# Patient Record
Sex: Female | Born: 1975 | Race: Black or African American | Hispanic: No | Marital: Single | Smoking: Never smoker
Health system: Southern US, Community
[De-identification: ages and names within clinical notes are randomized; demographics above are authoritative.]

## PROBLEM LIST (undated history)

## (undated) ENCOUNTER — Inpatient Hospital Stay (HOSPITAL_COMMUNITY): Payer: Self-pay

## (undated) DIAGNOSIS — E119 Type 2 diabetes mellitus without complications: Secondary | ICD-10-CM

## (undated) DIAGNOSIS — L732 Hidradenitis suppurativa: Secondary | ICD-10-CM

## (undated) DIAGNOSIS — R21 Rash and other nonspecific skin eruption: Secondary | ICD-10-CM

## (undated) DIAGNOSIS — E041 Nontoxic single thyroid nodule: Secondary | ICD-10-CM

## (undated) DIAGNOSIS — M1990 Unspecified osteoarthritis, unspecified site: Secondary | ICD-10-CM

## (undated) DIAGNOSIS — O139 Gestational [pregnancy-induced] hypertension without significant proteinuria, unspecified trimester: Secondary | ICD-10-CM

## (undated) DIAGNOSIS — O24419 Gestational diabetes mellitus in pregnancy, unspecified control: Secondary | ICD-10-CM

## (undated) DIAGNOSIS — K219 Gastro-esophageal reflux disease without esophagitis: Secondary | ICD-10-CM

## (undated) HISTORY — DX: Type 2 diabetes mellitus without complications: E11.9

## (undated) HISTORY — DX: Gestational diabetes mellitus in pregnancy, unspecified control: O24.419

## (undated) HISTORY — DX: Hidradenitis suppurativa: L73.2

---

## 1995-04-29 HISTORY — PX: ELBOW ARTHROSCOPY: SHX614

## 1997-12-08 ENCOUNTER — Ambulatory Visit (HOSPITAL_COMMUNITY): Admission: RE | Admit: 1997-12-08 | Discharge: 1997-12-08 | Payer: Self-pay | Admitting: Family Medicine

## 1998-08-15 ENCOUNTER — Inpatient Hospital Stay (HOSPITAL_COMMUNITY): Admission: AD | Admit: 1998-08-15 | Discharge: 1998-08-15 | Payer: Self-pay | Admitting: Obstetrics & Gynecology

## 1998-11-25 ENCOUNTER — Emergency Department (HOSPITAL_COMMUNITY): Admission: EM | Admit: 1998-11-25 | Discharge: 1998-11-25 | Payer: Self-pay | Admitting: Emergency Medicine

## 2000-04-02 ENCOUNTER — Emergency Department (HOSPITAL_COMMUNITY): Admission: EM | Admit: 2000-04-02 | Discharge: 2000-04-03 | Payer: Self-pay | Admitting: Emergency Medicine

## 2002-04-05 ENCOUNTER — Encounter: Payer: Self-pay | Admitting: Emergency Medicine

## 2002-04-05 ENCOUNTER — Emergency Department (HOSPITAL_COMMUNITY): Admission: EM | Admit: 2002-04-05 | Discharge: 2002-04-05 | Payer: Self-pay | Admitting: *Deleted

## 2004-08-05 ENCOUNTER — Emergency Department (HOSPITAL_COMMUNITY): Admission: EM | Admit: 2004-08-05 | Discharge: 2004-08-05 | Payer: Self-pay | Admitting: Emergency Medicine

## 2005-02-07 ENCOUNTER — Inpatient Hospital Stay (HOSPITAL_COMMUNITY): Admission: AD | Admit: 2005-02-07 | Discharge: 2005-02-07 | Payer: Self-pay | Admitting: Obstetrics and Gynecology

## 2005-02-17 ENCOUNTER — Other Ambulatory Visit: Admission: RE | Admit: 2005-02-17 | Discharge: 2005-02-17 | Payer: Self-pay | Admitting: Obstetrics and Gynecology

## 2005-03-13 ENCOUNTER — Ambulatory Visit (HOSPITAL_COMMUNITY): Admission: RE | Admit: 2005-03-13 | Discharge: 2005-03-13 | Payer: Self-pay | Admitting: Obstetrics and Gynecology

## 2005-08-22 ENCOUNTER — Ambulatory Visit (HOSPITAL_COMMUNITY): Admission: RE | Admit: 2005-08-22 | Discharge: 2005-08-22 | Payer: Self-pay | Admitting: Obstetrics and Gynecology

## 2005-09-03 ENCOUNTER — Inpatient Hospital Stay (HOSPITAL_COMMUNITY): Admission: AD | Admit: 2005-09-03 | Discharge: 2005-09-06 | Payer: Self-pay | Admitting: Obstetrics and Gynecology

## 2009-05-19 ENCOUNTER — Emergency Department (HOSPITAL_BASED_OUTPATIENT_CLINIC_OR_DEPARTMENT_OTHER): Admission: EM | Admit: 2009-05-19 | Discharge: 2009-05-19 | Payer: Self-pay | Admitting: Emergency Medicine

## 2009-05-19 ENCOUNTER — Ambulatory Visit: Payer: Self-pay | Admitting: Diagnostic Radiology

## 2009-11-05 ENCOUNTER — Emergency Department (HOSPITAL_BASED_OUTPATIENT_CLINIC_OR_DEPARTMENT_OTHER)
Admission: EM | Admit: 2009-11-05 | Discharge: 2009-11-05 | Payer: Self-pay | Source: Home / Self Care | Admitting: Emergency Medicine

## 2010-07-15 LAB — CBC
HCT: 41.4 % (ref 36.0–46.0)
Hemoglobin: 13.7 g/dL (ref 12.0–15.0)
MCHC: 33.1 g/dL (ref 30.0–36.0)
Platelets: 277 10*3/uL (ref 150–400)
WBC: 6 10*3/uL (ref 4.0–10.5)

## 2010-07-15 LAB — BASIC METABOLIC PANEL
Chloride: 106 mEq/L (ref 96–112)
GFR calc Af Amer: >60 mL/min (ref >60)
GFR calc non Af Amer: >60 mL/min (ref >60)
Potassium: 3.6 mEq/L (ref 3.5–5.1)

## 2010-07-15 LAB — POCT CARDIAC MARKERS: CKMB, poc: <1 ng/mL — ABNORMAL LOW (ref 1.0–8.0)

## 2010-09-13 NOTE — H&P (Signed)
[name], LINES               ACCOUNT NO.:  [id number]   MEDICAL RECORD NO.:  [id number]          PATIENT TYPE:  MAT   LOCATION:  [facility]                          FACILITY:  [facility]   PHYSICIAN:  Charles A. Delcambre, MDDATE OF BIRTH:  1975-12-01   DATE OF ADMISSION:  DATE OF DISCHARGE:                                HISTORY & PHYSICAL   REASON FOR ADMISSION:  The patient will be admitted on Sep 17, 2005 to  undergo induction of labor at 40 weeks and 2 days estimated gestational age  secondary to gestational diabetes, diet-controlled, with good control, with  favorable cervix.   HISTORY OF PRESENT ILLNESS:  She is a 35 year old para 0-0-1-0, [facility] Sep 15, 2005, noted to be 3 cm dilated in the office today, Sep 02, 2005.  She notes  active fetal movement.  Sugars have been well-controlled, with two hours  postprandials in the 80 to 90 to 100 range and occasional 100-teens and an  occasional 130-140, but these are rare and usually with a dietary  indiscretion.  She has had normal antenatal testing with biophysical once a  week alternating with an NST once a week.  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   PRENATAL LABS:  A positive.  Antibody screen negative.  Sickle trait  negative.  VDRL negative, nonreactive.  Rubella immune.  Hepatitis B surface  antigen negative.  HIV negative.  TSH normal.  CC and chlamydia cultures  negative.  Pap ASCUS.  Quad screen negative.  One-hour Glucola 162.  Abnormal three-hour glucose tolerance test at 102/195/171/125.  Hemoglobin  at 26 weeks was 12.2.  Group B strep positive at 36 weeks.   PAST MEDICAL HISTORY:  None.   PAST SURGICAL HISTORY:  None.   MEDICATIONS:  1.  Valtrex 500 mg per day.  2.  Prenatal vitamins once a day.   ALLERGIES:  PREVACID, REACTION NOT SPECIFIED.   SOCIAL HISTORY:  No tobacco or recreational drug abuse.  The patient is  married.   FAMILY HISTORY:  Heart disease and diabetes in her father.   REVIEW OF SYSTEMS:  She notes active fetal movement today, occasional  contractions.  No rupture of membranes or bleeding.  She had occasional  spotting, none currently.   PHYSICAL EXAMINATION:  GENERAL:  Alert and oriented x3.  No distress.  VITAL SIGNS:  Blood pressure 120/60, weight 249 pounds, respirations 18,  pulse 90.  HEENT:  Grossly within normal limits.  NECK:  Supple, without thyromegaly or adenopathy.  LUNGS:  Clear bilaterally.  BREASTS:  No masses, tenderness, discharge, or nipple changes bilaterally.  ABDOMEN:  Soft, gravid with a 38-cm fundal height.  Estimated fetal weight  3500 g to 3600 g.  PELVIC:  Cervix 3 cm dilated, 75% effaced, -1 station.  Posterior, soft.  EXTREMITIES:  Minimal edema bilaterally.  Nontender.   ASSESSMENT:  Intrauterine pregnancy at 40 weeks and 2 days estimated  gestational age at the time of induction with  gestational diabetes which is  well-controlled with diet, for induction.   PLAN:  Pitocin induction.  Positive for group B strep.  She will be treated  with penicillin 5 mL initially and then 2.5 mL every four hours.  Anticipate  vaginal birth.  Continue Valtrex up through time of delivery.  If any  prodromal symptoms of HSV or outbreak, will proceed with cesarean section  primarily.  All questions were answered, and we will proceed as outlined.      Charles A. Sydnee Cabal, MD  Electronically Signed     CAD/MEDQ  D:  09/02/2005  T:  09/02/2005  Job:  [id number]

## 2011-03-13 ENCOUNTER — Encounter: Payer: Self-pay | Admitting: *Deleted

## 2011-03-13 ENCOUNTER — Emergency Department (HOSPITAL_BASED_OUTPATIENT_CLINIC_OR_DEPARTMENT_OTHER)
Admission: EM | Admit: 2011-03-13 | Discharge: 2011-03-13 | Disposition: A | Discharge status: Home / Self Care | Payer: 59 | Attending: Emergency Medicine | Admitting: Emergency Medicine

## 2011-03-13 ENCOUNTER — Emergency Department (INDEPENDENT_AMBULATORY_CARE_PROVIDER_SITE_OTHER): Payer: 59

## 2011-03-13 DIAGNOSIS — R109 Unspecified abdominal pain: Secondary | ICD-10-CM

## 2011-03-13 DIAGNOSIS — K8020 Calculus of gallbladder without cholecystitis without obstruction: Secondary | ICD-10-CM | POA: Insufficient documentation

## 2011-03-13 DIAGNOSIS — K8050 Calculus of bile duct without cholangitis or cholecystitis without obstruction: Secondary | ICD-10-CM

## 2011-03-13 DIAGNOSIS — R7402 Elevation of levels of lactic acid dehydrogenase (LDH): Secondary | ICD-10-CM | POA: Insufficient documentation

## 2011-03-13 DIAGNOSIS — R1084 Generalized abdominal pain: Secondary | ICD-10-CM | POA: Insufficient documentation

## 2011-03-13 DIAGNOSIS — R748 Abnormal levels of other serum enzymes: Secondary | ICD-10-CM

## 2011-03-13 DIAGNOSIS — M549 Dorsalgia, unspecified: Secondary | ICD-10-CM

## 2011-03-13 DIAGNOSIS — R7401 Elevation of levels of liver transaminase levels: Secondary | ICD-10-CM | POA: Insufficient documentation

## 2011-03-13 DIAGNOSIS — K219 Gastro-esophageal reflux disease without esophagitis: Secondary | ICD-10-CM | POA: Insufficient documentation

## 2011-03-13 LAB — PREGNANCY, URINE: Preg Test, Ur: NEGATIVE

## 2011-03-13 LAB — URINALYSIS, ROUTINE W REFLEX MICROSCOPIC
Bilirubin Urine: NEGATIVE
Leukocytes, UA: NEGATIVE
Nitrite: NEGATIVE
Specific Gravity, Urine: 1.027 (ref 1.005–1.030)

## 2011-03-13 LAB — URINE MICROSCOPIC-ADD ON

## 2011-03-13 LAB — COMPREHENSIVE METABOLIC PANEL
ALT: 168 U/L — ABNORMAL HIGH (ref 0–35)
AST: 268 U/L — ABNORMAL HIGH (ref 0–37)
Alkaline Phosphatase: 122 U/L — ABNORMAL HIGH (ref 39–117)
CO2: 25 mEq/L (ref 19–32)
Calcium: 9.7 mg/dL (ref 8.4–10.5)
Chloride: 102 mEq/L (ref 96–112)
GFR calc Af Amer: >90 mL/min (ref >90)
Glucose, Bld: 116 mg/dL — ABNORMAL HIGH (ref 70–99)
Potassium: 3.8 mEq/L (ref 3.5–5.1)

## 2011-03-13 LAB — DIFFERENTIAL
Eosinophils Absolute: 0 10*3/uL (ref 0.0–0.7)
Lymphs Abs: 1.4 10*3/uL (ref 0.7–4.0)
Monocytes Relative: 8 % (ref 3–12)
Neutrophils Relative %: 80 % — ABNORMAL HIGH (ref 43–77)

## 2011-03-13 LAB — CBC
HCT: 39.4 % (ref 36.0–46.0)
MCHC: 34 g/dL (ref 30.0–36.0)
Platelets: 273 10*3/uL (ref 150–400)
RDW: 12.9 % (ref 11.5–15.5)
WBC: 11.5 10*3/uL — ABNORMAL HIGH (ref 4.0–10.5)

## 2011-03-13 LAB — LIPASE, BLOOD: Lipase: 31 U/L (ref 11–59)

## 2011-03-13 MED ORDER — HYDROCODONE-ACETAMINOPHEN 5-325 MG PO TABS: 1.0000 | ORAL_TABLET | ORAL | Status: AC | PRN

## 2011-03-13 MED ORDER — ONDANSETRON HCL 4 MG/2ML IJ SOLN
4.0000 mg | Freq: Once | INTRAMUSCULAR | Status: AC
  Administered 2011-03-13: 4 mg via INTRAVENOUS
  Filled 2011-03-13: qty 2

## 2011-03-13 MED ORDER — HYDROMORPHONE HCL PF 1 MG/ML IJ SOLN
1.0000 mg | Freq: Once | INTRAMUSCULAR | Status: AC
  Administered 2011-03-13: 1 mg via INTRAVENOUS
  Filled 2011-03-13: qty 1

## 2011-03-13 NOTE — ED Provider Notes (Signed)
History     CSN: [id number] Arrival date & time: 03/13/2011  9:35 AM   First MD Initiated Contact with Patient 03/13/11 1059      Chief Complaint  Patient presents with  . Abdominal Pain    (Consider location/radiation/quality/duration/timing/severity/associated sxs/prior treatment) Patient is a 35 y.o. female presenting with abdominal pain. The history is provided by the patient.  Abdominal Pain The primary symptoms of the illness include abdominal pain.   the pain started suddenly at 0800. It is located in the epigastric area with radiation to the back. Pain was initially sharp, but is now crampy. Pain was initially 10/10, but is now down to 7/10. She took a dose of TUMS with slight relief. She has a history of GERD, but this pain is different from the pain she had with GERD. There is no associated nausea or vomiting. There is no associated constipation or diarrhea. She describes symptoms as severe. The pain started after eating some bran. She does not have any history of abdominal surgery.  Past Medical History  Diagnosis Date  . Acid reflux     No past surgical history on file.  No family history on file.  History  Substance Use Topics  . Smoking status: Not on file  . Smokeless tobacco: Not on file  . Alcohol Use:     OB History    Grav Para Term Preterm Abortions TAB SAB Ect Mult Living                  Review of Systems  Gastrointestinal: Positive for abdominal pain.  All other systems reviewed and are negative.    Allergies  Review of patient's allergies indicates no known allergies.  Home Medications  No current outpatient prescriptions on file.  BP 140/73  Pulse 78  Temp(Src) 97.8 F (36.6 C) (Oral)  Ht 5\' 9"  (1.753 m)  SpO2 100%  Physical Exam  Nursing note and vitals reviewed.  35 year old female who is resting comfortably and is in no acute distress. Vital signs are normal. Head is normocephalic and atraumatic. PERRLA, EOMI. There is no  scleral icterus. Oropharynx is clear. Neck is supple without adenopathy or JVD. Back is nontender there is no CVA tenderness. Lungs are clear without any rales, wheezes, or rhonchi. Heart has a regular rate and rhythm without murmur. Abdomen is slightly obese, and soft. There is moderate tenderness in the epigastrium and right upper quadrant with a plus/minus Murphy sign. There is no hepatosplenomegaly. Peristalsis is diminished but present. Extremities have no cyanosis or edema, full range of motion present. Neurologic: Mental status is normal, cranial nerves are intact, there are no motor or sensory deficits. Psychiatric: No abnormalities of mood or affect.  ED Course  Procedures (including critical care time) Results for orders placed during the hospital encounter of 03/13/11  COMPREHENSIVE METABOLIC PANEL      Component Value Range   Sodium 138  135 - 145 (mEq/L)   Potassium 3.8  3.5 - 5.1 (mEq/L)   Chloride 102  96 - 112 (mEq/L)   CO2 25  19 - 32 (mEq/L)   Glucose, Bld 116 (*) 70 - 99 (mg/dL)   BUN 8  6 - 23 (mg/dL)   Creatinine, Ser 4.09  0.50 - 1.10 (mg/dL)   Calcium 9.7  8.4 - 81.1 (mg/dL)   Total Protein 8.2  6.0 - 8.3 (g/dL)   Albumin 4.1  3.5 - 5.2 (g/dL)   AST 914 (*) 0 - 37 (U/L)  ALT 168 (*) 0 - 35 (U/L)   Alkaline Phosphatase 122 (*) 39 - 117 (U/L)   Total Bilirubin 1.2  0.3 - 1.2 (mg/dL)   GFR calc non Af Amer >90  >90 (mL/min)   GFR calc Af Amer >90  >90 (mL/min)  LIPASE, BLOOD      Component Value Range   Lipase 31  11 - 59 (U/L)  URINALYSIS, ROUTINE W REFLEX MICROSCOPIC      Component Value Range   Color, Urine YELLOW  YELLOW    Appearance CLOUDY (*) CLEAR    Specific Gravity, Urine 1.027  1.005 - 1.030    pH 6.5  5.0 - 8.0    Glucose, UA 500 (*) NEGATIVE (mg/dL)   Hgb urine dipstick SMALL (*) NEGATIVE    Bilirubin Urine NEGATIVE  NEGATIVE    Ketones, ur NEGATIVE  NEGATIVE (mg/dL)   Protein, ur NEGATIVE  NEGATIVE (mg/dL)   Urobilinogen, UA 1.0  0.0 - 1.0  (mg/dL)   Nitrite NEGATIVE  NEGATIVE    Leukocytes, UA NEGATIVE  NEGATIVE   PREGNANCY, URINE      Component Value Range   Preg Test, Ur NEGATIVE    URINE MICROSCOPIC-ADD ON      Component Value Range   Squamous Epithelial / LPF RARE  RARE    WBC, UA 0-2  <3 (WBC/hpf)   RBC / HPF 0-2  <3 (RBC/hpf)   Bacteria, UA FEW (*) RARE   CBC      Component Value Range   WBC 11.5 (*) 4.0 - 10.5 (K/uL)   RBC 4.66  3.87 - 5.11 (MIL/uL)   Hemoglobin 13.4  12.0 - 15.0 (g/dL)   HCT 16.1  09.6 - 04.5 (%)   MCV 84.5  78.0 - 100.0 (fL)   MCH 28.8  26.0 - 34.0 (pg)   MCHC 34.0  30.0 - 36.0 (g/dL)   RDW 40.9  81.1 - 91.4 (%)   Platelets 273  150 - 400 (K/uL)  DIFFERENTIAL      Component Value Range   Neutrophils Relative 80 (*) 43 - 77 (%)   Neutro Abs 9.2 (*) 1.7 - 7.7 (K/uL)   Lymphocytes Relative 12  12 - 46 (%)   Lymphs Abs 1.4  0.7 - 4.0 (K/uL)   Monocytes Relative 8  3 - 12 (%)   Monocytes Absolute 0.9  0.1 - 1.0 (K/uL)   Eosinophils Relative 0  0 - 5 (%)   Eosinophils Absolute 0.0  0.0 - 0.7 (K/uL)   Basophils Relative 0  0 - 1 (%)   Basophils Absolute 0.0  0.0 - 0.1 (K/uL)   US Abdomen Complete  03/13/2011  *RADIOLOGY REPORT*  Clinical Data:  Generalized abdominal pain radiating to the back  COMPLETE ABDOMINAL ULTRASOUND  Comparison:  None  Findings:  Gallbladder:  The gallbladder is contracted.  There is shadowing in the gallbladder with echogenic foci within the gallbladder consistent with multiple small gallstones.  There is no pain over gallbladder with compression.  Common bile duct:  The common bile duct is within upper limits of normal measuring 5 mm in diameter.  Liver:  The liver is somewhat echogenic which may indicate fatty infiltration.  No focal abnormality is seen.  IVC:  The IVC is partially obscured by bowel gas.  Pancreas:  The pancreas also is partially obscured by bowel gas.  Spleen:  The spleen is not well seen and cannot be measured.  Right Kidney:  No hydronephrosis is  seen.  The right kidney measures 9.6 cm sagittally.  Left Kidney:  No hydronephrosis and the left kidney measures 10.1 cm.  Abdominal aorta:  The abdominal aorta is partially obscured by bowel gas.  IMPRESSION:  1.  Contracted gallbladder with echogenicity most consistent with multiple small gallstones.  No other evidence of acute cholecystitis by ultrasound. 2.  Echogenic liver may indicate fatty infiltration. 3.  Bowel gas obscures much of the remainder of the anatomy.  Original Report Authenticated By: Juline Patch, M.D.      Labs Reviewed  CBC  DIFFERENTIAL  COMPREHENSIVE METABOLIC PANEL  LIPASE, BLOOD  URINALYSIS, ROUTINE W REFLEX MICROSCOPIC  PREGNANCY, URINE   No results found.   No diagnosis found.  She got very good pain relief from the allotted and Zofran. She has mild elevation of transaminases, but no evidence of acute cholecystitis. She will be discharged with referral to general surgery. She is advised that symptoms may worsen and may require emergent surgery. Patient expresses understanding.  MDM  Upper abdominal pain pus possibly related to biliary colic. She needs to have laboratory workup to rule out pancreatitis. Ultrasound has been ordered to rule out cholelithiasis.        Dione Booze, MD 03/13/11 1332

## 2011-03-13 NOTE — ED Notes (Signed)
Patient states she developed sudden onset of diffuse abdominal pain while at work approximately 2 hours ago.  Took tums with no relief.  Past history of acid reflux.  #22 G saline lock placed in right ac pta.

## 2011-03-13 NOTE — ED Notes (Signed)
MD at bedside. 

## 2011-03-13 NOTE — ED Notes (Signed)
Per nurse Amy B. I removed patient's I.V. And took last set of vitals.

## 2011-03-26 ENCOUNTER — Ambulatory Visit (INDEPENDENT_AMBULATORY_CARE_PROVIDER_SITE_OTHER): Payer: 59 | Admitting: General Surgery

## 2011-03-31 ENCOUNTER — Ambulatory Visit (INDEPENDENT_AMBULATORY_CARE_PROVIDER_SITE_OTHER): Payer: 59 | Admitting: General Surgery

## 2011-03-31 ENCOUNTER — Encounter (INDEPENDENT_AMBULATORY_CARE_PROVIDER_SITE_OTHER): Payer: Self-pay | Admitting: General Surgery

## 2011-03-31 ENCOUNTER — Other Ambulatory Visit (INDEPENDENT_AMBULATORY_CARE_PROVIDER_SITE_OTHER): Payer: Self-pay | Admitting: General Surgery

## 2011-03-31 VITALS — BP 128/86 | HR 64 | Temp 97.2°F | Resp 16 | Ht 68.5 in | Wt 230.1 lb

## 2011-03-31 DIAGNOSIS — K8020 Calculus of gallbladder without cholecystitis without obstruction: Secondary | ICD-10-CM

## 2011-03-31 NOTE — Progress Notes (Signed)
Patient ID: Yvonne Fisher, female   DOB: [birth year], 35 y.o.   MRN: [id number]  Chief Complaint  Patient presents with  . New Evaluation    eval of GB with stones     HPI Yvonne Fisher is a 35 y.o. female.    She is referred to me by Dr. Preston Fleeting in the emergency department.  About 3 weeks ago she was at work in the early morning after eating breakfast, she developed epigastric pain which was severe. She became diaphoretic. The pain radiated to her back. She denied nausea vomiting fever chills or diarrhea. She went to the emergency room. Dr. Preston Fleeting work her up. She is found to have a white blood cell count of 11,500, AST 268, ALT 168, total bilirubin 1.2, lipase 21, urine pregnancy test negative. Ultrasound showed a contracted gallbladder with stones, fatty infiltration of the liver, and a normal common bile duct.  She may have had one similar episode previously and she has had one very light episodes since.  She works in a [employer] and has to lift heavy objects. She takes no medications. She is allergic to Prevacid which causes times. There was surgery she's had his elbow operation. HPI  Past Medical History  Diagnosis Date  . Acid reflux     Past Surgical History  Procedure Date  . Elbow arthroscopy 1997    right elbow    History reviewed. No pertinent family history.  Social History History  Substance Use Topics  . Smoking status: Not on file  . Smokeless tobacco: Never Used  . Alcohol Use: No    Allergies  Allergen Reactions  . Prevacid     Hives      No current outpatient prescriptions on file.    Review of Systems Review of Systems  Constitutional: Negative for fever, chills and unexpected weight change.  HENT: Negative for hearing loss, congestion, sore throat, trouble swallowing and voice change.   Eyes: Negative for visual disturbance.  Respiratory: Positive for wheezing. Negative for cough.   Cardiovascular: Negative for chest pain, palpitations and  leg swelling.  Gastrointestinal: Positive for abdominal pain and constipation. Negative for nausea, vomiting, diarrhea, blood in stool, abdominal distention and anal bleeding.  Genitourinary: Negative for hematuria, vaginal bleeding and difficulty urinating.  Musculoskeletal: Negative for arthralgias.  Skin: Negative for rash and wound.  Neurological: Negative for seizures, syncope and headaches.  Hematological: Negative for adenopathy. Does not bruise/bleed easily.  Psychiatric/Behavioral: Negative for confusion.    Blood pressure 128/86, pulse 64, temperature 97.2 F (36.2 C), temperature source Temporal, resp. rate 16, height 5' 8.5" (1.74 m), weight 230 lb 2 oz (104.384 kg).  Physical Exam Physical Exam  Constitutional: She is oriented to person, place, and time. She appears well-developed and well-nourished. No distress.       Obese. Weight 230.  HENT:  Head: Normocephalic and atraumatic.  Nose: Nose normal.  Mouth/Throat: No oropharyngeal exudate.  Eyes: Conjunctivae and EOM are normal. Pupils are equal, round, and reactive to light. Left eye exhibits no discharge. No scleral icterus.  Neck: Neck supple. No JVD present. No tracheal deviation present. No thyromegaly present.  Cardiovascular: Normal rate, regular rhythm, normal heart sounds and intact distal pulses.   No murmur heard. Pulmonary/Chest: Effort normal and breath sounds normal. No respiratory distress. She has no wheezes. She has no rales. She exhibits no tenderness.  Abdominal: Soft. Bowel sounds are normal. She exhibits no distension and no mass. There is no tenderness. There is  no rebound and no guarding.       Mild subjective RUQ tenderness.   Musculoskeletal: She exhibits no edema and no tenderness.  Lymphadenopathy:    She has no cervical adenopathy.  Neurological: She is alert and oriented to person, place, and time. She exhibits normal muscle tone. Coordination normal.  Skin: Skin is warm. No rash noted. She  is not diaphoretic. No erythema. No pallor.  Psychiatric: She has a normal mood and affect. Her behavior is normal. Judgment and thought content normal.    Data Reviewed I have reviewed reviewed her emergency department records, labs, and x-ray reports.  Assessment    Chronic cholecystitis with cholelithiasis. Recent episode of severe biliary colic.  Elevated liver function tests may be due to inflammation or may be due to common bile duct stones.  Morbid obesity.  Past history GERD, currently not symptomatic.    Plan    Schedule for laparoscopic cholecystectomy with cholangiogram.  I have discussed the indications and details of surgery with her. Risks and complications have been outlined, including but not limited to bleeding, infection, conversion to open laparotomy, injury to the main bile duct or intestine was measured reconstructive surgery, wound problems, cardiac, pulmonary, and thromboembolic problems. The goals of the surgery are discussed and she understands the probable likelihood of  resolution of her symptoms.She understands all these issues. All questions are answered. She agrees with this plan.       Pasco Marchitto M 03/31/2011, 9:05 AM

## 2011-03-31 NOTE — Patient Instructions (Signed)
You have gallstones and the recent episode of pain and elevated liver function test are due to your gallbladder. You will be scheduled for a laparoscopic cholecystectomy with cholangiogram.  Laparoscopic Cholecystectomy Laparoscopic cholecystectomy is surgery to remove the gallbladder. The gallbladder is located slightly to the right of center in the abdomen, behind the liver. It is a concentrating and storage sac for the bile produced in the liver. Bile aids in the digestion and absorption of fats. Gallbladder disease (cholecystitis) is an inflammation of your gallbladder. This condition is usually caused by a buildup of gallstones (cholelithiasis) in your gallbladder. Gallstones can block the flow of bile, resulting in inflammation and pain. In severe cases, emergency surgery may be required. When emergency surgery is not required, you will have time to prepare for the procedure. Laparoscopic surgery is an alternative to open surgery. Laparoscopic surgery usually has a shorter recovery time. Your common bile duct may also need to be examined and explored. Your caregiver will discuss this with you if he or she feels this should be done. If stones are found in the common bile duct, they may be removed. LET YOUR CAREGIVER KNOW ABOUT:  Allergies to food or medicine.   Medicines taken, including vitamins, herbs, eyedrops, over-the-counter medicines, and creams.   Use of steroids (by mouth or creams).   Previous problems with anesthetics or numbing medicines.   History of bleeding problems or blood clots.   Previous surgery.   Other health problems, including diabetes and kidney problems.   Possibility of pregnancy, if this applies.  RISKS AND COMPLICATIONS All surgery is associated with risks. Some problems that may occur following this procedure include:  Infection.   Damage to the common bile duct, nerves, arteries, veins, or other internal organs such as the stomach or intestines.    Bleeding.   A stone may remain in the common bile duct.  BEFORE THE PROCEDURE  Do not take aspirin for 3 days prior to surgery or blood thinners for 1 week prior to surgery.   Do not eat or drink anything after midnight the night before surgery.   Let your caregiver know if you develop a cold or other infectious problem prior to surgery.   You should be present 60 minutes before the procedure or as directed.  PROCEDURE  You will be given medicine that makes you sleep (general anesthetic). When you are asleep, your surgeon will make several small cuts (incisions) in your abdomen. One of these incisions is used to insert a small, lighted scope (laparoscope) into the abdomen. The laparoscope helps the surgeon see into your abdomen. Carbon dioxide gas will be pumped into your abdomen. The gas allows more room for the surgeon to perform your surgery. Other operating instruments are inserted through the other incisions. Laparoscopic procedures may not be appropriate when:  There is major scarring from previous surgery.   The gallbladder is extremely inflamed.   There are bleeding disorders or unexpected cirrhosis of the liver.   A pregnancy is near term.   Other conditions make the laparoscopic procedure impossible.  If your surgeon feels it is not safe to continue with a laparoscopic procedure, he or she will perform an open abdominal procedure. In this case, the surgeon will make an incision to open the abdomen. This gives the surgeon a larger view and field to work within. This may allow the surgeon to perform procedures that sometimes cannot be performed with a laparoscope alone. Open surgery has a longer recovery time.  AFTER THE PROCEDURE  You will be taken to the recovery area where a nurse will watch and check your progress.   You may be allowed to go home the same day.   Do not resume physical activities until directed by your caregiver.   You may resume a normal diet and  activities as directed.  Document Released: 04/14/2005 Document Revised: 12/25/2010 Document Reviewed: 09/27/2010 [facility] Patient Information 2012 [place], [place].

## 2011-04-07 ENCOUNTER — Encounter (HOSPITAL_COMMUNITY): Payer: Self-pay | Admitting: Pharmacy Technician

## 2011-04-09 ENCOUNTER — Encounter (HOSPITAL_COMMUNITY): Payer: Self-pay

## 2011-04-09 ENCOUNTER — Encounter (HOSPITAL_COMMUNITY)
Admission: RE | Admit: 2011-04-09 | Discharge: 2011-04-09 | Disposition: A | Discharge status: Home / Self Care | Payer: 59 | Source: Ambulatory Visit | Attending: General Surgery | Admitting: General Surgery

## 2011-04-09 DIAGNOSIS — K8020 Calculus of gallbladder without cholecystitis without obstruction: Secondary | ICD-10-CM

## 2011-04-09 HISTORY — DX: Unspecified osteoarthritis, unspecified site: M19.90

## 2011-04-09 LAB — CBC
MCH: 28.7 pg (ref 26.0–34.0)
MCHC: 33.6 g/dL (ref 30.0–36.0)
RDW: 12.9 % (ref 11.5–15.5)

## 2011-04-09 LAB — DIFFERENTIAL
Basophils Absolute: 0 10*3/uL (ref 0.0–0.1)
Lymphs Abs: 2.2 10*3/uL (ref 0.7–4.0)
Monocytes Absolute: 0.5 10*3/uL (ref 0.1–1.0)
Neutrophils Relative %: 50 % (ref 43–77)

## 2011-04-09 LAB — COMPREHENSIVE METABOLIC PANEL
BUN: 9 mg/dL (ref 6–23)
CO2: 28 mEq/L (ref 19–32)
Chloride: 104 mEq/L (ref 96–112)
Creatinine, Ser: 0.73 mg/dL (ref 0.50–1.10)
GFR calc non Af Amer: >90 mL/min (ref >90)
Glucose, Bld: 124 mg/dL — ABNORMAL HIGH (ref 70–99)
Total Bilirubin: 0.2 mg/dL — ABNORMAL LOW (ref 0.3–1.2)

## 2011-04-09 LAB — SURGICAL PCR SCREEN
MRSA, PCR: NEGATIVE
Staphylococcus aureus: POSITIVE — AB

## 2011-04-09 LAB — HCG, SERUM, QUALITATIVE: Preg, Serum: NEGATIVE

## 2011-04-09 NOTE — Patient Instructions (Signed)
20 Yvonne Fisher  04/09/2011   Your procedure is scheduled on:  04/15/11 1130am-1256 pm  Report to [facility] at 0930 AM.  Call this number if you have problems the morning of surgery: [phone]   Remember:   Do not eat food:After Midnight.  May have clear liquids:until Midnight .  Clear liquids include soda, tea, black coffee, apple or grape juice, broth.  Take these medicines the morning of surgery with A SIP OF WATER:    Do not wear jewelry, make-up or nail polish.  Do not wear lotions, powders, or perfumes.   Do not shave 48 hours prior to surgery.  Do not bring valuables to the hospital.  Contacts, dentures or bridgework may not be worn into surgery.  Leave suitcase in the car. After surgery it may be brought to your room.  For patients admitted to the hospital, checkout time is 11:00 AM the day of discharge.      Special Instructions: CHG Shower Use Special Wash: 1/2 bottle night before surgery and 1/2 bottle morning of surgery.   Please read over the following fact sheets that you were given: MRSA Information, coughign and deep breathing exercises and leg exercises

## 2011-04-14 NOTE — H&P (Signed)
Yvonne Fisher   03/31/2011 8:45 AM Office Visit  MRN: [id number]   Description: 35 year old female  Provider: Ernestene Mention, MD  Department: [facility]        Diagnoses     Gallstones   - Primary    574.20      Reason for Visit     New Evaluation    eval of GB with stones         Vitals - Last Recorded       BP Pulse Temp(Src) Resp Ht Wt    128/86  64  97.2 F (36.2 C) (Temporal)  16  5' 8.5" (1.74 m)  230 lb 2 oz (104.384 kg)          BMI    34.48 kg/m2                 Progress Notes     Ernestene Mention, MD  03/31/2011  9:12 AM  Signed Patient ID: Yvonne Fisher, female   DOB: [birth year], 35 y.o.   MRN: [id number]    Chief Complaint   Patient presents with   .  New Evaluation       eval of GB with stones       HPI Yvonne Fisher is a 35 y.o. female.     She is referred to me by Dr. Preston Fleeting in the emergency department.   About 3 weeks ago she was at work in the early morning after eating breakfast, she developed epigastric pain which was severe. She became diaphoretic. The pain radiated to her back. She denied nausea vomiting fever chills or diarrhea. She went to the emergency room. Dr. Preston Fleeting work her up. She is found to have a white blood cell count of 11,500, AST 268, ALT 168, total bilirubin 1.2, lipase 21, urine pregnancy test negative. Ultrasound showed a contracted gallbladder with stones, fatty infiltration of the liver, and a normal common bile duct.   She may have had one similar episode previously and she has had one very light episodes since.   She works in a [employer] and has to lift heavy objects. She takes no medications. She is allergic to Prevacid which causes times. There was surgery she's had his elbow operation.    Past Medical History   Diagnosis  Date   .  Acid reflux         Past Surgical History   Procedure  Date   .  Elbow arthroscopy  1997       right elbow      History reviewed. No pertinent family  history.   Social History History   Substance Use Topics   .  Smoking status:  Not on file   .  Smokeless tobacco:  Never Used   .  Alcohol Use:  No       Allergies   Allergen  Reactions   .  Prevacid         Hives           No current outpatient prescriptions on file.      Review of Systems Review of Systems  Constitutional: Negative for fever, chills and unexpected weight change.  HENT: Negative for hearing loss, congestion, sore throat, trouble swallowing and voice change.   Eyes: Negative for visual disturbance.  Respiratory: Positive for wheezing. Negative for cough.   Cardiovascular: Negative for chest pain, palpitations and leg swelling.  Gastrointestinal: Positive for abdominal pain and  constipation. Negative for nausea, vomiting, diarrhea, blood in stool, abdominal distention and anal bleeding.  Genitourinary: Negative for hematuria, vaginal bleeding and difficulty urinating.  Musculoskeletal: Negative for arthralgias.  Skin: Negative for rash and wound.  Neurological: Negative for seizures, syncope and headaches.  Hematological: Negative for adenopathy. Does not bruise/bleed easily.  Psychiatric/Behavioral: Negative for confusion.    Blood pressure 128/86, pulse 64, temperature 97.2 F (36.2 C), temperature source Temporal, resp. rate 16, height 5' 8.5" (1.74 m), weight 230 lb 2 oz (104.384 kg).   Physical Exam Physical Exam  Constitutional: She is oriented to person, place, and time. She appears well-developed and well-nourished. No distress.       Obese. Weight 230.  HENT:   Head: Normocephalic and atraumatic.   Nose: Nose normal.   Mouth/Throat: No oropharyngeal exudate.  Eyes: Conjunctivae and EOM are normal. Pupils are equal, round, and reactive to light. Left eye exhibits no discharge. No scleral icterus.  Neck: Neck supple. No JVD present. No tracheal deviation present. No thyromegaly present.  Cardiovascular: Normal rate, regular rhythm, normal  heart sounds and intact distal pulses.    No murmur heard. Pulmonary/Chest: Effort normal and breath sounds normal. No respiratory distress. She has no wheezes. She has no rales. She exhibits no tenderness.  Abdominal: Soft. Bowel sounds are normal. She exhibits no distension and no mass. There is no tenderness. There is no rebound and no guarding.       Mild subjective RUQ tenderness.   Musculoskeletal: She exhibits no edema and no tenderness.  Lymphadenopathy:    She has no cervical adenopathy.  Neurological: She is alert and oriented to person, place, and time. She exhibits normal muscle tone. Coordination normal.  Skin: Skin is warm. No rash noted. She is not diaphoretic. No erythema. No pallor.  Psychiatric: She has a normal mood and affect. Her behavior is normal. Judgment and thought content normal.    Data Reviewed I have reviewed reviewed her emergency department records, labs, and x-ray reports.   Assessment Chronic cholecystitis with cholelithiasis. Recent episode of severe biliary colic.   Elevated liver function tests may be due to inflammation or may be due to common bile duct stones.   Morbid obesity.   Past history GERD, currently not symptomatic.   Plan Schedule for laparoscopic cholecystectomy with cholangiogram.   I have discussed the indications and details of surgery with her. Risks and complications have been outlined, including but not limited to bleeding, infection, conversion to open laparotomy, injury to the main bile duct or intestine was measured reconstructive surgery, wound problems, cardiac, pulmonary, and thromboembolic problems. The goals of the surgery are discussed and she understands the probable likelihood of  resolution of her symptoms.She understands all these issues. All questions are answered. She agrees with this plan.  Gee Habig M 03/31/2011, 9:05 AM    Patient Instructions     You have gallstones and the recent episode of pain  and elevated liver function test are due to your gallbladder. You will be scheduled for a laparoscopic cholecystectomy with cholangiogram.   Laparoscopic Cholecystectomy  Laparoscopic cholecystectomy is surgery to remove the gallbladder. The gallbladder is located slightly to the right of center in the abdomen, behind the liver. It is a concentrating and storage sac for the bile produced in the liver. Bile aids in the digestion and absorption of fats. Gallbladder disease (cholecystitis) is an inflammation of your gallbladder. This condition is usually caused by a buildup of gallstones (cholelithiasis) in your gallbladder. Gallstones  can block the flow of bile, resulting in inflammation and pain. In severe cases, emergency surgery may be required. When emergency surgery is not required, you will have time to prepare for the procedure. Laparoscopic surgery is an alternative to open surgery. Laparoscopic surgery usually has a shorter recovery time. Your common bile duct may also need to be examined and explored. Your caregiver will discuss this with you if he or she feels this should be done. If stones are found in the common bile duct, they may be removed. LET YOUR CAREGIVER KNOW ABOUT: Allergies to food or medicine.   Medicines taken, including vitamins, herbs, eyedrops, over-the-counter medicines, and creams.   Use of steroids (by mouth or creams).   Previous problems with anesthetics or numbing medicines.   History of bleeding problems or blood clots.   Previous surgery.   Other health problems, including diabetes and kidney problems.   Possibility of pregnancy, if this applies.  RISKS AND COMPLICATIONS  All surgery is associated with risks. Some problems that may occur following this procedure include: Infection.   Damage to the common bile duct, nerves, arteries, veins, or other internal organs such as the stomach or intestines.   Bleeding.   A stone may remain in the common bile duct.   BEFORE THE PROCEDURE Do not take aspirin for 3 days prior to surgery or blood thinners for 1 week prior to surgery.   Do not eat or drink anything after midnight the night before surgery.   Let your caregiver know if you develop a cold or other infectious problem prior to surgery.   You should be present 60 minutes before the procedure or as directed.  PROCEDURE   You will be given medicine that makes you sleep (general anesthetic). When you are asleep, your surgeon will make several small cuts (incisions) in your abdomen. One of these incisions is used to insert a small, lighted scope (laparoscope) into the abdomen. The laparoscope helps the surgeon see into your abdomen. Carbon dioxide gas will be pumped into your abdomen. The gas allows more room for the surgeon to perform your surgery. Other operating instruments are inserted through the other incisions. Laparoscopic procedures may not be appropriate when: There is major scarring from previous surgery.   The gallbladder is extremely inflamed.   There are bleeding disorders or unexpected cirrhosis of the liver.   A pregnancy is near term.   Other conditions make the laparoscopic procedure impossible.  If your surgeon feels it is not safe to continue with a laparoscopic procedure, he or she will perform an open abdominal procedure. In this case, the surgeon will make an incision to open the abdomen. This gives the surgeon a larger view and field to work within. This may allow the surgeon to perform procedures that sometimes cannot be performed with a laparoscope alone. Open surgery has a longer recovery time. AFTER THE PROCEDURE You will be taken to the recovery area where a nurse will watch and check your progress.   You may be allowed to go home the same day.   Do not resume physical activities until directed by your caregiver.   You may resume a normal diet and activities as directed.  Document Released: 04/14/2005 Document Revised:  12/25/2010 Document Reviewed: 09/27/2010 [facility] Patient Information 2012 [place], [place].               Referring Provider          Dione Booze, MD

## 2011-04-15 ENCOUNTER — Ambulatory Visit (HOSPITAL_COMMUNITY): Payer: 59 | Admitting: Anesthesiology

## 2011-04-15 ENCOUNTER — Ambulatory Visit (HOSPITAL_COMMUNITY)
Admission: RE | Admit: 2011-04-15 | Discharge: 2011-04-16 | Disposition: A | Discharge status: Home / Self Care | Payer: 59 | Source: Ambulatory Visit | Attending: General Surgery | Admitting: General Surgery

## 2011-04-15 ENCOUNTER — Encounter (HOSPITAL_COMMUNITY): Payer: Self-pay | Admitting: Anesthesiology

## 2011-04-15 ENCOUNTER — Other Ambulatory Visit (INDEPENDENT_AMBULATORY_CARE_PROVIDER_SITE_OTHER): Payer: Self-pay | Admitting: General Surgery

## 2011-04-15 ENCOUNTER — Encounter (HOSPITAL_COMMUNITY)
Admission: RE | Disposition: A | Discharge status: Home / Self Care | Payer: Self-pay | Source: Ambulatory Visit | Attending: General Surgery

## 2011-04-15 ENCOUNTER — Encounter (HOSPITAL_COMMUNITY): Payer: Self-pay | Admitting: *Deleted

## 2011-04-15 ENCOUNTER — Ambulatory Visit (HOSPITAL_COMMUNITY): Payer: 59

## 2011-04-15 DIAGNOSIS — K8010 Calculus of gallbladder with chronic cholecystitis without obstruction: Secondary | ICD-10-CM

## 2011-04-15 DIAGNOSIS — R7989 Other specified abnormal findings of blood chemistry: Secondary | ICD-10-CM | POA: Insufficient documentation

## 2011-04-15 DIAGNOSIS — Z01812 Encounter for preprocedural laboratory examination: Secondary | ICD-10-CM | POA: Insufficient documentation

## 2011-04-15 DIAGNOSIS — K8020 Calculus of gallbladder without cholecystitis without obstruction: Secondary | ICD-10-CM

## 2011-04-15 HISTORY — PX: CHOLECYSTECTOMY: SHX55

## 2011-04-15 SURGERY — LAPAROSCOPIC CHOLECYSTECTOMY WITH INTRAOPERATIVE CHOLANGIOGRAM
Anesthesia: General | Site: Abdomen | Wound class: Clean Contaminated

## 2011-04-15 MED ORDER — GLYCOPYRROLATE 0.2 MG/ML IJ SOLN
INTRAMUSCULAR | Status: DC | PRN
  Administered 2011-04-15: .5 mg via INTRAVENOUS

## 2011-04-15 MED ORDER — MORPHINE SULFATE 2 MG/ML IJ SOLN
INTRAMUSCULAR | Status: AC
  Filled 2011-04-15: qty 1

## 2011-04-15 MED ORDER — HEPARIN SODIUM (PORCINE) 5000 UNIT/ML IJ SOLN
5000.0000 [IU] | Freq: Three times a day (TID) | INTRAMUSCULAR | Status: DC
  Administered 2011-04-16: 5000 [IU] via SUBCUTANEOUS
  Filled 2011-04-15 (×3): qty 1

## 2011-04-15 MED ORDER — ACETAMINOPHEN 10 MG/ML IV SOLN
INTRAVENOUS | Status: AC
  Filled 2011-04-15: qty 100

## 2011-04-15 MED ORDER — CEFAZOLIN SODIUM 1-5 GM-% IV SOLN
INTRAVENOUS | Status: DC | PRN
  Administered 2011-04-15: 2 g via INTRAVENOUS

## 2011-04-15 MED ORDER — CISATRACURIUM BESYLATE 2 MG/ML IV SOLN
INTRAVENOUS | Status: DC | PRN
  Administered 2011-04-15: 6 mg via INTRAVENOUS
  Administered 2011-04-15: 2 mg via INTRAVENOUS

## 2011-04-15 MED ORDER — BUPIVACAINE-EPINEPHRINE (PF) 0.5% -1:200000 IJ SOLN
INTRAMUSCULAR | Status: AC
  Filled 2011-04-15: qty 10

## 2011-04-15 MED ORDER — PROPOFOL 10 MG/ML IV EMUL
INTRAVENOUS | Status: DC | PRN
  Administered 2011-04-15: 140 mg via INTRAVENOUS
  Administered 2011-04-15: 60 mg via INTRAVENOUS

## 2011-04-15 MED ORDER — ONDANSETRON HCL 4 MG/2ML IJ SOLN
4.0000 mg | Freq: Four times a day (QID) | INTRAMUSCULAR | Status: DC | PRN
  Administered 2011-04-16: 4 mg via INTRAVENOUS
  Filled 2011-04-15: qty 2

## 2011-04-15 MED ORDER — FENTANYL CITRATE 0.05 MG/ML IJ SOLN
INTRAMUSCULAR | Status: AC
  Filled 2011-04-15: qty 2

## 2011-04-15 MED ORDER — NEOSTIGMINE METHYLSULFATE 1 MG/ML IJ SOLN
INTRAMUSCULAR | Status: DC | PRN
  Administered 2011-04-15: 2.5 mg via INTRAVENOUS

## 2011-04-15 MED ORDER — IOHEXOL 300 MG/ML  SOLN
INTRAMUSCULAR | Status: DC | PRN
  Administered 2011-04-15: 30 mL via INTRAVENOUS

## 2011-04-15 MED ORDER — LIDOCAINE HCL (CARDIAC) 20 MG/ML IV SOLN
INTRAVENOUS | Status: DC | PRN
  Administered 2011-04-15 (×2): 100 mg via INTRAVENOUS

## 2011-04-15 MED ORDER — ONDANSETRON HCL 4 MG/2ML IJ SOLN
INTRAMUSCULAR | Status: DC | PRN
  Administered 2011-04-15: 4 mg via INTRAVENOUS

## 2011-04-15 MED ORDER — HYDROCODONE-ACETAMINOPHEN 5-325 MG PO TABS: 1.0000 | ORAL_TABLET | ORAL | Status: DC | PRN

## 2011-04-15 MED ORDER — CEFAZOLIN SODIUM-DEXTROSE 2-3 GM-% IV SOLR: 2.0000 g | INTRAVENOUS | Status: DC

## 2011-04-15 MED ORDER — IOHEXOL 300 MG/ML  SOLN
INTRAMUSCULAR | Status: AC
  Filled 2011-04-15: qty 1

## 2011-04-15 MED ORDER — INFLUENZA VIRUS VACC SPLIT PF IM SUSP
0.5000 mL | INTRAMUSCULAR | Status: AC
Start: 2011-04-16 — End: 2011-04-16
  Administered 2011-04-16: 0.5 mL via INTRAMUSCULAR
  Filled 2011-04-15: qty 0.5

## 2011-04-15 MED ORDER — LACTATED RINGERS IV SOLN
INTRAVENOUS | Status: DC | PRN
  Administered 2011-04-15 (×2): via INTRAVENOUS

## 2011-04-15 MED ORDER — BUPIVACAINE-EPINEPHRINE 0.5% -1:200000 IJ SOLN
INTRAMUSCULAR | Status: DC | PRN
  Administered 2011-04-15: 15 mL

## 2011-04-15 MED ORDER — LACTATED RINGERS IV SOLN
INTRAVENOUS | Status: DC | PRN
  Administered 2011-04-15: 1000 mL via INTRAVENOUS

## 2011-04-15 MED ORDER — LACTATED RINGERS IV SOLN: INTRAVENOUS | Status: DC

## 2011-04-15 MED ORDER — FENTANYL CITRATE 0.05 MG/ML IJ SOLN
INTRAMUSCULAR | Status: DC | PRN
  Administered 2011-04-15: 2 ug via INTRAVENOUS

## 2011-04-15 MED ORDER — HEPARIN SODIUM (PORCINE) 5000 UNIT/ML IJ SOLN
5000.0000 [IU] | Freq: Once | INTRAMUSCULAR | Status: AC
  Administered 2011-04-15: 5000 [IU] via SUBCUTANEOUS

## 2011-04-15 MED ORDER — CEFAZOLIN SODIUM 1-5 GM-% IV SOLN
INTRAVENOUS | Status: AC
  Filled 2011-04-15: qty 100

## 2011-04-15 MED ORDER — MORPHINE SULFATE 2 MG/ML IJ SOLN
2.0000 mg | INTRAMUSCULAR | Status: DC | PRN
  Administered 2011-04-15 – 2011-04-16 (×4): 2 mg via INTRAVENOUS
  Filled 2011-04-15 (×3): qty 1

## 2011-04-15 MED ORDER — POTASSIUM CHLORIDE IN NACL 40-0.9 MEQ/L-% IV SOLN
INTRAVENOUS | Status: DC
Start: 2011-04-15 — End: 2011-04-16
  Administered 2011-04-15 – 2011-04-16 (×2): via INTRAVENOUS
  Filled 2011-04-15 (×6): qty 1000

## 2011-04-15 MED ORDER — PROMETHAZINE HCL 25 MG/ML IJ SOLN: 6.2500 mg | INTRAMUSCULAR | Status: DC | PRN

## 2011-04-15 MED ORDER — ACETAMINOPHEN 10 MG/ML IV SOLN
INTRAVENOUS | Status: DC | PRN
  Administered 2011-04-15: 1000 mg via INTRAVENOUS

## 2011-04-15 MED ORDER — LACTATED RINGERS IV SOLN
INTRAVENOUS | Status: DC
  Administered 2011-04-15: 1000 mL via INTRAVENOUS

## 2011-04-15 MED ORDER — ONDANSETRON HCL 4 MG PO TABS: 4.0000 mg | ORAL_TABLET | Freq: Four times a day (QID) | ORAL | Status: DC | PRN

## 2011-04-15 MED ORDER — FENTANYL CITRATE 0.05 MG/ML IJ SOLN
25.0000 ug | INTRAMUSCULAR | Status: AC | PRN
  Administered 2011-04-15: 50 ug via INTRAVENOUS
  Administered 2011-04-15 (×2): 25 ug via INTRAVENOUS
  Administered 2011-04-15: 50 ug via INTRAVENOUS
  Administered 2011-04-15 (×2): 25 ug via INTRAVENOUS

## 2011-04-15 MED ORDER — MIDAZOLAM HCL 5 MG/5ML IJ SOLN
INTRAMUSCULAR | Status: DC | PRN
  Administered 2011-04-15: 2 mg via INTRAVENOUS

## 2011-04-15 SURGICAL SUPPLY — 37 items
ADH SKN CLS APL DERMABOND .7 (GAUZE/BANDAGES/DRESSINGS) ×1
APL SKNCLS STERI-STRIP NONHPOA (GAUZE/BANDAGES/DRESSINGS) ×1
APPLIER CLIP ROT 10 11.4 M/L (STAPLE) ×2
APR CLP MED LRG 11.4X10 (STAPLE) ×1
BAG SPEC RTRVL LRG 6X4 10 (ENDOMECHANICALS)
BENZOIN TINCTURE PRP APPL 2/3 (GAUZE/BANDAGES/DRESSINGS) ×2 IMPLANT
CANISTER SUCTION 2500CC (MISCELLANEOUS) ×2 IMPLANT
CLIP APPLIE ROT 10 11.4 M/L (STAPLE) ×1 IMPLANT
CLOTH BEACON ORANGE TIMEOUT ST (SAFETY) ×2 IMPLANT
COVER MAYO STAND STRL (DRAPES) ×3 IMPLANT
DECANTER SPIKE VIAL GLASS SM (MISCELLANEOUS) ×2 IMPLANT
DERMABOND ADVANCED (GAUZE/BANDAGES/DRESSINGS) ×1
DERMABOND ADVANCED .7 DNX12 (GAUZE/BANDAGES/DRESSINGS) ×1 IMPLANT
DRAPE C-ARM 42X72 X-RAY (DRAPES) ×2 IMPLANT
DRAPE LAPAROSCOPIC ABDOMINAL (DRAPES) ×2 IMPLANT
ELECT REM PT RETURN 9FT ADLT (ELECTROSURGICAL) ×2
ELECTRODE REM PT RTRN 9FT ADLT (ELECTROSURGICAL) ×1 IMPLANT
GLOVE BIOGEL PI IND STRL 7.0 (GLOVE) ×1 IMPLANT
GLOVE BIOGEL PI INDICATOR 7.0 (GLOVE) ×1
GLOVE EUDERMIC 7 POWDERFREE (GLOVE) ×2 IMPLANT
GOWN STRL NON-REIN LRG LVL3 (GOWN DISPOSABLE) ×3 IMPLANT
GOWN STRL REIN XL XLG (GOWN DISPOSABLE) ×4 IMPLANT
HEMOSTAT SURGICEL 4X8 (HEMOSTASIS) IMPLANT
KIT BASIN OR (CUSTOM PROCEDURE TRAY) ×2 IMPLANT
NS IRRIG 1000ML POUR BTL (IV SOLUTION) ×2 IMPLANT
POUCH SPECIMEN RETRIEVAL 10MM (ENDOMECHANICALS) IMPLANT
SET CHOLANGIOGRAPH MIX (MISCELLANEOUS) ×2 IMPLANT
SET IRRIG TUBING LAPAROSCOPIC (IRRIGATION / IRRIGATOR) ×2 IMPLANT
SOLUTION ANTI FOG 6CC (MISCELLANEOUS) ×2 IMPLANT
STRIP CLOSURE SKIN 1/2X4 (GAUZE/BANDAGES/DRESSINGS) ×2 IMPLANT
SUT MNCRL AB 4-0 PS2 18 (SUTURE) ×2 IMPLANT
TOWEL OR 17X26 10 PK STRL BLUE (TOWEL DISPOSABLE) ×6 IMPLANT
TRAY LAP CHOLE (CUSTOM PROCEDURE TRAY) ×2 IMPLANT
TROCAR BLADELESS OPT 5 75 (ENDOMECHANICALS) IMPLANT
TROCAR XCEL BLUNT TIP 100MML (ENDOMECHANICALS) ×2 IMPLANT
TROCAR XCEL NON-BLD 11X100MML (ENDOMECHANICALS) IMPLANT
TUBING INSUFFLATION 10FT LAP (TUBING) ×2 IMPLANT

## 2011-04-15 NOTE — Transfer of Care (Signed)
Immediate Anesthesia Transfer of Care Note  Patient: Yvonne Fisher  Procedure(s) Performed:  LAPAROSCOPIC CHOLECYSTECTOMY WITH INTRAOPERATIVE CHOLANGIOGRAM - laparoscopic cholecystectomy with cholangiogram  Patient Location: PACU  Anesthesia Type: General  Level of Consciousness: awake, alert  and oriented  Airway & Oxygen Therapy: Patient Spontanous Breathing and Patient connected to face mask oxygen  Post-op Assessment: Report given to PACU RN  Post vital signs: Reviewed and stable  Complications: No apparent anesthesia complications

## 2011-04-15 NOTE — Anesthesia Preprocedure Evaluation (Signed)
Anesthesia Evaluation  Patient identified by MRN, date of birth, ID band Patient awake    Reviewed: Allergy & Precautions, H&P , NPO status , Patient's Chart, lab work & pertinent test results  Airway Mallampati: II TM Distance: >3 FB Neck ROM: full    Dental No notable dental hx. (+) Teeth Intact and Chipped   Pulmonary neg pulmonary ROS,  clear to auscultation  Pulmonary exam normal       Cardiovascular Exercise Tolerance: Good neg cardio ROS regular Normal    Neuro/Psych Negative Neurological ROS  Negative Psych ROS   GI/Hepatic negative GI ROS, Neg liver ROS, GERD-  ,  Endo/Other  Negative Endocrine ROS  Renal/GU negative Renal ROS  Genitourinary negative   Musculoskeletal   Abdominal   Peds  Hematology negative hematology ROS (+)   Anesthesia Other Findings Chip left upper incisor  Reproductive/Obstetrics negative OB ROS                           Anesthesia Physical Anesthesia Plan  ASA: II  Anesthesia Plan: General   Post-op Pain Management:    Induction:   Airway Management Planned:   Additional Equipment:   Intra-op Plan:   Post-operative Plan:   Informed Consent: I have reviewed the patients History and Physical, chart, labs and discussed the procedure including the risks, benefits and alternatives for the proposed anesthesia with the patient or authorized representative who has indicated his/her understanding and acceptance.   Dental Advisory Given  Plan Discussed with: CRNA  Anesthesia Plan Comments:         Anesthesia Quick Evaluation

## 2011-04-15 NOTE — Anesthesia Postprocedure Evaluation (Signed)
Anesthesia Post Note  Patient: Yvonne Fisher  Procedure(s) Performed:  LAPAROSCOPIC CHOLECYSTECTOMY WITH INTRAOPERATIVE CHOLANGIOGRAM - laparoscopic cholecystectomy with cholangiogram  Anesthesia type: General  Patient location: PACU  Post pain: Pain level controlled  Post assessment: Post-op Vital signs reviewed  Last Vitals:  Filed Vitals:   04/15/11 1500  BP: 125/68  Pulse: 72  Temp: 36.4 C  Resp: 20    Post vital signs: Reviewed  Level of consciousness: sedated  Complications: No apparent anesthesia complications

## 2011-04-15 NOTE — Anesthesia Procedure Notes (Signed)
Procedure Name: Intubation Date/Time: 04/15/2011 12:00 PM Performed by: Hulan Fess Pre-anesthesia Checklist: Patient identified, Emergency Drugs available, Suction available, Patient being monitored and Timeout performed Patient Re-evaluated:Patient Re-evaluated prior to inductionOxygen Delivery Method: Circle System Utilized Preoxygenation: Pre-oxygenation with 100% oxygen Intubation Type: IV induction Ventilation: Mask ventilation without difficulty Laryngoscope Size: Mac and 3 Grade View: Grade I Tube size: 8.0 mm

## 2011-04-15 NOTE — Op Note (Signed)
Patient Name:           Yvonne Fisher   Date of Surgery:        04/15/2011  Pre op Diagnosis:      Chronic cholecystitis with cholelithiasis  Post op Diagnosis:    Chronic cholecystitis with cholelithiasis  Procedure:                 Laparoscopic cholecystectomy with cholangiogram  Surgeon:                     Angelia Mould. Derrell Lolling, M.D., FACS  Assistant:                      Manus Rudd, M.D., [facility]  Operative Indications:   This is a 35 year old female who recently developed an episode of epigastric pain, diaphoresis and with radiation of pain to the back. This was severe. She went to the emergency room where she was found to have a white blood cell count of 11,500, slightly elevated liver function tests, normal lipase, and her pregnancy test negative and ultrasound showed a contracted gallbladder with stones. She had one prior episode. She was referred to see me in the office. I felt that she had had episodes of biliary colic and offered cholecystectomy to her. She is brought to the operating room electively.  Operative Findings:       The gallbladder was chronically inflamed. It was discolored, thickwalled, and had extensive adhesions to it. The cholangiogram was basically normal in that it was only slightly dilated, there was no obstruction, and no filling defect. Common bile duct seemed to enter the third portion of the duodenum. The liver, stomach, duodenum, small intestine, and large intestine were normal to inspection.  Procedure in Detail:          Following the induction of general endotracheal anesthesia the patient's abdomen was prepped and draped in a sterile fashion. Intravenous antibiotics were given. Surgical time out was held. 0.5% Marcaine with epinephrine was used as a local infiltration anesthetic. A vertical incision was made at the lower rim of the umbilicus. The fascia was incised in the midline and the abdominal cavity entered under direct vision. An 11 mm Hassan trocar was  inserted and secured with a purse string suture of 0 Vicryl. Pneumoperitoneum was created. The camera was inserted with findings as described above. An 11 mm trocar was placed in the subxiphoid region and two 5 mm trocars placed in the right upper quadrant. The gallbladder fundus was identified and elevated. We took down all the adhesions from the gallbladder until we could make sure the duodenum was carefully dissected away. We could then retract the infundibulum. We then dissected the peritoneum off of the cystic duct and cystic artery. We created a window behind the cystic duct. Cholangiogram catheter was inserted into the cystic duct. A cholangiogram was obtained using the C-arm. The cholangiogram was normal as described above. The cholangiocatheter was removed, the cystic duct was secured with multiple metal clips and divided. The cystic artery was isolated and secured with metal clips and divided. The gallbladder was dissected from its bed with the electrocautery, placed in a specimen bag and removed. The operative field and the subphrenic space were irrigated with saline. The irrigation fluid was completely clear. There was no bleeding or bile  leak. The trocars were removed under direct vision. There was no bleeding from the trocar sites. The pneumoperitoneum was released. The fascia at the  umbilicus was closed with 0 Vicryl sutures. The skin incisions were closed with subcuticular sutures of 4-0 Monocryl and Dermabond. The patient was taken to recovery room in stable condition. Complications none. EBL 10 cc. Counts correct.     Angelia Mould. Derrell Lolling, M.D., FACS General and Minimally Invasive Surgery Breast and Colorectal Surgery  04/15/2011 12:59 PM

## 2011-04-15 NOTE — Interval H&P Note (Signed)
History and Physical Interval Note:  04/15/2011 11:44 AM  Yvonne Fisher  has presented today for surgery, with the diagnosis of gallstones  The goals of treatment, the various methods of treatment have been discussed with the patient and family. After consideration of risks, benefits and other options for treatment, the patient has consented to  Procedure(s): LAPAROSCOPIC CHOLECYSTECTOMY WITH INTRAOPERATIVE CHOLANGIOGRAM as a surgical intervention .  The patients' history has been reviewed today , patient examined, there is no change in status, she is stable for surgery.  I have reviewed the patients' chart and labs.  Questions were answered to the patient's satisfaction.     Ernestene Mention 04/15/2011 11:44 AM

## 2011-04-16 ENCOUNTER — Telehealth (INDEPENDENT_AMBULATORY_CARE_PROVIDER_SITE_OTHER): Payer: Self-pay

## 2011-04-16 MED ORDER — HYDROCODONE-ACETAMINOPHEN 5-325 MG PO TABS: 1.0000 | ORAL_TABLET | ORAL | Status: AC | PRN

## 2011-04-16 NOTE — Progress Notes (Addendum)
Patient received discharged papers with instructions and prescriptions. Patient verbalize understanding. IV removed with no difficulty and catheter intact.

## 2011-04-16 NOTE — Telephone Encounter (Signed)
PT NOTIFIED OF PO APPT AND RTW DATE.

## 2011-04-16 NOTE — Discharge Summary (Signed)
  Patient ID: Yvonne Fisher [id number] 35 y.o. July 24, 1975  04/15/2011  Discharge date and time: No discharge date for patient encounter.  Admitting Physician: Ernestene Mention  Discharge Physician: Ernestene Mention  Admission Diagnoses: gallstones  Discharge Diagnoses: chronic cholecystitis with cholelithiasis  Operations: Procedure(s): LAPAROSCOPIC CHOLECYSTECTOMY WITH INTRAOPERATIVE CHOLANGIOGRAM  Admission Condition: good  Discharged Condition: good  Indication for Admission: elective cholecystectomy  Hospital Course: the patient was admitted electively on December 18 for cholecystectomy. She was taken to the operating room and underwent laparoscopic cholecystectomy with cholangiogram. Her gallbladder was then be chronically inflamed with stones present. The cholangiogram was found to be normal. Postoperatively she did well progressing in her diet and activities without complications. On postop day one her wounds looked good her abdomen was soft. There were no signs of any complications. She was discharged home. Followup with Dr. Derrell Lolling was arranged in 3 weeks. Prescription for Vicodin was given. Return to work in 2 weeks.  Consults: none  Significant Diagnostic Studies: intraoperative cholangiogram  Treatments: surgery: laparoscopic cholecystectomy with cholangiogram  Disposition: Home  Patient Instructions:   [name], [name]  Home Medication Instructions [id number]   Printed on:04/16/11 0357  Medication Information                    HYDROcodone-acetaminophen (NORCO) 5-325 MG per tablet Take 1-2 tablets by mouth every 4 (four) hours as needed.           HYDROcodone-acetaminophen (NORCO) 5-325 MG per tablet Take 1-2 tablets by mouth every 4 (four) hours as needed for pain.             Activity: no heavy lifting for 2 weeks Diet: low fat, low cholesterol diet Wound Care: as directed  Follow-up:  With Dr. Derrell Lolling in 3 week.  Signed: Angelia Mould. Derrell Lolling,  M.D., FACS General and minimally invasive surgery Breast and Colorectal Surgery  04/16/2011, 3:57 AM

## 2011-04-16 NOTE — Progress Notes (Signed)
1 Day Post-Op  Subjective: Stable and alert. Tolerating full liquid diet. No nausea or vomiting. Up to the bathroom. Only complaint is incisional soreness.  Objective: Vital signs in last 24 hours: Temp:  [97 F (36.1 C)-98.1 F (36.7 C)] 98 F (36.7 C) (12/18 2145) Pulse Rate:  [62-94] 94  (12/18 2145) Resp:  [18-22] 18  (12/18 2145) BP: (119-144)/(54-85) 144/83 mmHg (12/18 2145) SpO2:  [98 %-100 %] 100 % (12/18 2145) Weight:  [231 lb (104.781 kg)] 231 lb (104.781 kg) (12/18 1811) Last BM Date: 04/14/11  Intake/Output from previous day: 12/18 0701 - 12/19 0700 In: 1000 [I.V.:1000] Out: 450 [Urine:450] Intake/Output this shift:    General appearance: alert.  Oriented. No acute distress. Stable vital signs. GI: abdomen is soft. Typical incisional tenderness. Wounds looked fine. Not distended. Benign exam for postop day 1.  Lab Results:  No results found for this or any previous visit (from the past 24 hour(s)).   Studies/Results: @RISRSLT24 @     . heparin  5,000 Units Subcutaneous Once  . heparin  5,000 Units Subcutaneous Q8H  . influenza  inactive virus vaccine  0.5 mL Intramuscular Tomorrow-1000  . morphine      . DISCONTD: ceFAZolin (ANCEF) IV  2 g Intravenous 60 min Pre-Op  . DISCONTD: fentaNYL      . DISCONTD: fentaNYL         Assessment/Plan: s/p Procedure(s): LAPAROSCOPIC CHOLECYSTECTOMY WITH INTRAOPERATIVE CHOLANGIOGRAM  Stable and doing well postop. Advance diet activities.  Anticipate discharge today. Prescription for Vicodin given. Return to work 2 weeks. Office visit 3 weeks.    LOS: 1 day    Yvonne Fisher M 04/16/2011  . .prob

## 2011-04-16 NOTE — Plan of Care (Signed)
Problem: Phase I Progression Outcomes Goal: OOB as tolerated unless otherwise ordered Outcome: Completed/Met Date Met:  04/16/11 Patient walked up and down the hall with family and tolerated well.

## 2011-04-18 ENCOUNTER — Encounter (HOSPITAL_COMMUNITY): Payer: Self-pay | Admitting: General Surgery

## 2011-04-28 ENCOUNTER — Encounter (INDEPENDENT_AMBULATORY_CARE_PROVIDER_SITE_OTHER): Payer: Self-pay | Admitting: General Surgery

## 2011-04-28 ENCOUNTER — Ambulatory Visit (INDEPENDENT_AMBULATORY_CARE_PROVIDER_SITE_OTHER): Payer: 59 | Admitting: General Surgery

## 2011-04-28 VITALS — BP 130/82 | HR 85 | Temp 97.6°F | Ht 68.0 in | Wt 226.4 lb

## 2011-04-28 DIAGNOSIS — Z9889 Other specified postprocedural states: Secondary | ICD-10-CM

## 2011-04-28 NOTE — Patient Instructions (Signed)
All of your wounds are healing normally without any sign of complications. You seem to be recovering from your gallbladder surgery without a problem. The soreness in your incisions will slowly go away over the next 4 weeks.  You may resume all normal physical activities without restriction after January 2. Return to see me if there are further problems.

## 2011-04-28 NOTE — Progress Notes (Signed)
Subjective:     Patient ID: Yvonne Fisher, female   DOB: 30-Nov-1975, 35 y.o.   MRN: [id number]  HPI  Status post laparoscopic cholecystectomy with cholangiogram on April 15, 2011. Final pathology report shows chronic cholecystitis with cholelithiasis. The cholangiogram was normal.  The patient returns today stating that she is still sore. She states that she does not want to go back to work. She is able to eat without nausea.. She is having bowel movements. No fever or chills. No wound problems. Review of Systems     Objective:   Physical Exam Patient is alert, oriented, in no distress. Temp 97.6. Heart rate 85.  Abdomen obese. Soft. Nontender. Nondistended. All the incisions are healing normally. No drainage or infection or hernia.    Assessment:     Chronic cholecystitis with cholelithiasis, uneventful recovery following laparoscopic cholecystectomy with cholangiogram    Plan:     Low-fat diet and hydration encouraged.  Patient advised that the soreness will slowly resolve over the next 2-4 weeks.  Okay to resume all normal physical activities without restriction.  Return to see me p.r.n.

## 2011-05-15 ENCOUNTER — Encounter (INDEPENDENT_AMBULATORY_CARE_PROVIDER_SITE_OTHER): Payer: 59 | Admitting: General Surgery

## 2011-05-19 ENCOUNTER — Encounter (INDEPENDENT_AMBULATORY_CARE_PROVIDER_SITE_OTHER): Payer: Self-pay | Admitting: General Surgery

## 2011-05-19 ENCOUNTER — Ambulatory Visit (INDEPENDENT_AMBULATORY_CARE_PROVIDER_SITE_OTHER): Payer: 59 | Admitting: General Surgery

## 2011-05-19 VITALS — BP 132/78 | HR 97 | Temp 98.2°F | Ht 68.5 in | Wt 231.0 lb

## 2011-05-19 DIAGNOSIS — Z9889 Other specified postprocedural states: Secondary | ICD-10-CM

## 2011-05-19 NOTE — Patient Instructions (Signed)
You seem to be recovering normally following your gallbladder surgery. You will be sore for a few more weeks but the pain should eventually go away. I advise low-fat diet. No restrictions on your activities. Return to see me if any new problems arise.

## 2011-05-19 NOTE — Progress Notes (Signed)
Subjective:     Patient ID: Yvonne Fisher, female   DOB: January 31, 1976, 36 y.o.   MRN: [id number]  HPI This 36 year old female underwent laparoscopic cholecystectomy with cholangiogram on April 15, 2011. She is doing well. She still has pain at her umbilicus. She is back to work full-time as she works in [employer], [employer] and has to do heavy lifting. She is adhering to a low-fat diet and is digesting her food fine. She was constipated earlier but her bowels were moving fine. No fever.  Review of Systems     Objective:   Physical Exam Patient looks well. In no distress. Vital signs normal.  Abdomen soft. All the trocar sites have healed normally. No hematoma or infection. Tender at the umbilicus. No hernia.    Assessment:     Chronic cholecystitis with cholelithiasis. Uneventful recovery following laparoscopic cholecystectomy.  Incisional pain, suspect this will be self-limited.    Plan:     Advise low-fat diet.  No restriction of activities.  Return to see me p.r.n.   Angelia Mould. Derrell Lolling, M.D., [facility] General and Minimally invasive Surgery Breast and Colorectal Surgery Office:   [phone] Pager:   [phone]

## 2011-09-25 ENCOUNTER — Ambulatory Visit (INDEPENDENT_AMBULATORY_CARE_PROVIDER_SITE_OTHER): Payer: 59 | Admitting: Family Medicine

## 2011-09-25 VITALS — BP 130/84 | HR 91 | Temp 98.7°F | Resp 18 | Ht 68.0 in | Wt 230.0 lb

## 2011-09-25 DIAGNOSIS — L02419 Cutaneous abscess of limb, unspecified: Secondary | ICD-10-CM

## 2011-09-25 DIAGNOSIS — R739 Hyperglycemia, unspecified: Secondary | ICD-10-CM

## 2011-09-25 DIAGNOSIS — IMO0002 Reserved for concepts with insufficient information to code with codable children: Secondary | ICD-10-CM

## 2011-09-25 DIAGNOSIS — R7309 Other abnormal glucose: Secondary | ICD-10-CM

## 2011-09-25 DIAGNOSIS — R7303 Prediabetes: Secondary | ICD-10-CM

## 2011-09-25 LAB — POCT CBC
HCT, POC: 39.8 % (ref 37.7–47.9)
Lymph, poc: 2.8 (ref 0.6–3.4)
MCH, POC: 29.1 pg (ref 27–31.2)
MCHC: 33.4 g/dL (ref 31.8–35.4)
MCV: 87 fL (ref 80–97)
POC LYMPH PERCENT: 28.7 %L (ref 10–50)
RDW, POC: 13.8 %

## 2011-09-25 MED ORDER — DOXYCYCLINE HYCLATE 100 MG PO TABS: 100.0000 mg | ORAL_TABLET | Freq: Two times a day (BID) | ORAL | Status: AC

## 2011-09-25 NOTE — Patient Instructions (Addendum)
Return Saturday to followup Take doxycycline twice daily Ibuprofen for pain 600 mg 4 times daily   You have pre-diabetes (hyperglycemia)  Work hard on weight loss.  Recheck this in 4 months.  American Diabetic Association web site for dietary guidance.

## 2011-09-25 NOTE — Progress Notes (Signed)
Subjective: 36 year old lady who has had a boil coming up under her left armpit for 8 days. Gradually getting worse. It has not drained any. She has had similar in the past, the last one being 6 years ago. She did have her gallbladder out about 6 months ago. Her sugar was a little bit on the borderline high side at that time that she's never been diabetic.  Objective: Overweight Afro-American female in no acute distress this time. She has a large cystic abscess under the left axilla which measures approximately 7 or 8 cm in the long axis, less vertically. It is . There is no drainage. very fluctuant in the center. Left axilla looks okay.  Assessment: Axillary abscess, moderately large History of mild hyperglycemia  Plan: I&D CBC, glucose, hemoglobin A1c Results for orders placed in visit on 09/25/11  POCT CBC      Component Value Range   WBC 9.7  4.6 - 10.2 (K/uL)   Lymph, poc 2.8  0.6 - 3.4    POC LYMPH PERCENT 28.7  10 - 50 (%L)   MID (cbc) 0.7  0 - 0.9    POC MID % 7.5  0 - 12 (%M)   POC Granulocyte 6.2  2 - 6.9    Granulocyte percent 63.8  37 - 80 (%G)   RBC 4.57  4.04 - 5.48 (M/uL)   Hemoglobin 13.3  12.2 - 16.2 (g/dL)   HCT, POC 95.6  21.3 - 47.9 (%)   MCV 87.0  80 - 97 (fL)   MCH, POC 29.1  27 - 31.2 (pg)   MCHC 33.4  31.8 - 35.4 (g/dL)   RDW, POC 08.6     Platelet Count, POC 360  142 - 424 (K/uL)   MPV 8.8  0 - 99.8 (fL)  GLUCOSE, POCT (MANUAL RESULT ENTRY)      Component Value Range   POC Glucose 118 (*) 70 - 99 (mg/dl)  POCT GLYCOSYLATED HEMOGLOBIN (HGB A1C)      Component Value Range   Hemoglobin A1C 6.3

## 2011-09-25 NOTE — Progress Notes (Signed)
   Patient ID: CORTNE AMARA MRN: [id number], DOB: Sep 19, 1975, 36 y.o. Date of Encounter: 09/25/2011, 1:28 PM    PROCEDURE NOTE: Verbal consent obtained. Betadine prep per usual protocol. Local anesthesia obtained with 2% plain lidocaine 5cc  1cm incision made with 11 blade along lesion.  Culture taken. Copious purulence expressed. Lesion explored revealing no loculations. Irrigated with normal saline Packed with 1/4 inch plain packing Dressed. Wound care instructions including precautions with patient. Patient tolerated the procedure well. Recheck in 48 hours     Signed, Eula Listen, PA-C 09/25/2011 1:28 PM

## 2011-09-27 ENCOUNTER — Ambulatory Visit (INDEPENDENT_AMBULATORY_CARE_PROVIDER_SITE_OTHER): Payer: 59 | Admitting: Physician Assistant

## 2011-09-27 VITALS — BP 120/77 | HR 87 | Temp 98.5°F | Resp 16 | Ht 69.25 in | Wt 229.0 lb

## 2011-09-27 DIAGNOSIS — L02419 Cutaneous abscess of limb, unspecified: Secondary | ICD-10-CM

## 2011-09-27 DIAGNOSIS — IMO0002 Reserved for concepts with insufficient information to code with codable children: Secondary | ICD-10-CM

## 2011-09-27 NOTE — Progress Notes (Signed)
   Patient ID: Yvonne Fisher MRN: [id number], DOB: [birth year] 36 y.o. Date of Encounter: 09/27/2011, 2:27 PM  Primary Physician: Provider Not In System  Chief Complaint: Wound care   See previous note  HPI: 36 y.o. y/o female presents for wound care s/p I&D on 09/25/11 Doing well No issues or complaints Afebrile/ no chills No nausea or vomiting Tolerating Doxycycline Pain improving Daily dressing change Previous note reviewed  Past Medical History  Diagnosis Date  . Acid reflux   . Arthritis     right elbow      Home Meds: Prior to Admission medications   Medication Sig Start Date End Date Taking? Authorizing Provider  doxycycline (VIBRA-TABS) 100 MG tablet Take 1 tablet (100 mg total) by mouth 2 (two) times daily. 09/25/11 10/05/11 Yes Peyton Najjar, MD    Allergies:  Allergies  Allergen Reactions  . Lansoprazole     Hives      ROS: Constitutional: Afebrile, no chills Cardiovascular: negative for chest pain or palpitations Dermatological: Positive for wound. Negative for erythema, or warmth  GI: No nausea or vomiting   EXAM: Physical Exam: Blood pressure 120/77, pulse 87, temperature 98.5 F (36.9 C), temperature source Oral, resp. rate 16, height 5' 9.25" (1.759 m), weight 229 lb (103.874 kg), last menstrual period 09/09/2011, SpO2 100.00%., Body mass index is 33.57 kg/(m^2). General: Well developed, well nourished, in no acute distress. Nontoxic appearing. Head: Normocephalic, atraumatic, sclera non-icteric.  Neck: Supple. Lungs: Breathing is unlabored. Heart: Normal rate. Skin:  Warm and moist. Dressing and packing in place. Mild local induration and TTP without erythema. Neuro: Alert and oriented X 3. Moves all extremities spontaneously. Normal gait.  Psych:  Responds to questions appropriately with a normal affect.   PROCEDURE: Dressing and packing removed. Scant amount of purulence expressed Wound bed healthy Irrigated with 1% plain lidocaine 5  cc. Repacked with 1/4 inch plain packing Dressing applied  LAB: Culture: No growth one day  A/P: 36 y.o. y/o female with cellulitis/abscess as above s/p I&D on 09/25/11 -Wound care per above -Continue Doxycycline -Pain well controlled -Daily dressing changes -Recheck 48 hours  Signed, Eula Listen, PA-C 09/27/2011 2:27 PM

## 2011-09-28 ENCOUNTER — Encounter: Payer: Self-pay | Admitting: Physician Assistant

## 2011-09-28 ENCOUNTER — Ambulatory Visit (INDEPENDENT_AMBULATORY_CARE_PROVIDER_SITE_OTHER): Payer: 59 | Admitting: Physician Assistant

## 2011-09-28 VITALS — BP 129/77 | HR 90 | Temp 98.7°F | Resp 16 | Ht 69.5 in | Wt 231.0 lb

## 2011-09-28 DIAGNOSIS — L02419 Cutaneous abscess of limb, unspecified: Secondary | ICD-10-CM

## 2011-09-28 DIAGNOSIS — IMO0002 Reserved for concepts with insufficient information to code with codable children: Secondary | ICD-10-CM

## 2011-09-28 LAB — WOUND CULTURE
Gram Stain: NONE SEEN
Organism ID, Bacteria: NO GROWTH

## 2011-09-28 NOTE — Progress Notes (Signed)
   Patient ID: Yvonne Fisher MRN: [id number], DOB: November 22, 1975 36 y.o. Date of Encounter: 09/28/2011, 4:55 PM  Primary Physician: Provider Not In System  Chief Complaint: Wound care   See previous note  HPI: 36 y.o. y/o female presents for wound care s/p I&D on 09/25/11 Doing well No issues or complaints Afebrile/ no chills No nausea or vomiting Packing fell out this afternoon during her daily dressing change, prompting her to come in today for repacking Tolerating Doxycycline Pain resolving Daily dressing change Previous note reviewed  Past Medical History  Diagnosis Date  . Acid reflux   . Arthritis     right elbow      Home Meds: Prior to Admission medications   Medication Sig Start Date End Date Taking? Authorizing Provider  doxycycline (VIBRA-TABS) 100 MG tablet Take 1 tablet (100 mg total) by mouth 2 (two) times daily. 09/25/11 10/05/11  Peyton Najjar, MD    Allergies:  Allergies  Allergen Reactions  . Lansoprazole     Hives      ROS: Constitutional: Afebrile, no chills Cardiovascular: negative for chest pain or palpitations Dermatological: Positive for wound. Negative for erythema, pain, or warmth  GI: No nausea or vomiting   EXAM: Physical Exam: Blood pressure 129/77, pulse 90, temperature 98.7 F (37.1 C), resp. rate 16, height 5' 9.5" (1.765 m), weight 231 lb (104.781 kg), last menstrual period 09/09/2011., Body mass index is 33.62 kg/(m^2). General: Well developed, well nourished, in no acute distress. Nontoxic appearing. Head: Normocephalic, atraumatic, sclera non-icteric.  Neck: Supple. Lungs: Breathing is unlabored. Heart: Normal rate. Skin:  Warm and moist. Dressing and packing in place. Mild improving local induration, without erythema, or tenderness to palpation. Neuro: Alert and oriented X 3. Moves all extremities spontaneously. Normal gait.  Psych:  Responds to questions appropriately with a normal affect.   PROCEDURE: No dressing or  packing in place No purulence expressed Wound bed healthy Irrigated with 1% plain lidocaine 5 cc. Repacked with 1/4 inch plain packing Dressing applied  LAB: Culture: No growth 2 days  A/P: 36 y.o. y/o female with improving cellulitis/abscess as above s/p I&D on 09/25/11 -Wound care per above -Continue Doxycycline -Pain well controlled -Daily dressing changes -Recheck 48 hours  Signed, Eula Listen, PA-C 09/28/2011 4:55 PM

## 2011-09-29 ENCOUNTER — Ambulatory Visit (INDEPENDENT_AMBULATORY_CARE_PROVIDER_SITE_OTHER): Payer: 59 | Admitting: Physician Assistant

## 2011-09-29 VITALS — BP 129/76 | HR 70 | Temp 97.8°F | Resp 16 | Ht 69.0 in | Wt 232.2 lb

## 2011-09-29 DIAGNOSIS — L0390 Cellulitis, unspecified: Secondary | ICD-10-CM

## 2011-09-29 DIAGNOSIS — L0291 Cutaneous abscess, unspecified: Secondary | ICD-10-CM

## 2011-09-29 NOTE — Progress Notes (Signed)
  Subjective:    Patient ID: Yvonne Fisher, female    DOB: June 10, 1975, 36 y.o.   MRN: [id number]  HPI  Astha comes in today because the packing fell out of left axillary incision.  She attempted to change bandage because of discomfort and packing pulled out.  She is doing well overall and tolerating the medication. No fever or chills.  No worsening symptoms  Review of Systems    As above Objective:   Physical Exam Left axilla with partially open incision.  No packing noted.  Purulent drainage when light pressure applied. Large area of induration and some tenderness. Irrigated with 3 cc Lidocaine 2%.  Repacked with 1/4" plain.  Dressed.       Assessment & Plan:  Abscess, left axilla  Continue abx. Recheck 48 hours.  Encouraged hot compresses.

## 2011-10-01 ENCOUNTER — Ambulatory Visit (INDEPENDENT_AMBULATORY_CARE_PROVIDER_SITE_OTHER): Payer: 59

## 2011-10-01 VITALS — BP 121/73 | HR 78 | Temp 98.2°F | Resp 16 | Ht 69.0 in | Wt 232.0 lb

## 2011-10-01 DIAGNOSIS — L0291 Cutaneous abscess, unspecified: Secondary | ICD-10-CM

## 2011-10-01 DIAGNOSIS — IMO0002 Reserved for concepts with insufficient information to code with codable children: Secondary | ICD-10-CM

## 2011-10-02 NOTE — Progress Notes (Signed)
  Subjective:    Patient ID: Yvonne Fisher, female    DOB: 08-30-1975, 36 y.o.   MRN: [id number]  HPI  Wound healing  Review of Systems     Objective:   Physical Exam  Doing well      Assessment & Plan:  Wound care

## 2011-10-03 ENCOUNTER — Ambulatory Visit (INDEPENDENT_AMBULATORY_CARE_PROVIDER_SITE_OTHER): Payer: 59 | Admitting: Physician Assistant

## 2011-10-03 VITALS — BP 136/81 | HR 97 | Temp 97.9°F | Resp 16 | Ht 69.0 in | Wt 232.0 lb

## 2011-10-03 DIAGNOSIS — L02412 Cutaneous abscess of left axilla: Secondary | ICD-10-CM

## 2011-10-03 DIAGNOSIS — IMO0002 Reserved for concepts with insufficient information to code with codable children: Secondary | ICD-10-CM

## 2011-10-03 NOTE — Progress Notes (Signed)
  Subjective:    Patient ID: [name], female    DOB: Aug 07, 1975, 36 y.o.   MRN: [id number]  HPI Ms. Laflamme comes in today for dressing change s/p I&D left axilla.  She states that the packing has fallen out. She still has pain but feels it is improving.  She is tolerating the medication. She has no fever and chills.  She is itching where the bandage is.    Review of Systems As above    Objective:   Physical Exam  Left axilla  No packing seen.  Incision is patent and purulent drainage expressed.  Induration is smaller but still significant.  Injected area with Lidocaine 2% 4 cc.  Used curved hemostat to probe abscess.  Packing reinserted and dressed.       Assessment & Plan:  Abscess, axilla  Return 48 hours for dressing change.  Wound care for home reviewed.  Continue meds.

## 2011-10-05 ENCOUNTER — Ambulatory Visit (INDEPENDENT_AMBULATORY_CARE_PROVIDER_SITE_OTHER): Payer: 59 | Admitting: Physician Assistant

## 2011-10-05 ENCOUNTER — Encounter: Payer: Self-pay | Admitting: Physician Assistant

## 2011-10-05 VITALS — BP 111/75 | HR 82 | Temp 98.3°F | Resp 18 | Wt 231.0 lb

## 2011-10-05 DIAGNOSIS — IMO0002 Reserved for concepts with insufficient information to code with codable children: Secondary | ICD-10-CM

## 2011-10-05 DIAGNOSIS — L02419 Cutaneous abscess of limb, unspecified: Secondary | ICD-10-CM

## 2011-10-05 NOTE — Progress Notes (Signed)
  Subjective:    Patient ID: Yvonne Fisher, female    DOB: 06-18-1975, 36 y.o.   MRN: [id number]  HPI Yvonne Fisher is here for dressing change/wound check s/p I & D 09/25/11.  She feels a little better than last visit but reports the area around the incision is very itchy.  No fever or chills, redness.   Review of Systems As noted in HPI    Objective:   Physical Exam Left axilla   Yellowish green drainage noted on packing.  Decreased induration noted.  No erythema.  Wound irrigated with 3 cc Lidocaine 2%.  Area noted to have some localized redness from adhesive.  Cleansed with soap and water.  Repacked incision with 1/4" plain.  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  Reviewed home wound care and offered for her to come in tomorrow for dressing change. Apply heat.  OTC HC and Claritin for itching,

## 2011-10-06 ENCOUNTER — Ambulatory Visit (INDEPENDENT_AMBULATORY_CARE_PROVIDER_SITE_OTHER): Payer: 59 | Admitting: Physician Assistant

## 2011-10-06 VITALS — BP 128/84 | HR 79 | Temp 98.4°F | Resp 18 | Ht 69.0 in | Wt 234.0 lb

## 2011-10-06 DIAGNOSIS — L0291 Cutaneous abscess, unspecified: Secondary | ICD-10-CM

## 2011-10-06 DIAGNOSIS — L0390 Cellulitis, unspecified: Secondary | ICD-10-CM

## 2011-10-06 NOTE — Progress Notes (Signed)
  Subjective:    Patient ID: Yvonne Fisher, female    DOB: [birth year], 36 y.o.   MRN: [id number]  HPI Ms. Zavadil is here for wound recheck and dressing recheck.  I saw her yesterday and decided to have her return today to reassess the bandage as she has been having trouble with adhesive and having the bandage stay put when she is at work.  Her pain is minimal and notes that abscess is still draining.  She has not had fever or chills.   Review of Systems As noted in HPI    Objective:   Physical Exam Left axilla   Hypafix removed.  It appears that the adhesive reaction has decreased from yesterday but she says that it is still itchy.  Packing is intact and remains anchored with tape.  The abscess is draining.  Packing left in place and skin cleansed with soap and water as well as using adhesive remover pads.  A 2x3 in bandaid applied to decrease adhesive and to stand up to the amount of movement she does during the day.        Assessment & Plan:  Abscess, axilla  Recheck 48 hours.  Encouraged her to change band aid and apply heat as she has been doing.

## 2011-10-08 ENCOUNTER — Ambulatory Visit (INDEPENDENT_AMBULATORY_CARE_PROVIDER_SITE_OTHER): Payer: 59 | Admitting: Physician Assistant

## 2011-10-08 VITALS — BP 121/78 | HR 96 | Temp 98.0°F | Resp 16 | Ht 68.0 in | Wt 232.4 lb

## 2011-10-08 DIAGNOSIS — IMO0002 Reserved for concepts with insufficient information to code with codable children: Secondary | ICD-10-CM

## 2011-10-08 NOTE — Progress Notes (Signed)
S: Presents for wound care, s/p I&D of left axillary abscess on 09/25/2011.  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  The site is still very sore, still draining.  O: VS noted.  WDWNBF, A&Ox3. Dressing is wet, but remains in place.  The packing is removed, saturated.  Purulence expressed with gentle pressure.  Very tender.  Irrigated wound with 3 cc of 1% lidocaine plain.  Packed with 1/4 inch plain packing.  Dressing applied.  A: Cellulitis/abscess, left axilla, s/p I&D 09/25/2011  P: Change the dressing as needed-she has bandages at home.  RTC 10/10/2011 to see Mr. Jetta Lout (she will come early, before an event at her son's school and then leaving town for the weekend), but if the packing comes out, she'll return tomorrow.  Mr. Shea Evans will instruct her on wound care while she's out of town, and she will RTC on Monday 10/13/2011.

## 2011-10-10 ENCOUNTER — Ambulatory Visit (INDEPENDENT_AMBULATORY_CARE_PROVIDER_SITE_OTHER): Payer: 59 | Admitting: Physician Assistant

## 2011-10-10 VITALS — BP 137/81 | HR 68 | Temp 97.7°F | Resp 16 | Ht 68.0 in | Wt 232.0 lb

## 2011-10-10 DIAGNOSIS — L02419 Cutaneous abscess of limb, unspecified: Secondary | ICD-10-CM

## 2011-10-10 DIAGNOSIS — IMO0002 Reserved for concepts with insufficient information to code with codable children: Secondary | ICD-10-CM

## 2011-10-10 NOTE — Progress Notes (Signed)
   Patient ID: Yvonne Fisher MRN: [id number], DOB: [birth year] 36 y.o. Date of Encounter: 10/10/2011, 8:12 AM  Primary Physician: Provider Not In System  Chief Complaint: Wound care   See previous note  HPI: 36 y.o. y/o female presents for wound care s/p I&D on 09/25/11. Doing well No issues or complaints Afebrile/ no chills No nausea or vomiting Finished Doxycycline Pain improving Daily dressing change Previous notes reviewed Traveling to [place], [place] today and staying there until Sunday 10/12/11  Past Medical History  Diagnosis Date  . Acid reflux   . Arthritis     right elbow      Home Meds: Prior to Admission medications   Not on File    Allergies:  Allergies  Allergen Reactions  . Lansoprazole     Hives      ROS: Constitutional: Afebrile, no chills Cardiovascular: negative for chest pain or palpitations Dermatological: Positive for wound. Negative for erythema, or warmth  GI: No nausea or vomiting   EXAM: Physical Exam: Blood pressure 137/81, pulse 68, temperature 97.7 F (36.5 C), temperature source Oral, resp. rate 16, height 5\' 8"  (1.727 m), weight 232 lb (105.235 kg), last menstrual period 10/06/2011., Body mass index is 35.28 kg/(m^2). General: Well developed, well nourished, in no acute distress. Nontoxic appearing. Head: Normocephalic, atraumatic, sclera non-icteric.  Neck: Supple. Lungs: Breathing is unlabored. Heart: Normal rate. Skin:  Warm and moist. Dressing and packing in place. Mild induration and TTP without erythema. Neuro: Alert and oriented X 3. Moves all extremities spontaneously. Normal gait.  Psych:  Responds to questions appropriately with a normal affect.   PROCEDURE: Dressing and packing removed. Packing saturated with drainage. Small amount of purulence expressed Wound bed healthy Irrigated with 1% plain lidocaine 5 cc. Repacked with 1/4 inch plain packing. Dressing applied  LAB: Culture: No growth  A/P: 36 y.o.  y/o female with cellulitis/abscess as above s/p I&D on 09/25/11. -Wound care per above -Pull 1/2 inch to 1 inch of packing out on 10/12/11 and follow up 24 hours later -Pain improving -Daily dressing changes -Precautions given to seek care while away  Signed, Eula Listen, PA-C 10/10/2011 8:12 AM

## 2011-10-13 ENCOUNTER — Ambulatory Visit (INDEPENDENT_AMBULATORY_CARE_PROVIDER_SITE_OTHER): Payer: 59 | Admitting: Physician Assistant

## 2011-10-13 ENCOUNTER — Encounter: Payer: Self-pay | Admitting: Physician Assistant

## 2011-10-13 VITALS — BP 116/72 | HR 66 | Temp 97.5°F | Resp 16 | Ht 69.0 in | Wt 232.0 lb

## 2011-10-13 DIAGNOSIS — IMO0002 Reserved for concepts with insufficient information to code with codable children: Secondary | ICD-10-CM

## 2011-10-13 DIAGNOSIS — M79609 Pain in unspecified limb: Secondary | ICD-10-CM

## 2011-10-13 DIAGNOSIS — L03119 Cellulitis of unspecified part of limb: Secondary | ICD-10-CM

## 2011-10-13 MED ORDER — SULFAMETHOXAZOLE-TRIMETHOPRIM 800-160 MG PO TABS: 1.0000 | ORAL_TABLET | Freq: Two times a day (BID) | ORAL | Status: AC

## 2011-10-13 NOTE — Progress Notes (Signed)
   Patient ID: Yvonne Fisher MRN: [id number], DOB: 17-Jun-1975 36 y.o. Date of Encounter: 10/13/2011, 1:46 PM  Primary Physician: Provider Not In System  Chief Complaint: Wound care   See previous note  HPI: 36 y.o. y/o female presents for wound care s/p I&D on 09/25/11 Doing well. Packing has remained in place over the weekend and wound has continued to drain.  Concerned about redness in axilla and worried it is getting infected again.  Afebrile/ no chills No nausea or vomiting Daily dressing change Previous note reviewed  Past Medical History  Diagnosis Date  . Acid reflux   . Arthritis     right elbow      Home Meds: Prior to Admission medications   Medication Sig Start Date End Date Taking? Authorizing Provider  sulfamethoxazole-trimethoprim (BACTRIM DS,SEPTRA DS) 800-160 MG per tablet Take 1 tablet by mouth 2 (two) times daily. 10/13/11 10/23/11  Nelva Nay, PA-C    Allergies:  Allergies  Allergen Reactions  . Lansoprazole     Hives      ROS: Constitutional: Afebrile, no chills Cardiovascular: negative for chest pain or palpitations Dermatological: Positive for wound. Negative for erythema, pain, or warmth. There is some induration around the wound as well as erythema where bandage has been.  GI: No nausea or vomiting   EXAM: Physical Exam: Blood pressure 116/72, pulse 66, temperature 97.5 F (36.4 C), temperature source Oral, resp. rate 16, height 5\' 9"  (1.753 m), weight 232 lb (105.235 kg), last menstrual period 10/06/2011., Body mass index is 34.26 kg/(m^2). General: Well developed, well nourished, in no acute distress. Nontoxic appearing. Head: Normocephalic, atraumatic, sclera non-icteric.  Neck: Supple. Lungs: Breathing is unlabored. Heart: Normal rate. Skin:  Warm and moist. Dressing and packing in place. Mild induration. No erythema or tenderness to palpation. Neuro: Alert and oriented X 3. Moves all extremities spontaneously. Normal gait.    Psych:  Responds to questions appropriately with a normal affect.   PROCEDURE: Dressing and packing removed. Minimal purulence expressed Wound bed healthy Incision narrow but intact.  Irrigated with 1% plain lidocaine 3 cc. Repacked with 1/4 plain packing Dressing applied  LAB: Culture:   A/P: 36 y.o. y/o female with axilla cellulitis/abscess as above s/p I&D on 09/25/11 -Wound care per above -Start Bactrim DS twice daily x 10 days -Pain well controlled -Daily dressing changes -Recheck 48 hours  Signed, Rhoderick Moody, PA-C 10/13/2011 1:46 PM

## 2011-10-15 ENCOUNTER — Encounter: Payer: Self-pay | Admitting: Physician Assistant

## 2011-10-15 ENCOUNTER — Ambulatory Visit (INDEPENDENT_AMBULATORY_CARE_PROVIDER_SITE_OTHER): Payer: 59 | Admitting: Physician Assistant

## 2011-10-15 VITALS — BP 128/83 | HR 93 | Temp 97.6°F | Resp 18 | Ht 68.0 in | Wt 234.0 lb

## 2011-10-15 DIAGNOSIS — L0291 Cutaneous abscess, unspecified: Secondary | ICD-10-CM

## 2011-10-15 DIAGNOSIS — L0390 Cellulitis, unspecified: Secondary | ICD-10-CM

## 2011-10-15 DIAGNOSIS — L03119 Cellulitis of unspecified part of limb: Secondary | ICD-10-CM

## 2011-10-15 DIAGNOSIS — R21 Rash and other nonspecific skin eruption: Secondary | ICD-10-CM

## 2011-10-15 DIAGNOSIS — L02419 Cutaneous abscess of limb, unspecified: Secondary | ICD-10-CM

## 2011-10-15 MED ORDER — MUPIROCIN CALCIUM 2 % EX CREA: TOPICAL_CREAM | Freq: Three times a day (TID) | CUTANEOUS | Status: AC

## 2011-10-15 MED ORDER — LIDOCAINE HCL 2 % EX GEL: CUTANEOUS | Status: DC | PRN

## 2011-10-15 NOTE — Progress Notes (Signed)
   Patient ID: Yvonne Fisher MRN: [id number], DOB: 20-Jun-1975 36 y.o. Date of Encounter: 10/15/2011, 4:35 PM  Primary Physician: Provider Not In System  Chief Complaint: Wound care   See previous note  HPI: 36 y.o. y/o female presents for wound care s/p I&D on 09/25/11 Doing well No issues or complaints Afebrile/ no chills No nausea or vomiting Tolerating Bactrim  Pain resolved She does mention mild erythema and irritation along the left axilla. There not not pain associated with this erythema, just irritated. 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 Daily dressing change Previous note reviewed  Past Medical History  Diagnosis Date  . Acid reflux   . Arthritis     right elbow      Home Meds: Prior to Admission medications   Medication Sig Start Date End Date Taking? Authorizing Provider  sulfamethoxazole-trimethoprim (BACTRIM DS,SEPTRA DS) 800-160 MG per tablet Take 1 tablet by mouth 2 (two) times daily. 10/13/11 10/23/11 Yes Heather Jaquita Rector, PA-C    Allergies:  Allergies  Allergen Reactions  . Lansoprazole     Hives      ROS: Constitutional: Afebrile, no chills Cardiovascular: negative for chest pain or palpitations Dermatological: Positive for wound. Negative for pain, or warmth  GI: No nausea or vomiting   EXAM: Physical Exam: Blood pressure 128/83, pulse 93, temperature 97.6 F (36.4 C), temperature source Oral, resp. rate 18, height 5\' 8"  (1.727 m), weight 234 lb (106.142 kg), last menstrual period 10/06/2011., Body mass index is 35.58 kg/(m^2). General: Well developed, well nourished, in no acute distress. Nontoxic appearing. Head: Normocephalic, atraumatic, sclera non-icteric.  Neck: Supple. Lungs: Breathing is unlabored. Heart: Normal rate. Skin:  Warm and moist. Dressing and packing in place. No induration or tenderness to palpation. Mild erythema along the border of the left axilla without any  induration or TTP.  Neuro: Alert and oriented X 3. Moves all extremities spontaneously. Normal gait.  Psych:  Responds to questions appropriately with a normal affect.   PROCEDURE: Dressing and packing removed. Scant purulence expressed Wound bed healthy Irrigated with 1% plain lidocaine 5 cc. Repacked with small amount of 1/4 inch plain packing Dressing applied  LAB: Culture: no growth  A/P: 36 y.o. y/o female with cellulitis/abscess as above s/p I&D on 09/25/11 and mild skin irritation of the left axilla -Wound care per above -Continue Bactrim -Add Bactroban 2% cream Apply topically tid #15 gram RF 1 -Add Lidocaine jelly 2% Apply topically as needed #30 mL RF 1 -Pain well controlled -Daily dressing changes -Recheck 48 hours  Signed, Eula Listen, PA-C 10/15/2011 4:35 PM

## 2011-10-17 ENCOUNTER — Ambulatory Visit (INDEPENDENT_AMBULATORY_CARE_PROVIDER_SITE_OTHER): Payer: 59 | Admitting: Physician Assistant

## 2011-10-17 VITALS — BP 122/76 | HR 80 | Temp 98.2°F | Resp 18 | Ht 68.0 in | Wt 235.0 lb

## 2011-10-17 DIAGNOSIS — IMO0002 Reserved for concepts with insufficient information to code with codable children: Secondary | ICD-10-CM

## 2011-10-17 DIAGNOSIS — L02419 Cutaneous abscess of limb, unspecified: Secondary | ICD-10-CM

## 2011-10-17 NOTE — Progress Notes (Signed)
  Subjective:    Patient ID: Yvonne Fisher, female    DOB: 23-Dec-1975, 36 y.o.   MRN: [id number]  HPI Yvonne Fisher comes in today for another wound check and dressing change.  The bandage remaining in place and adhesive reactions remain challenging.  She has little pain and is taking the new abx, spetra. No fever or chills.  Review of Systems As noted in HPI    Objective:   Physical Exam Left axillae with packing intact and removed.  Yellowish discharge on packing but not expressed.  Surrounding erythema form adhesive.  Wound irrigated with Lidocaine.  Repacked.  Dressed.       Assessment & Plan:  Abscess, right axillae  Wound care.  Continue antibiotic and RTC 48 hours

## 2011-10-19 ENCOUNTER — Ambulatory Visit (INDEPENDENT_AMBULATORY_CARE_PROVIDER_SITE_OTHER): Payer: 59 | Admitting: Physician Assistant

## 2011-10-19 VITALS — BP 115/76 | HR 76 | Temp 98.4°F | Resp 16 | Ht 69.0 in | Wt 231.0 lb

## 2011-10-19 DIAGNOSIS — L02419 Cutaneous abscess of limb, unspecified: Secondary | ICD-10-CM

## 2011-10-19 DIAGNOSIS — IMO0002 Reserved for concepts with insufficient information to code with codable children: Secondary | ICD-10-CM

## 2011-10-19 NOTE — Progress Notes (Signed)
  Subjective:    Patient ID: Yvonne Fisher, female    DOB: [birth year], 36 y.o.   MRN: [id number]  HPI  Yvonne Fisher returns today for wound recheck.  The initial visit for this abscess, left axilla was 09/25/11.  The bandage and adhesive issues remain a problem.  She uses her upper extremities so much at work and has hyperhidrosis that proper wound care has been difficult. She states that the wound is still draining a little.   Review of Systems As noted in HPI, otherwise negative     Objective:   Physical Exam  Left axilla with irritated skin surrounding incision.  Small opening remains.  Packing remains and removed to show yellow/green purulent drainage.  Small amount of drainage expressed.  Minimal induration. Area cleansed and small piece of packing reinserted as nothing larger would progress into incision. Dressed with small bandage.      Assessment & Plan:  Abscess, axilla  Wound care reviewed. If packing falls out, pt to continue to try to express any fluid from incision.  Call tomorrow. Decide if needs further follow up as packing may not stay in place.

## 2011-10-21 ENCOUNTER — Ambulatory Visit (INDEPENDENT_AMBULATORY_CARE_PROVIDER_SITE_OTHER): Payer: 59 | Admitting: Physician Assistant

## 2011-10-21 VITALS — BP 121/75 | HR 91 | Temp 98.6°F | Resp 16 | Ht 68.0 in | Wt 234.0 lb

## 2011-10-21 DIAGNOSIS — L02419 Cutaneous abscess of limb, unspecified: Secondary | ICD-10-CM

## 2011-10-21 DIAGNOSIS — L0291 Cutaneous abscess, unspecified: Secondary | ICD-10-CM

## 2011-10-21 DIAGNOSIS — L0390 Cellulitis, unspecified: Secondary | ICD-10-CM

## 2011-10-22 ENCOUNTER — Encounter: Payer: Self-pay | Admitting: Physician Assistant

## 2011-10-22 NOTE — Progress Notes (Signed)
  Subjective:    Patient ID: Yvonne Fisher, female    DOB: 1975-07-20, 36 y.o.   MRN: [id number]  HPI [name] is here for a recheck of her abscess left axilla.  Initial treatment 09/25/11.  She states that the packing fell out and the wound has healed over.  She notes the itching surrounding the incision. She has a small amount of pain.    Review of Systems As noted in HPI, otherwise negative     Objective:   Physical Exam  Left axilla with small opening remaining in original incision with no drainage expressed with firm pressure.Tenderness is mild. Surrounding erythema has diminished.      Assessment & Plan:  Abscess left axilla  Patient to finish septra and encourage daily manipulation of left axilla.  Pt would like a referral to surgery as this abscess was the most involved of all the ones she has experienced.  She states that she has had more than 5 episodes. Referral to be made. Return if any new swelling, redness or pain or prn

## 2011-10-23 ENCOUNTER — Other Ambulatory Visit: Payer: Self-pay | Admitting: Physician Assistant

## 2011-10-23 DIAGNOSIS — L732 Hidradenitis suppurativa: Secondary | ICD-10-CM

## 2011-11-20 ENCOUNTER — Ambulatory Visit (INDEPENDENT_AMBULATORY_CARE_PROVIDER_SITE_OTHER): Payer: 59 | Admitting: Surgery

## 2011-11-21 ENCOUNTER — Ambulatory Visit (INDEPENDENT_AMBULATORY_CARE_PROVIDER_SITE_OTHER): Payer: 59 | Admitting: Surgery

## 2011-11-21 ENCOUNTER — Encounter (INDEPENDENT_AMBULATORY_CARE_PROVIDER_SITE_OTHER): Payer: Self-pay | Admitting: Surgery

## 2011-11-21 VITALS — BP 122/70 | HR 76 | Temp 97.8°F | Resp 14 | Ht 69.0 in | Wt 232.8 lb

## 2011-11-21 DIAGNOSIS — L732 Hidradenitis suppurativa: Secondary | ICD-10-CM | POA: Insufficient documentation

## 2011-11-21 NOTE — Progress Notes (Signed)
Patient ID: Yvonne Fisher, female   DOB: 1976/03/20, 36 y.o.   MRN: [id number]  Chief Complaint  Patient presents with  . Other    new pt- eval axillary hidradenitis    HPI Yvonne Fisher is a 36 y.o. female.   HPI This is a very pleasant female referred by Dr. 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 She has had it in both axilla but has only had had multiple incision and drainages of the left axilla. Her most recent episode lasted over a month before it finally healed with antibiotics and incision and drainage. She currently is doing well. She denies fevers. She has had hidradenitis in the groins before but this is now resolved Past Medical History  Diagnosis Date  . Acid reflux   . Arthritis     right elbow   . Hidradenitis     Past Surgical History  Procedure Date  . Elbow arthroscopy 1997    right elbow  . Other surgical history     in office scraping of cervix   . Cholecystectomy 04/15/2011    Procedure: LAPAROSCOPIC CHOLECYSTECTOMY WITH INTRAOPERATIVE CHOLANGIOGRAM;  Surgeon: Ernestene Mention, MD;  Location: [facility];  Service: General;  Laterality: N/A;  laparoscopic cholecystectomy with cholangiogram    History reviewed. No pertinent family history.  Social History History  Substance Use Topics  . Smoking status: Never Smoker   . Smokeless tobacco: Never Used  . Alcohol Use: Yes     occasional     Allergies  Allergen Reactions  . Lansoprazole     Hives      No current outpatient prescriptions on file.    Review of Systems Review of Systems  Constitutional: Negative for fever, chills and unexpected weight change.  HENT: Negative for hearing loss, congestion, sore throat, trouble swallowing and voice change.   Eyes: Negative for visual disturbance.  Respiratory: Negative for cough and wheezing.   Cardiovascular: Negative for chest pain, palpitations and leg swelling.  Gastrointestinal: Negative for nausea, vomiting, abdominal pain, diarrhea,  constipation, blood in stool, abdominal distention and anal bleeding.  Genitourinary: Negative for hematuria, vaginal bleeding and difficulty urinating.  Musculoskeletal: Negative for arthralgias.  Skin: Negative for rash and wound.  Neurological: Negative for seizures, syncope and headaches.  Hematological: Negative for adenopathy. Does not bruise/bleed easily.  Psychiatric/Behavioral: Negative for confusion.    Blood pressure 122/70, pulse 76, temperature 97.8 F (36.6 C), temperature source Temporal, resp. rate 14, height 5\' 9"  (1.753 m), weight 232 lb 12.8 oz (105.597 kg).  Physical Exam Physical Exam  Constitutional: She is oriented to person, place, and time. She appears well-developed and well-nourished. No distress.  HENT:  Head: Normocephalic and atraumatic.  Right Ear: External ear normal.  Left Ear: External ear normal.  Nose: Nose normal.  Mouth/Throat: Oropharynx is clear and moist.  Eyes: Conjunctivae are normal. Pupils are equal, round, and reactive to light.  Neck: Normal range of motion. Neck supple. No tracheal deviation present.  Cardiovascular: Normal rate, regular rhythm, normal heart sounds and intact distal pulses.   Pulmonary/Chest: Effort normal and breath sounds normal. She has no wheezes. She has no rales.  Neurological: She is alert and oriented to person, place, and time.  Skin: Skin is warm and dry. There is erythema.       There are mild changes of chronic hidradenitis in both her axilla. There are no current draining abscesses. The left axilla is much worse than the right axilla  Psychiatric: Her  behavior is normal. Judgment normal.    Data Reviewed  Assessment    Axillary hidradenitis    Plan    We will proceed with wide excision of the hidradenitis in the left axilla as this is her worst area and the site of multiple incision and drainages. I'm going to put her on Cipro preoperatively. I discussed the risks of surgery which includes but is not  limited to bleeding, infection, injury to shredding structures, recurrence, wound breakdown, having a chronic draining wound, etc. She understands and wishes to proceed. Likelihood of success is moderate       Vadhir Mcnay A 11/21/2011, 4:11 PM

## 2011-11-27 DIAGNOSIS — L732 Hidradenitis suppurativa: Secondary | ICD-10-CM

## 2011-11-27 HISTORY — DX: Hidradenitis suppurativa: L73.2

## 2011-12-01 ENCOUNTER — Encounter (HOSPITAL_BASED_OUTPATIENT_CLINIC_OR_DEPARTMENT_OTHER): Payer: Self-pay | Admitting: *Deleted

## 2011-12-01 DIAGNOSIS — R21 Rash and other nonspecific skin eruption: Secondary | ICD-10-CM

## 2011-12-01 HISTORY — DX: Rash and other nonspecific skin eruption: R21

## 2011-12-05 ENCOUNTER — Encounter (HOSPITAL_BASED_OUTPATIENT_CLINIC_OR_DEPARTMENT_OTHER)
Admission: RE | Disposition: A | Discharge status: Home / Self Care | Payer: Self-pay | Source: Ambulatory Visit | Attending: Surgery

## 2011-12-05 ENCOUNTER — Ambulatory Visit (HOSPITAL_BASED_OUTPATIENT_CLINIC_OR_DEPARTMENT_OTHER)
Admission: RE | Admit: 2011-12-05 | Discharge: 2011-12-05 | Disposition: A | Discharge status: Home / Self Care | Payer: 59 | Source: Ambulatory Visit | Attending: Surgery | Admitting: Surgery

## 2011-12-05 ENCOUNTER — Encounter (HOSPITAL_BASED_OUTPATIENT_CLINIC_OR_DEPARTMENT_OTHER): Payer: Self-pay | Admitting: Anesthesiology

## 2011-12-05 ENCOUNTER — Ambulatory Visit (HOSPITAL_BASED_OUTPATIENT_CLINIC_OR_DEPARTMENT_OTHER): Payer: 59 | Admitting: Anesthesiology

## 2011-12-05 ENCOUNTER — Encounter (HOSPITAL_BASED_OUTPATIENT_CLINIC_OR_DEPARTMENT_OTHER): Payer: Self-pay

## 2011-12-05 DIAGNOSIS — K219 Gastro-esophageal reflux disease without esophagitis: Secondary | ICD-10-CM | POA: Insufficient documentation

## 2011-12-05 DIAGNOSIS — L732 Hidradenitis suppurativa: Secondary | ICD-10-CM | POA: Insufficient documentation

## 2011-12-05 DIAGNOSIS — M129 Arthropathy, unspecified: Secondary | ICD-10-CM | POA: Insufficient documentation

## 2011-12-05 HISTORY — PX: HYDRADENITIS EXCISION: SHX5243

## 2011-12-05 HISTORY — DX: Rash and other nonspecific skin eruption: R21

## 2011-12-05 LAB — POCT HEMOGLOBIN-HEMACUE: Hemoglobin: 12.7 g/dL (ref 12.0–15.0)

## 2011-12-05 SURGERY — EXCISION, HIDRADENITIS, AXILLA
Anesthesia: General | Site: Axilla | Laterality: Left | Wound class: Contaminated

## 2011-12-05 MED ORDER — SODIUM CHLORIDE 0.9 % IJ SOLN: 3.0000 mL | INTRAMUSCULAR | Status: DC | PRN

## 2011-12-05 MED ORDER — METOCLOPRAMIDE HCL 5 MG/ML IJ SOLN: 10.0000 mg | Freq: Once | INTRAMUSCULAR | Status: DC | PRN

## 2011-12-05 MED ORDER — OXYCODONE HCL 5 MG PO TABS: 5.0000 mg | ORAL_TABLET | Freq: Once | ORAL | Status: DC | PRN

## 2011-12-05 MED ORDER — ONDANSETRON HCL 4 MG/2ML IJ SOLN
INTRAMUSCULAR | Status: DC | PRN
  Administered 2011-12-05: 4 mg via INTRAVENOUS

## 2011-12-05 MED ORDER — HYDROMORPHONE HCL PF 1 MG/ML IJ SOLN
0.2500 mg | INTRAMUSCULAR | Status: DC | PRN
  Administered 2011-12-05 (×2): 0.5 mg via INTRAVENOUS

## 2011-12-05 MED ORDER — MIDAZOLAM HCL 5 MG/5ML IJ SOLN
INTRAMUSCULAR | Status: DC | PRN
  Administered 2011-12-05: 2 mg via INTRAVENOUS

## 2011-12-05 MED ORDER — SODIUM CHLORIDE 0.9 % IV SOLN: 250.0000 mL | INTRAVENOUS | Status: DC | PRN

## 2011-12-05 MED ORDER — SCOPOLAMINE 1 MG/3DAYS TD PT72
1.0000 | MEDICATED_PATCH | Freq: Once | TRANSDERMAL | Status: DC
  Administered 2011-12-05: 1.5 mg via TRANSDERMAL

## 2011-12-05 MED ORDER — LIDOCAINE HCL (CARDIAC) 20 MG/ML IV SOLN
INTRAVENOUS | Status: DC | PRN
  Administered 2011-12-05: 100 mg via INTRAVENOUS

## 2011-12-05 MED ORDER — FENTANYL CITRATE 0.05 MG/ML IJ SOLN
INTRAMUSCULAR | Status: DC | PRN
  Administered 2011-12-05 (×2): 25 ug via INTRAVENOUS

## 2011-12-05 MED ORDER — CEFAZOLIN SODIUM-DEXTROSE 2-3 GM-% IV SOLR
2.0000 g | INTRAVENOUS | Status: AC
  Administered 2011-12-05: 2 g via INTRAVENOUS

## 2011-12-05 MED ORDER — OXYCODONE HCL 5 MG/5ML PO SOLN: 5.0000 mg | Freq: Once | ORAL | Status: DC | PRN

## 2011-12-05 MED ORDER — DOXYCYCLINE HYCLATE 100 MG PO TABS: 100.0000 mg | ORAL_TABLET | Freq: Two times a day (BID) | ORAL | Status: DC

## 2011-12-05 MED ORDER — ACETAMINOPHEN 10 MG/ML IV SOLN
1000.0000 mg | Freq: Once | INTRAVENOUS | Status: AC
  Administered 2011-12-05: 1000 mg via INTRAVENOUS

## 2011-12-05 MED ORDER — BUPIVACAINE-EPINEPHRINE 0.5% -1:200000 IJ SOLN
INTRAMUSCULAR | Status: DC | PRN
  Administered 2011-12-05: 14 mL

## 2011-12-05 MED ORDER — DEXAMETHASONE SODIUM PHOSPHATE 4 MG/ML IJ SOLN
INTRAMUSCULAR | Status: DC | PRN
  Administered 2011-12-05: 10 mg via INTRAVENOUS

## 2011-12-05 MED ORDER — OXYCODONE HCL 5 MG PO TABS: 5.0000 mg | ORAL_TABLET | ORAL | Status: DC | PRN

## 2011-12-05 MED ORDER — SODIUM CHLORIDE 0.9 % IJ SOLN: 3.0000 mL | Freq: Two times a day (BID) | INTRAMUSCULAR | Status: DC

## 2011-12-05 MED ORDER — HYDROCODONE-ACETAMINOPHEN 5-325 MG PO TABS: 1.0000 | ORAL_TABLET | ORAL | Status: DC | PRN

## 2011-12-05 MED ORDER — METOCLOPRAMIDE HCL 5 MG/ML IJ SOLN
INTRAMUSCULAR | Status: DC | PRN
  Administered 2011-12-05: 10 mg via INTRAVENOUS

## 2011-12-05 MED ORDER — ACETAMINOPHEN 325 MG PO TABS: 650.0000 mg | ORAL_TABLET | ORAL | Status: DC | PRN

## 2011-12-05 MED ORDER — ACETAMINOPHEN 650 MG RE SUPP: 650.0000 mg | RECTAL | Status: DC | PRN

## 2011-12-05 MED ORDER — PROPOFOL 10 MG/ML IV EMUL
INTRAVENOUS | Status: DC | PRN
  Administered 2011-12-05: 250 mg via INTRAVENOUS

## 2011-12-05 MED ORDER — MORPHINE SULFATE 4 MG/ML IJ SOLN: 4.0000 mg | INTRAMUSCULAR | Status: DC | PRN

## 2011-12-05 MED ORDER — ONDANSETRON HCL 4 MG/2ML IJ SOLN: 4.0000 mg | Freq: Four times a day (QID) | INTRAMUSCULAR | Status: DC | PRN

## 2011-12-05 MED ORDER — LACTATED RINGERS IV SOLN
INTRAVENOUS | Status: DC
  Administered 2011-12-05 (×2): via INTRAVENOUS

## 2011-12-05 SURGICAL SUPPLY — 37 items
BLADE HEX COATED 2.75 (ELECTRODE) ×2 IMPLANT
BLADE SURG 15 STRL LF DISP TIS (BLADE) ×1 IMPLANT
BLADE SURG 15 STRL SS (BLADE) ×2
BLADE SURG ROTATE 9660 (MISCELLANEOUS) IMPLANT
CANISTER SUCTION 1200CC (MISCELLANEOUS) ×2 IMPLANT
CHLORAPREP W/TINT 26ML (MISCELLANEOUS) ×2 IMPLANT
CLOTH BEACON ORANGE TIMEOUT ST (SAFETY) ×2 IMPLANT
COVER MAYO STAND STRL (DRAPES) ×2 IMPLANT
COVER TABLE BACK 60X90 (DRAPES) ×2 IMPLANT
DECANTER SPIKE VIAL GLASS SM (MISCELLANEOUS) ×1 IMPLANT
DRAPE PED LAPAROTOMY (DRAPES) ×2 IMPLANT
DRAPE UTILITY XL STRL (DRAPES) ×2 IMPLANT
DRSG PAD ABDOMINAL 8X10 ST (GAUZE/BANDAGES/DRESSINGS) ×1 IMPLANT
ELECT REM PT RETURN 9FT ADLT (ELECTROSURGICAL) ×2
ELECTRODE REM PT RTRN 9FT ADLT (ELECTROSURGICAL) ×1 IMPLANT
GAUZE SPONGE 4X4 12PLY STRL LF (GAUZE/BANDAGES/DRESSINGS) ×2 IMPLANT
GLOVE SURG SIGNA 7.5 PF LTX (GLOVE) ×2 IMPLANT
GOWN PREVENTION PLUS XLARGE (GOWN DISPOSABLE) ×2 IMPLANT
GOWN PREVENTION PLUS XXLARGE (GOWN DISPOSABLE) ×3 IMPLANT
NDL HYPO 25X1 1.5 SAFETY (NEEDLE) ×1 IMPLANT
NEEDLE HYPO 25X1 1.5 SAFETY (NEEDLE) ×2 IMPLANT
NS IRRIG 1000ML POUR BTL (IV SOLUTION) ×1 IMPLANT
PACK BASIN DAY SURGERY FS (CUSTOM PROCEDURE TRAY) ×2 IMPLANT
PENCIL BUTTON HOLSTER BLD 10FT (ELECTRODE) ×2 IMPLANT
SLEEVE SCD COMPRESS KNEE MED (MISCELLANEOUS) IMPLANT
SPONGE LAP 18X18 X RAY DECT (DISPOSABLE) ×1 IMPLANT
SPONGE LAP 4X18 X RAY DECT (DISPOSABLE) ×2 IMPLANT
SUT ETHILON 2 0 FS 18 (SUTURE) ×4 IMPLANT
SUT ETHILON 3 0 FSL (SUTURE) IMPLANT
SYR BULB 3OZ (MISCELLANEOUS) ×2 IMPLANT
SYR CONTROL 10ML LL (SYRINGE) ×2 IMPLANT
TAPE PAPER 2X10 WHT MICROPORE (GAUZE/BANDAGES/DRESSINGS) ×1 IMPLANT
TOWEL OR 17X24 6PK STRL BLUE (TOWEL DISPOSABLE) ×2 IMPLANT
TOWEL OR NON WOVEN STRL DISP B (DISPOSABLE) ×2 IMPLANT
TUBE CONNECTING 20X1/4 (TUBING) ×2 IMPLANT
WATER STERILE IRR 1000ML POUR (IV SOLUTION) ×2 IMPLANT
YANKAUER SUCT BULB TIP NO VENT (SUCTIONS) ×2 IMPLANT

## 2011-12-05 NOTE — Anesthesia Procedure Notes (Signed)
Procedure Name: LMA Insertion Date/Time: 12/05/2011 12:32 PM Performed by: Gar Gibbon Pre-anesthesia Checklist: Patient identified, Emergency Drugs available, Suction available and Patient being monitored Patient Re-evaluated:Patient Re-evaluated prior to inductionOxygen Delivery Method: Circle System Utilized Preoxygenation: Pre-oxygenation with 100% oxygen Intubation Type: IV induction Ventilation: Mask ventilation without difficulty LMA: LMA inserted LMA Size: 5.0 Number of attempts: 1 Airway Equipment and Method: bite block Placement Confirmation: positive ETCO2 Tube secured with: Tape Dental Injury: Teeth and Oropharynx as per pre-operative assessment

## 2011-12-05 NOTE — Transfer of Care (Signed)
Immediate Anesthesia Transfer of Care Note  Patient: Yvonne Fisher  Procedure(s) Performed: Procedure(s) (LRB): EXCISION HYDRADENITIS AXILLA (Left)  Patient Location: PACU  Anesthesia Type: General  Level of Consciousness: sedated  Airway & Oxygen Therapy: Patient Spontanous Breathing and Patient connected to face mask oxygen  Post-op Assessment: Report given to PACU RN and Post -op Vital signs reviewed and stable  Post vital signs: Reviewed and stable  Complications: No apparent anesthesia complications

## 2011-12-05 NOTE — H&P (Signed)
Patient ID: Yvonne Fisher, female DOB: [birth year], 35 y.o. MRN: [id number]  Chief Complaint   Patient presents with   .  Other     new pt- eval axillary hidradenitis    HPI  Yvonne Fisher is a 36 y.o. female.  HPI  This is a very pleasant female referred by Dr. 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 She has had it in both axilla but has only had had multiple incision and drainages of the left axilla. Her most recent episode lasted over a month before it finally healed with antibiotics and incision and drainage. She currently is doing well. She denies fevers. She has had hidradenitis in the groins before but this is now resolved  Past Medical History   Diagnosis  Date   .  Acid reflux    .  Arthritis      right elbow   .  Hidradenitis     Past Surgical History   Procedure  Date   .  Elbow arthroscopy  1997     right elbow   .  Other surgical history      in office scraping of cervix   .  Cholecystectomy  04/15/2011     Procedure: LAPAROSCOPIC CHOLECYSTECTOMY WITH INTRAOPERATIVE CHOLANGIOGRAM; Surgeon: Ernestene Mention, MD; Location: [facility]; Service: General; Laterality: N/A; laparoscopic cholecystectomy with cholangiogram    History reviewed. No pertinent family history.  Social History  History   Substance Use Topics   .  Smoking status:  Never Smoker   .  Smokeless tobacco:  Never Used   .  Alcohol Use:  Yes      occasional    Allergies   Allergen  Reactions   .  Lansoprazole      Hives    No current outpatient prescriptions on file.    Review of Systems  Review of Systems  Constitutional: Negative for fever, chills and unexpected weight change.  HENT: Negative for hearing loss, congestion, sore throat, trouble swallowing and voice change.  Eyes: Negative for visual disturbance.  Respiratory: Negative for cough and wheezing.  Cardiovascular: Negative for chest pain, palpitations and leg swelling.  Gastrointestinal: Negative for nausea, vomiting,  abdominal pain, diarrhea, constipation, blood in stool, abdominal distention and anal bleeding.  Genitourinary: Negative for hematuria, vaginal bleeding and difficulty urinating.  Musculoskeletal: Negative for arthralgias.  Skin: Negative for rash and wound.  Neurological: Negative for seizures, syncope and headaches.  Hematological: Negative for adenopathy. Does not bruise/bleed easily.  Psychiatric/Behavioral: Negative for confusion.   Blood pressure 122/70, pulse 76, temperature 97.8 F (36.6 C), temperature source Temporal, resp. rate 14, height 5\' 9"  (1.753 m), weight 232 lb 12.8 oz (105.597 kg).  Physical Exam  Physical Exam  Constitutional: She is oriented to person, place, and time. She appears well-developed and well-nourished. No distress.  HENT:  Head: Normocephalic and atraumatic.  Right Ear: External ear normal.  Left Ear: External ear normal.  Nose: Nose normal.  Mouth/Throat: Oropharynx is clear and moist.  Eyes: Conjunctivae are normal. Pupils are equal, round, and reactive to light.  Neck: Normal range of motion. Neck supple. No tracheal deviation present.  Cardiovascular: Normal rate, regular rhythm, normal heart sounds and intact distal pulses.  Pulmonary/Chest: Effort normal and breath sounds normal. She has no wheezes. She has no rales.  Neurological: She is alert and oriented to person, place, and time.  Skin: Skin is warm and dry. There is erythema.  There are mild changes of chronic  hidradenitis in both her axilla. There are no current draining abscesses. The left axilla is much worse than the right axilla  Psychiatric: Her behavior is normal. Judgment normal.   Data Reviewed  Assessment   Axillary hidradenitis   Plan   We will proceed with wide excision of the hidradenitis in the left axilla as this is her worst area and the site of multiple incision and drainages. I'm going to put her on Cipro preoperatively. I discussed the risks of surgery which includes  but is not limited to bleeding, infection, injury to shredding structures, recurrence, wound breakdown, having a chronic draining wound, etc. She understands and wishes to proceed. Likelihood of success is moderate   Kellie Murrill A

## 2011-12-05 NOTE — Anesthesia Postprocedure Evaluation (Signed)
Anesthesia Post Note  Patient: Yvonne Fisher  Procedure(s) Performed: Procedure(s) (LRB): EXCISION HYDRADENITIS AXILLA (Left)  Anesthesia type: General  Patient location: PACU  Post pain: Pain level controlled  Post assessment: Patient's Cardiovascular Status Stable  Last Vitals:  Filed Vitals:   12/05/11 1425  BP: 128/69  Pulse: 73  Temp: 36.8 C  Resp: 16    Post vital signs: Reviewed and stable  Level of consciousness: alert  Complications: No apparent anesthesia complications

## 2011-12-05 NOTE — Interval H&P Note (Signed)
History and Physical Interval Note: no change in h and p  12/05/2011 12:06 PM  Yvonne Fisher  has presented today for surgery, with the diagnosis of axillary hidradenitis  The various methods of treatment have been discussed with the patient and family. After consideration of risks, benefits and other options for treatment, the patient has consented to  Procedure(s) (LRB): EXCISION HYDRADENITIS AXILLA (Left) as a surgical intervention .  The patient's history has been reviewed, patient examined, no change in status, stable for surgery.  I have reviewed the patient's chart and labs.  Questions were answered to the patient's satisfaction.     Karleen Seebeck A

## 2011-12-05 NOTE — Progress Notes (Signed)
Patient with c/o pressure to chest (5/10) after administration of Dilaudid - vital signs WNL at present time. Dr. Gelene Mink MDA notified-will continue to monitor patient closely and no further intervention needed at present as per Dr. Gelene Mink.

## 2011-12-05 NOTE — Anesthesia Preprocedure Evaluation (Signed)
Anesthesia Evaluation  Patient identified by MRN, date of birth, ID band Patient awake    Reviewed: Allergy & Precautions, H&P , NPO status , Patient's Chart, lab work & pertinent test results, reviewed documented beta blocker date and time   Airway Mallampati: II TM Distance: >3 FB Neck ROM: full    Dental   Pulmonary neg pulmonary ROS,  breath sounds clear to auscultation        Cardiovascular negative cardio ROS  Rhythm:regular     Neuro/Psych negative neurological ROS  negative psych ROS   GI/Hepatic negative GI ROS, Neg liver ROS,   Endo/Other  negative endocrine ROS  Renal/GU negative Renal ROS  negative genitourinary   Musculoskeletal   Abdominal   Peds  Hematology negative hematology ROS (+)   Anesthesia Other Findings See surgeon's H&P   Reproductive/Obstetrics negative OB ROS                           Anesthesia Physical Anesthesia Plan  ASA: II  Anesthesia Plan: General   Post-op Pain Management:    Induction: Intravenous  Airway Management Planned: LMA  Additional Equipment:   Intra-op Plan:   Post-operative Plan: Extubation in OR  Informed Consent: I have reviewed the patients History and Physical, chart, labs and discussed the procedure including the risks, benefits and alternatives for the proposed anesthesia with the patient or authorized representative who has indicated his/her understanding and acceptance.   Dental Advisory Given  Plan Discussed with: CRNA and Surgeon  Anesthesia Plan Comments:         Anesthesia Quick Evaluation  

## 2011-12-05 NOTE — Op Note (Signed)
EXCISION HYDRADENITIS AXILLA  Procedure Note  AXELLE SZWED 12/05/2011   Pre-op Diagnosis: axillary hidradenitis left       Post-op Diagnosis: same  Procedure(s):  WIDE EXCISION HYDRADENITIS LEFT AXILLA(8CM)  Surgeon(s): Shelly Rubenstein, MD  Anesthesia: General  Staff:  Joylene Grapes, RN - Relief Scrub Chrystine Oiler, RN - Circulator Dianne Despina Hidden, RN - Scrub Person  Estimated Blood Loss: Minimal               Procedure: The patient was brought to the operating room and identified as the correct patient. She was placed supine on the operating room table and general anesthesia was induced. Her left axilla was prepped and draped in the usual sterile fashion. I performed an 8 cm wide excision of a large area of hidradenitis in the axilla. I took this down into the axillary tissue with electrocautery. The entire specimen was then completely removed and sent to pathology for evaluation. I only had to slightly undermined the skin circumferentially. Hemostasis was achieved with cautery. I anesthetized with Marcaine. I then closed the incision with interrupted 2-0 nylon sutures. Gauze and tape were applied. The patient tolerated the procedure well. All the counts were correct at the end of the procedure. The patient was then extubated in the operating room and taken in a stable condition to the recovery room.          Gaynor Ferreras A   Date: 12/05/2011  Time: 12:56 PM

## 2011-12-08 ENCOUNTER — Telehealth (INDEPENDENT_AMBULATORY_CARE_PROVIDER_SITE_OTHER): Payer: Self-pay | Admitting: General Surgery

## 2011-12-08 ENCOUNTER — Emergency Department (HOSPITAL_COMMUNITY)
Admission: EM | Admit: 2011-12-08 | Discharge: 2011-12-08 | Disposition: A | Discharge status: Home / Self Care | Payer: 59 | Attending: Emergency Medicine | Admitting: Emergency Medicine

## 2011-12-08 ENCOUNTER — Encounter (HOSPITAL_BASED_OUTPATIENT_CLINIC_OR_DEPARTMENT_OTHER): Payer: Self-pay | Admitting: Surgery

## 2011-12-08 DIAGNOSIS — T819XXA Unspecified complication of procedure, initial encounter: Secondary | ICD-10-CM

## 2011-12-08 DIAGNOSIS — Y838 Other surgical procedures as the cause of abnormal reaction of the patient, or of later complication, without mention of misadventure at the time of the procedure: Secondary | ICD-10-CM | POA: Insufficient documentation

## 2011-12-08 DIAGNOSIS — IMO0002 Reserved for concepts with insufficient information to code with codable children: Secondary | ICD-10-CM | POA: Insufficient documentation

## 2011-12-08 NOTE — ED Provider Notes (Signed)
History     CSN: [id number]  Arrival date & time 12/08/11  1717   None     Chief Complaint  Patient presents with  . Wound Check    (Consider location/radiation/quality/duration/timing/severity/associated sxs/prior treatment) HPI  Pt to the ER with concerns of post operative complication. She noticed that bloody/clear fluid drained out of the wound in her left underarm. Dr. Magnus Ivan performed surgery on her left axilla for recurrent abscesses on Friday. She said the fluid had no odor to it. She denies increased pain or fevers/weakness. She denies any other complications. VS WNL.   Past Medical History  Diagnosis Date  . Arthritis     right elbow   . Hidradenitis 11/2011    left axilla  . Rash 12/01/2011    bilat. axilla  . Cough 12/01/2011    prod. of yellow sputum    Past Surgical History  Procedure Date  . Elbow arthroscopy 1997    right elbow  . Cholecystectomy 04/15/2011    Procedure: LAPAROSCOPIC CHOLECYSTECTOMY WITH INTRAOPERATIVE CHOLANGIOGRAM;  Surgeon: Ernestene Mention, MD;  Location: [facility];  Service: General;  Laterality: N/A;  laparoscopic cholecystectomy with cholangiogram  . Hydradenitis excision 12/05/2011    Procedure: EXCISION HYDRADENITIS AXILLA;  Surgeon: Shelly Rubenstein, MD;  Location: [facility];  Service: General;  Laterality: Left;  wide excision hidradenitis left axilla    History reviewed. No pertinent family history.  History  Substance Use Topics  . Smoking status: Never Smoker   . Smokeless tobacco: Never Used  . Alcohol Use: Yes     occasionally    OB History    Grav Para Term Preterm Abortions TAB SAB Ect Mult Living                  Review of Systems   HEENT: denies blurry vision or change in hearing PULMONARY: Denies difficulty breathing and SOB CARDIAC: denies chest pain or heart palpitations MUSCULOSKELETAL:  denies being unable to ambulate ABDOMEN AL: denies abdominal pain GU: denies loss of bowel or  urinary control NEURO: denies numbness and tingling in extremities PSYCH: patient denies anxiety or depression. NECK: Pt denies having neck pain     Allergies  Adhesive and Lansoprazole  Home Medications   Current Outpatient Rx  Name Route Sig Dispense Refill  . DOXYCYCLINE HYCLATE 100 MG PO TABS Oral Take 100 mg by mouth 2 (two) times daily.    Marland Kitchen HYDROCODONE-ACETAMINOPHEN 5-325 MG PO TABS Oral Take 1 tablet by mouth every 6 (six) hours as needed. Pain      BP 133/98  Pulse 84  Temp 97.9 F (36.6 C) (Oral)  Resp 20  SpO2 100%  LMP 12/03/2011  Physical Exam  Nursing note and vitals reviewed. Constitutional: She appears well-developed and well-nourished. No distress.  HENT:  Head: Normocephalic and atraumatic.  Eyes: Pupils are equal, round, and reactive to light.  Neck: Normal range of motion. Neck supple.  Cardiovascular: Normal rate and regular rhythm.   Pulmonary/Chest: Effort normal.  Abdominal: Soft.  Lymphadenopathy:       Pt has multiple suture to left lateral axilla region. They are intact, well healing without signs of infection. No fluid expressed on palpation, no significant tenderness or fluctuance.  No puss or erythema.  Neurological: She is alert.  Skin: Skin is warm and dry.    ED Course  Procedures (including critical care time)  Labs Reviewed - No data to display No results found.   1. Post-operative  complication       MDM  Pt wound is well healing. Fluid serosanguinous without signs of infection. Pt reassured. Pt told signs sx of fluid that is concerning.  Pt has appointment with Dr. Magnus Ivan coming up on August 21. She needs to attend that appointment for wound check and suture removal.  Pt has been advised of the symptoms that warrant their return to the ED. Patient has voiced understanding and has agreed to follow-up with the PCP or specialist.          Dorthula Matas, PA 12/08/11 1849

## 2011-12-08 NOTE — Telephone Encounter (Signed)
Pt called to report she raised her arm, felt something "give" and had blood and fluid rush out of the operative area under her arm, running all down her body.  She thinks the stitches are broken.  Advised to go to ER for evaluation.  She understands and will comply.

## 2011-12-08 NOTE — ED Notes (Signed)
Pt reports having surgery to left underarm on Friday. States today moved to pick up her son and felt some fluid leaking around sutures.  Area looks clean and intact, small amount of serosanguinous drainage noted.  NAD

## 2011-12-09 NOTE — ED Provider Notes (Signed)
Medical screening examination/treatment/procedure(s) were performed by non-physician practitioner and as supervising physician I was immediately available for consultation/collaboration.   Laray Anger, DO 12/09/11 1521

## 2011-12-17 ENCOUNTER — Ambulatory Visit (INDEPENDENT_AMBULATORY_CARE_PROVIDER_SITE_OTHER): Payer: 59 | Admitting: Surgery

## 2011-12-17 ENCOUNTER — Encounter (INDEPENDENT_AMBULATORY_CARE_PROVIDER_SITE_OTHER): Payer: Self-pay | Admitting: Surgery

## 2011-12-17 VITALS — BP 148/80 | HR 82 | Temp 97.6°F | Ht 69.0 in | Wt 229.8 lb

## 2011-12-17 DIAGNOSIS — Z09 Encounter for follow-up examination after completed treatment for conditions other than malignant neoplasm: Secondary | ICD-10-CM

## 2011-12-17 NOTE — Progress Notes (Signed)
Subjective:     Patient ID: Yvonne Fisher, female   DOB: Dec 31, 1975, 36 y.o.   MRN: [id number]  HPI She is here for her first postoperative check status post wide excision of hidradenitis of left axilla. She still is having some moderate drainage. She finished the doxycycline  Review of Systems     Objective:   Physical Exam    Her sutures are intact. There is moderate erythema which looks more like scar with skin than actual infection Assessment:     Patient status post wide excision hidradenitis left axilla    Plan:     I'm afraid to take the sutures out as I'm afraid the wound will break down. I will leave him in one more week. I'm going to start her on Keflex.

## 2011-12-26 ENCOUNTER — Encounter (INDEPENDENT_AMBULATORY_CARE_PROVIDER_SITE_OTHER): Payer: Self-pay | Admitting: Surgery

## 2011-12-26 ENCOUNTER — Ambulatory Visit (INDEPENDENT_AMBULATORY_CARE_PROVIDER_SITE_OTHER): Payer: 59 | Admitting: Surgery

## 2011-12-26 VITALS — BP 119/80 | HR 88 | Temp 98.8°F | Resp 16 | Ht 69.0 in | Wt 235.0 lb

## 2011-12-26 DIAGNOSIS — Z09 Encounter for follow-up examination after completed treatment for conditions other than malignant neoplasm: Secondary | ICD-10-CM

## 2011-12-26 NOTE — Progress Notes (Signed)
Subjective:     Patient ID: Yvonne Fisher, female   DOB: [birth year], 36 y.o.   MRN: [id number]  HPI She is here for another postop visit status post wide excision of hidradenitis in the axilla.  She is still having some drainage from the left axilla. She remained on antibiotics  Review of Systems     Objective:   Physical Exam    on exam, the incision is less erythematous. I removed the sutures. Some areas which I treated with Dermabond Assessment:     Patient stable postop    Plan:     Currently, she is going to try to go back to work on September 9. She will call me that morning and come see me in the office if she is still having drainage and I will have to delay her return to work.

## 2011-12-29 ENCOUNTER — Ambulatory Visit (INDEPENDENT_AMBULATORY_CARE_PROVIDER_SITE_OTHER): Payer: 59 | Admitting: Family Medicine

## 2011-12-29 VITALS — BP 110/78 | HR 91 | Temp 97.7°F | Resp 16 | Ht 68.5 in | Wt 233.4 lb

## 2011-12-29 DIAGNOSIS — L03119 Cellulitis of unspecified part of limb: Secondary | ICD-10-CM

## 2011-12-29 DIAGNOSIS — IMO0002 Reserved for concepts with insufficient information to code with codable children: Secondary | ICD-10-CM

## 2011-12-29 MED ORDER — KETOCONAZOLE 200 MG PO TABS: 200.0000 mg | ORAL_TABLET | Freq: Every day | ORAL | Status: DC

## 2011-12-29 NOTE — Progress Notes (Signed)
This is a 36 year old woman with a persistent abscess in her left axilla. She's been treated for 2 months now with a variety of antibiotics: Doxycycline, Septra, Bactrim, Keflex. Nevertheless the wound stays open and draining. She also has some mild erythema in the right axilla. She's been shaving under her arms throughout the summer.  Patient works in a [employer] and has been unable to work because of the copious drainage during this time.  Objective: Patient has a confluent erythematous lumpy rash under her left axilla with an open incision and along the lines of the incision is moderate maceration. She's unable to completely elevate her arm because of pain.  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    plan: Stop the Keflex, take ketoconazole tablets 200 mg daily, wash frequently with just soap and keep as dry as possible using gauze pads under the arm.  Recheck 5 days

## 2011-12-29 NOTE — Patient Instructions (Addendum)
Wash with [employer] several times a day.  Keep dry using guaze pads. Take Nizoral daily and return to clinic on Friday mid day.  You have a fungal infection now.  Which the antibiotics will not help, so I am giving you a anti fugal medication to help.

## 2012-01-02 ENCOUNTER — Ambulatory Visit (INDEPENDENT_AMBULATORY_CARE_PROVIDER_SITE_OTHER): Payer: 59 | Admitting: Physician Assistant

## 2012-01-02 VITALS — BP 129/79 | HR 83 | Temp 98.3°F | Resp 18 | Ht 68.0 in | Wt 231.0 lb

## 2012-01-02 DIAGNOSIS — L03112 Cellulitis of left axilla: Secondary | ICD-10-CM

## 2012-01-02 DIAGNOSIS — S41109A Unspecified open wound of unspecified upper arm, initial encounter: Secondary | ICD-10-CM

## 2012-01-02 DIAGNOSIS — M79602 Pain in left arm: Secondary | ICD-10-CM

## 2012-01-02 DIAGNOSIS — M79609 Pain in unspecified limb: Secondary | ICD-10-CM

## 2012-01-02 DIAGNOSIS — IMO0002 Reserved for concepts with insufficient information to code with codable children: Secondary | ICD-10-CM

## 2012-01-02 NOTE — Progress Notes (Signed)
I have called over to [facility] for patient, to see if she can be worked in today to be seen. I have spoken to [facility] their triage nurse and she can not get patient in today. The soonest she can get her in is Monday. Dr Magnus Ivan will see her at 20 on Monday. She advised we are to put dry dressing on area of incision and patient can take Ibuprofen for pain until she follows up on Monday.

## 2012-01-02 NOTE — Progress Notes (Signed)
  Subjective:    Patient ID: Yvonne Fisher, female    DOB: November 10, 1975, 36 y.o.   MRN: [id number]  HPI Pt presents to clinic for recheck from her visit with Dr. Milus Glazier this week.  She has stopped her abx, and tolerating the Ketoconazole ok for a presumed fungal infection in her axilla.  Her wound is still hurting but now the pain is radiating down towards to her elbow and it is worse with movement.  The pain seems to getting worse over the last couple of days and seems to pt to be in her muscle. I have reviewed her chart since her surgery and it looks like dermabond was placed on the wound 1 wk ago after her stitches removed.  Since then the wound has opened but is not draining.  She is keeping a dry gauze on the wound.  She has no fevers and no change in sensation or temperature in her fingers.  She has not been doing a lot of heavy lifting but she is using her arm quite a bit with her 6y/o son.   Review of Systems  Constitutional: Negative for fever and chills.  Skin: Positive for rash and wound.       Objective:   Physical Exam  Vitals reviewed. Constitutional: She is oriented to person, place, and time. She appears well-developed and well-nourished.  HENT:  Head: Normocephalic and atraumatic.  Right Ear: External ear normal.  Left Ear: External ear normal.  Pulmonary/Chest: Effort normal.  Musculoskeletal:       Left upper arm: She exhibits tenderness. She exhibits no swelling and no edema.       Arms:      Left hand: She exhibits no tenderness, normal capillary refill and no swelling. normal sensation noted.  Neurological: She is alert and oriented to person, place, and time.  Skin: Skin is warm and dry. Rash noted.       ~8cm strip of dermabond that is not attached to her skin was removed from her hairs.  The wound is ~6cm long and has a good granulation base.  There is surrounding red tissue somewhat macerated that appears to be fungal with some thick white d/c at the skin  folds.    Psychiatric: She has a normal mood and affect. Her behavior is normal. Judgment and thought content normal.          Assessment & Plan:   1. Open wound of axillary region with complication   2. Cellulitis of axilla, left   3. Arm pain, left    I called and spoke with Britta Mccreedy at [facility] who set the patient up with an appt on Monday.  Pt to continue to keep the area dry with a dry gauze drsg. She is to continue her medication. She is to try and keep the area open to decrease the moisture.  She can use NSAID for pain.  I believe the pain is related to change in arm movement and useage since the dermabond came off the wound and starting poking her wound which caused her increased pain.  If pt experience increased pain or any change in color or sensation in her fingers she is to RTC.  Otherwise f/u with surgeon on Monday.

## 2012-01-05 ENCOUNTER — Encounter (INDEPENDENT_AMBULATORY_CARE_PROVIDER_SITE_OTHER): Payer: Self-pay | Admitting: Surgery

## 2012-01-05 ENCOUNTER — Ambulatory Visit (INDEPENDENT_AMBULATORY_CARE_PROVIDER_SITE_OTHER): Payer: 59 | Admitting: Surgery

## 2012-01-05 VITALS — BP 126/85 | HR 80 | Temp 97.7°F | Resp 16 | Ht 69.0 in | Wt 229.2 lb

## 2012-01-05 DIAGNOSIS — Z09 Encounter for follow-up examination after completed treatment for conditions other than malignant neoplasm: Secondary | ICD-10-CM

## 2012-01-05 NOTE — Progress Notes (Signed)
Subjective:     Patient ID: Yvonne Fisher, female   DOB: 1975-12-07, 36 y.o.   MRN: [id number]  HPI  She is here for another postop visit. She is still having significant discomfort and pain in the left axilla. Review of Systems     Objective:   Physical Exam The wound is very clean-looking with excellent granulation tissue and no purulence    Assessment:     Patient status post wide excision of hidradenitis of left axilla    Plan:     The wound is contracting very well. I wanted to start putting Neosporin on the wound daily. She cannot work until this is healed. I will see her back in 2 weeks.

## 2012-01-19 ENCOUNTER — Encounter (INDEPENDENT_AMBULATORY_CARE_PROVIDER_SITE_OTHER): Payer: Self-pay | Admitting: Surgery

## 2012-01-19 ENCOUNTER — Ambulatory Visit (INDEPENDENT_AMBULATORY_CARE_PROVIDER_SITE_OTHER): Payer: 59 | Admitting: Surgery

## 2012-01-19 VITALS — BP 118/78 | HR 90 | Temp 97.5°F | Resp 18 | Ht 69.0 in | Wt 232.5 lb

## 2012-01-19 DIAGNOSIS — Z09 Encounter for follow-up examination after completed treatment for conditions other than malignant neoplasm: Secondary | ICD-10-CM

## 2012-01-19 NOTE — Progress Notes (Signed)
Subjective:     Patient ID: Yvonne Fisher, female   DOB: [birth year], 36 y.o.   MRN: [id number]  HPI She reports that she is still having significant discomfort in the left axilla with some pain in her arm which is nervelike especially when she tries to stretch or lift the arm.  She is doing wound care and reports a small amount of bleeding  Review of Systems     Objective:   Physical Exam On exam, the wound looks great. I treated the open area with silver nitrate.    Assessment:     Patient status post wide excision of left axillary hidradenitis    Plan:     She will continue her local wound care. She will start doing exercises to try to stretch the scarring in the axilla. As she can do no lifting currently, she will stet work until I see her back again which will be in approximately 2 weeks.

## 2012-02-05 ENCOUNTER — Ambulatory Visit (INDEPENDENT_AMBULATORY_CARE_PROVIDER_SITE_OTHER): Payer: 59 | Admitting: Surgery

## 2012-02-05 ENCOUNTER — Encounter (INDEPENDENT_AMBULATORY_CARE_PROVIDER_SITE_OTHER): Payer: Self-pay | Admitting: General Surgery

## 2012-02-05 ENCOUNTER — Encounter (INDEPENDENT_AMBULATORY_CARE_PROVIDER_SITE_OTHER): Payer: Self-pay | Admitting: Surgery

## 2012-02-05 ENCOUNTER — Ambulatory Visit: Payer: 59 | Attending: Surgery | Admitting: Physical Therapy

## 2012-02-05 VITALS — BP 118/68 | HR 72 | Temp 97.8°F | Resp 16 | Ht 69.0 in | Wt 236.2 lb

## 2012-02-05 DIAGNOSIS — M2550 Pain in unspecified joint: Secondary | ICD-10-CM | POA: Insufficient documentation

## 2012-02-05 DIAGNOSIS — M2560 Stiffness of unspecified joint, not elsewhere classified: Secondary | ICD-10-CM | POA: Insufficient documentation

## 2012-02-05 DIAGNOSIS — IMO0001 Reserved for inherently not codable concepts without codable children: Secondary | ICD-10-CM | POA: Insufficient documentation

## 2012-02-05 DIAGNOSIS — M6281 Muscle weakness (generalized): Secondary | ICD-10-CM | POA: Insufficient documentation

## 2012-02-05 DIAGNOSIS — M25619 Stiffness of unspecified shoulder, not elsewhere classified: Secondary | ICD-10-CM | POA: Insufficient documentation

## 2012-02-05 DIAGNOSIS — Z09 Encounter for follow-up examination after completed treatment for conditions other than malignant neoplasm: Secondary | ICD-10-CM

## 2012-02-05 NOTE — Progress Notes (Signed)
Subjective:     Patient ID: Yvonne Fisher, female   DOB: 02-Nov-1975, 36 y.o.   MRN: [id number]  HPI She is now 2 months status post wide excision of hidradenitis in her left axilla. She reports her wound is healed but she continued to have neurologic discomfort down the inner aspect of her left arm to the wrist.  She has tingling discomfort and some mild weakness of her wrist  Review of Systems     Objective:   Physical Exam On exam, her incision is well-healed. There are no open wounds. She can move her arm but there is discomfort at the antecubital fossa    Assessment:     Patient status post wide excision of hidradenitis in the axilla.    Plan:     Again, I am uncertain of the cause of his discomfort and neurologic symptoms in her arm. Her excision with superficial and I did not go into the extra tissue. This may be a stretch injury from where she was on the operating table. Because of her discomfort, she is still unable to work. I am going to refer her to physical therapy for evaluation. I may need to send her to a hand specialist if this discomfort continues. I will see her back in one month.

## 2012-02-11 ENCOUNTER — Telehealth (INDEPENDENT_AMBULATORY_CARE_PROVIDER_SITE_OTHER): Payer: Self-pay | Admitting: General Surgery

## 2012-02-11 ENCOUNTER — Ambulatory Visit: Payer: 59

## 2012-02-11 NOTE — Telephone Encounter (Signed)
Message copied by Wilder Glade on Wed Feb 11, 2012  4:34 PM ------      Message from: [place], [place]      Created: Wed Feb 11, 2012  4:02 PM      Regarding: Dr Magnus Ivan      Contact: [phone]       Pt want to know how many lympnodes was taken out of her. Please call pt with this concern.            Thanks

## 2012-02-11 NOTE — Telephone Encounter (Signed)
Called back to let her know that there was no lympnodes taken out, per Dr Magnus Ivan

## 2012-02-13 ENCOUNTER — Ambulatory Visit: Payer: 59 | Admitting: Physical Therapy

## 2012-02-16 ENCOUNTER — Ambulatory Visit: Payer: 59

## 2012-02-16 ENCOUNTER — Ambulatory Visit (INDEPENDENT_AMBULATORY_CARE_PROVIDER_SITE_OTHER): Payer: 59 | Admitting: Surgery

## 2012-02-16 ENCOUNTER — Encounter (INDEPENDENT_AMBULATORY_CARE_PROVIDER_SITE_OTHER): Payer: Self-pay | Admitting: Surgery

## 2012-02-16 VITALS — BP 120/70 | HR 84 | Temp 97.1°F | Resp 20 | Ht 69.0 in | Wt 237.6 lb

## 2012-02-16 DIAGNOSIS — Z09 Encounter for follow-up examination after completed treatment for conditions other than malignant neoplasm: Secondary | ICD-10-CM

## 2012-02-16 NOTE — Progress Notes (Signed)
Subjective:     Patient ID: Yvonne Fisher, female   DOB: 1976/03/06, 36 y.o.   MRN: [id number]  HPI She is back today because of increasing erythema in her left axilla and started in the past 24 hours. She has also noticed on her right axilla  Review of Systems     Objective:   Physical Exam There is moderate erythema in both her axillas with moderate tenderness. There are no obvious abscesses were draining areas.    Assessment:     Postoperative hidradenitis    Plan:     I will see her back in one week. I am starting Diflucan and Augmentin

## 2012-02-18 ENCOUNTER — Ambulatory Visit: Payer: 59 | Admitting: Physical Therapy

## 2012-02-23 ENCOUNTER — Ambulatory Visit: Payer: 59 | Admitting: Physical Therapy

## 2012-02-25 ENCOUNTER — Ambulatory Visit: Payer: 59 | Admitting: Physical Therapy

## 2012-02-26 ENCOUNTER — Ambulatory Visit (INDEPENDENT_AMBULATORY_CARE_PROVIDER_SITE_OTHER): Payer: 59 | Admitting: Surgery

## 2012-02-26 ENCOUNTER — Encounter (INDEPENDENT_AMBULATORY_CARE_PROVIDER_SITE_OTHER): Payer: Self-pay | Admitting: Surgery

## 2012-02-26 VITALS — BP 128/80 | HR 70 | Temp 96.0°F | Resp 14 | Ht 69.0 in | Wt 232.8 lb

## 2012-02-26 DIAGNOSIS — Z09 Encounter for follow-up examination after completed treatment for conditions other than malignant neoplasm: Secondary | ICD-10-CM

## 2012-02-26 NOTE — Progress Notes (Signed)
Subjective:     Patient ID: Yvonne Fisher, female   DOB: July 18, 1975, 36 y.o.   MRN: [id number]  HPI She reports less discomfort on antibiotics in both axilla. She is still doing physical therapy to improve her range of motion and strength. She has 4 days left of her Augmentin and Diflucan  Review of Systems     Objective:   Physical Exam On exam, the erythema and nodules in both axilla have regressed markedly    Assessment:     Chronic axillary hidradenitis    Plan:     I will continue the antibiotics for 2 more weeks. I will see her back in approximately 3 weeks

## 2012-03-01 ENCOUNTER — Ambulatory Visit: Payer: 59 | Attending: Surgery | Admitting: Physical Therapy

## 2012-03-01 DIAGNOSIS — IMO0001 Reserved for inherently not codable concepts without codable children: Secondary | ICD-10-CM | POA: Insufficient documentation

## 2012-03-01 DIAGNOSIS — M25619 Stiffness of unspecified shoulder, not elsewhere classified: Secondary | ICD-10-CM | POA: Insufficient documentation

## 2012-03-01 DIAGNOSIS — M2550 Pain in unspecified joint: Secondary | ICD-10-CM | POA: Insufficient documentation

## 2012-03-01 DIAGNOSIS — M6281 Muscle weakness (generalized): Secondary | ICD-10-CM | POA: Insufficient documentation

## 2012-03-01 DIAGNOSIS — M2560 Stiffness of unspecified joint, not elsewhere classified: Secondary | ICD-10-CM | POA: Insufficient documentation

## 2012-03-03 ENCOUNTER — Ambulatory Visit: Payer: 59 | Admitting: Physical Therapy

## 2012-03-05 ENCOUNTER — Ambulatory Visit: Payer: 59 | Admitting: Physical Therapy

## 2012-03-08 ENCOUNTER — Ambulatory Visit: Payer: 59 | Admitting: Physical Therapy

## 2012-03-10 ENCOUNTER — Ambulatory Visit: Payer: 59 | Admitting: Physical Therapy

## 2012-03-15 ENCOUNTER — Ambulatory Visit: Payer: 59 | Admitting: Physical Therapy

## 2012-03-15 ENCOUNTER — Ambulatory Visit (INDEPENDENT_AMBULATORY_CARE_PROVIDER_SITE_OTHER): Payer: 59 | Admitting: Surgery

## 2012-03-15 ENCOUNTER — Encounter (INDEPENDENT_AMBULATORY_CARE_PROVIDER_SITE_OTHER): Payer: Self-pay | Admitting: General Surgery

## 2012-03-15 ENCOUNTER — Encounter (INDEPENDENT_AMBULATORY_CARE_PROVIDER_SITE_OTHER): Payer: Self-pay | Admitting: Surgery

## 2012-03-15 DIAGNOSIS — L732 Hidradenitis suppurativa: Secondary | ICD-10-CM

## 2012-03-15 NOTE — Progress Notes (Signed)
Subjective:     Patient ID: Yvonne Fisher, female   DOB: Apr 17, 1976, 36 y.o.   MRN: [id number]  HPI She continues to see the physical therapist and her strength has improved greatly and the left arm. She is still on antibiotics and has no issues with open wounds  Review of Systems     Objective:   Physical Exam On exam, both wounds were completely closed and there are no firm or tender areas in either axilla    Assessment:     Chronic hidradenitis    Plan:     She will continue her course of antibiotics until they are done. I will see her back in 3 weeks. She is set to resume work on December 2

## 2012-03-17 ENCOUNTER — Ambulatory Visit: Payer: 59 | Admitting: Physical Therapy

## 2012-03-22 ENCOUNTER — Ambulatory Visit: Payer: 59 | Admitting: Physical Therapy

## 2012-03-24 ENCOUNTER — Ambulatory Visit: Payer: 59 | Admitting: Physical Therapy

## 2012-03-24 ENCOUNTER — Telehealth (INDEPENDENT_AMBULATORY_CARE_PROVIDER_SITE_OTHER): Payer: Self-pay | Admitting: General Surgery

## 2012-03-24 ENCOUNTER — Encounter (INDEPENDENT_AMBULATORY_CARE_PROVIDER_SITE_OTHER): Payer: Self-pay | Admitting: General Surgery

## 2012-03-24 NOTE — Telephone Encounter (Signed)
Faxed work note to the [employer]: [employer]. Fax number [phone]

## 2012-03-29 ENCOUNTER — Encounter: Payer: 59 | Admitting: Physical Therapy

## 2012-03-29 ENCOUNTER — Ambulatory Visit: Payer: 59 | Attending: Surgery | Admitting: Physical Therapy

## 2012-03-29 DIAGNOSIS — M2560 Stiffness of unspecified joint, not elsewhere classified: Secondary | ICD-10-CM | POA: Insufficient documentation

## 2012-03-29 DIAGNOSIS — M6281 Muscle weakness (generalized): Secondary | ICD-10-CM | POA: Insufficient documentation

## 2012-03-29 DIAGNOSIS — M2550 Pain in unspecified joint: Secondary | ICD-10-CM | POA: Insufficient documentation

## 2012-03-29 DIAGNOSIS — M25619 Stiffness of unspecified shoulder, not elsewhere classified: Secondary | ICD-10-CM | POA: Insufficient documentation

## 2012-03-29 DIAGNOSIS — IMO0001 Reserved for inherently not codable concepts without codable children: Secondary | ICD-10-CM | POA: Insufficient documentation

## 2012-03-31 ENCOUNTER — Encounter: Payer: 59 | Admitting: Physical Therapy

## 2012-03-31 ENCOUNTER — Ambulatory Visit: Payer: 59 | Admitting: Physical Therapy

## 2012-04-05 ENCOUNTER — Ambulatory Visit: Payer: 59 | Admitting: Physical Therapy

## 2012-04-07 ENCOUNTER — Ambulatory Visit: Payer: 59 | Admitting: Physical Therapy

## 2012-04-07 ENCOUNTER — Encounter (INDEPENDENT_AMBULATORY_CARE_PROVIDER_SITE_OTHER): Payer: Self-pay | Admitting: Surgery

## 2012-04-07 ENCOUNTER — Ambulatory Visit (INDEPENDENT_AMBULATORY_CARE_PROVIDER_SITE_OTHER): Payer: 59 | Admitting: Surgery

## 2012-04-07 VITALS — BP 126/86 | HR 70 | Temp 97.0°F | Resp 16 | Ht 69.0 in | Wt 238.0 lb

## 2012-04-07 DIAGNOSIS — L732 Hidradenitis suppurativa: Secondary | ICD-10-CM

## 2012-04-07 NOTE — Progress Notes (Signed)
Subjective:     Patient ID: Yvonne Fisher, female   DOB: 1976-01-04, 36 y.o.   MRN: [id number]  HPI Other than some mild discomfort, she is so was back to normal per her report. She has no areas of drainage  Review of Systems     Objective:   Physical Exam Her axilla is completely healed with no open areas    Assessment:     Hidradenitis    Plan:     She may return to normal activities. I will see her back as needed

## 2012-04-12 ENCOUNTER — Ambulatory Visit: Payer: 59 | Admitting: Physical Therapy

## 2012-04-14 ENCOUNTER — Ambulatory Visit: Payer: 59

## 2012-04-19 ENCOUNTER — Ambulatory Visit: Payer: 59 | Admitting: Physical Therapy

## 2012-04-22 ENCOUNTER — Ambulatory Visit: Payer: 59 | Admitting: Physical Therapy

## 2012-04-26 ENCOUNTER — Ambulatory Visit: Payer: 59 | Admitting: Physical Therapy

## 2013-02-09 ENCOUNTER — Inpatient Hospital Stay (HOSPITAL_COMMUNITY)
Admission: AD | Admit: 2013-02-09 | Discharge: 2013-02-09 | Disposition: A | Discharge status: Home / Self Care | Payer: 59 | Source: Ambulatory Visit | Attending: Obstetrics and Gynecology | Admitting: Obstetrics and Gynecology

## 2013-02-09 ENCOUNTER — Encounter (HOSPITAL_COMMUNITY): Payer: Self-pay | Admitting: *Deleted

## 2013-02-09 ENCOUNTER — Inpatient Hospital Stay (HOSPITAL_COMMUNITY): Payer: 59

## 2013-02-09 DIAGNOSIS — O3410 Maternal care for benign tumor of corpus uteri, unspecified trimester: Secondary | ICD-10-CM | POA: Insufficient documentation

## 2013-02-09 DIAGNOSIS — O4690 Antepartum hemorrhage, unspecified, unspecified trimester: Secondary | ICD-10-CM

## 2013-02-09 DIAGNOSIS — O3411 Maternal care for benign tumor of corpus uteri, first trimester: Secondary | ICD-10-CM

## 2013-02-09 DIAGNOSIS — D259 Leiomyoma of uterus, unspecified: Secondary | ICD-10-CM | POA: Insufficient documentation

## 2013-02-09 DIAGNOSIS — O209 Hemorrhage in early pregnancy, unspecified: Secondary | ICD-10-CM

## 2013-02-09 LAB — CBC WITH DIFFERENTIAL/PLATELET
Basophils Absolute: 0 10*3/uL (ref 0.0–0.1)
Eosinophils Relative: 1 % (ref 0–5)
HCT: 36.1 % (ref 36.0–46.0)
Lymphocytes Relative: 29 % (ref 12–46)
MCHC: 34.1 g/dL (ref 30.0–36.0)
MCV: 83.6 fL (ref 78.0–100.0)
Monocytes Absolute: 0.8 10*3/uL (ref 0.1–1.0)
RDW: 13.6 % (ref 11.5–15.5)
WBC: 9.9 10*3/uL (ref 4.0–10.5)

## 2013-02-09 LAB — URINALYSIS, ROUTINE W REFLEX MICROSCOPIC
Glucose, UA: NEGATIVE mg/dL
Protein, ur: NEGATIVE mg/dL

## 2013-02-09 LAB — URINE MICROSCOPIC-ADD ON

## 2013-02-09 NOTE — MAU Note (Signed)
Pt presents with complaints of bright red vaginal bleeding that started at 230 today. She says that she had a ultrasound on October the 3rd at Dr Berenda Morale office and was approximately 6wks and 3 days at that time.

## 2013-02-09 NOTE — MAU Provider Note (Signed)
History     CSN: [id number]  Arrival date and time: 02/09/13 1613   None     Chief Complaint  Patient presents with  . Vaginal Bleeding   HPI Yvonne Fisher is 37 y.o. [phone] [redacted]weeks gestation presenting with vaginal bleeding that began at 2:30pm today.  States it is heavy with small clots in the toilet.  Denies abdominal pain.  She is a patient of Dr. Senaida Fisher.  U/S in the office 10/3--viable pregnancy.  She said there was something on the U/S but the baby was fine.  Past Medical History  Diagnosis Date  . Arthritis     right elbow   . Hidradenitis 11/2011    left axilla  . Rash 12/01/2011    bilat. axilla  . Cough 12/01/2011    prod. of yellow sputum    Past Surgical History  Procedure Laterality Date  . Elbow arthroscopy  1997    right elbow  . Cholecystectomy  04/15/2011    Procedure: LAPAROSCOPIC CHOLECYSTECTOMY WITH INTRAOPERATIVE CHOLANGIOGRAM;  Surgeon: Ernestene Mention, MD;  Location: [facility];  Service: General;  Laterality: N/A;  laparoscopic cholecystectomy with cholangiogram  . Hydradenitis excision  12/05/2011    Procedure: EXCISION HYDRADENITIS AXILLA;  Surgeon: Shelly Rubenstein, MD;  Location: [facility];  Service: General;  Laterality: Left;  wide excision hidradenitis left axilla    History reviewed. No pertinent family history.  History  Substance Use Topics  . Smoking status: Never Smoker   . Smokeless tobacco: Never Used  . Alcohol Use: Yes     Comment: occasionally    Allergies:  Allergies  Allergen Reactions  . Adhesive [Tape] Other (See Comments)    TEARS SKIN  . Lansoprazole Hives         No prescriptions prior to admission    Review of Systems  Constitutional: Negative for fever and chills.  Gastrointestinal: Negative for abdominal pain.  Genitourinary: Negative for dysuria, urgency and frequency.       + for vaginal bleeding with clot  Neurological: Negative for headaches.   Physical Exam   Blood pressure  155/80, pulse 108, temperature 97.8 F (36.6 C), temperature source Oral, resp. rate 18, height 5\' 9"  (1.753 m), weight 242 lb (109.77 kg), last menstrual period 12/09/2012.  Physical Exam  Vitals reviewed. Constitutional: She is oriented to person, place, and time. She appears well-developed and well-nourished. No distress.  HENT:  Head: Normocephalic.  Neck: Normal range of motion.  Cardiovascular: Normal rate.   Respiratory: Effort normal.  GI: Soft. She exhibits no distension and no mass. There is no tenderness. There is no rebound and no guarding.  Genitourinary: There is no rash, tenderness or lesion on the right labia. There is no rash, tenderness or lesion on the left labia. Uterus is enlarged. Cervix exhibits no motion tenderness, no discharge and no friability. Bleeding: small amout.  Neurological: She is alert and oriented to person, place, and time.  Skin: Skin is warm and dry.  Psychiatric: She has a normal mood and affect. Her behavior is normal.   Results for orders placed during the hospital encounter of 02/09/13 (from the past 24 hour(s))  CBC WITH DIFFERENTIAL     Status: None   Collection Time    02/09/13  5:25 PM      Result Value Range   WBC 9.9  4.0 - 10.5 K/uL   RBC 4.32  3.87 - 5.11 MIL/uL   Hemoglobin 12.3  12.0 - 15.0  g/dL   HCT 16.1  09.6 - 04.5 %   MCV 83.6  78.0 - 100.0 fL   MCH 28.5  26.0 - 34.0 pg   MCHC 34.1  30.0 - 36.0 g/dL   RDW 40.9  81.1 - 91.4 %   Platelets 289  150 - 400 K/uL   Neutrophils Relative % 62  43 - 77 %   Neutro Abs 6.1  1.7 - 7.7 K/uL   Lymphocytes Relative 29  12 - 46 %   Lymphs Abs 2.8  0.7 - 4.0 K/uL   Monocytes Relative 8  3 - 12 %   Monocytes Absolute 0.8  0.1 - 1.0 K/uL   Eosinophils Relative 1  0 - 5 %   Eosinophils Absolute 0.1  0.0 - 0.7 K/uL   Basophils Relative 0  0 - 1 %   Basophils Absolute 0.0  0.0 - 0.1 K/uL  ABO/RH     Status: None   Collection Time    02/09/13  5:25 PM      Result Value Range   ABO/RH(D)  A POS    HCG, QUANTITATIVE, PREGNANCY     Status: Abnormal   Collection Time    02/09/13  5:25 PM      Result Value Range   hCG, Beta Chain, Quant, S 82056 (*) <5 mIU/mL   US Ob Comp Less 14 Wks  02/09/2013   CLINICAL DATA:  Initial encounter for vaginal bleeding. LMP 12/09/2012 (8 weeks 4 days, 1st trimester). Quantitative beta HCG pending. By patient report, office ultrasound on 01/28/2013 was normal.  EXAM: OBSTETRIC <14 WK ULTRASOUND  TECHNIQUE: Transabdominal ultrasound was performed for evaluation of the gestation as well as the maternal uterus and adnexal regions.  COMPARISON:  None.  FINDINGS: Intrauterine gestational sac: Single, normal in shape.  Yolk sac:  Visualized.  Embryo:  Visualized.  Cardiac Activity: Visualized.  Heart Rate: 179 bpm  CRL:   18  mm   8 w 2 d                  [place] EDC: 09/19/2012.  Maternal uterus/adnexae: No evidence of subchorionic hemorrhage. Approximate 3.6 x 3.9 x 4.0 cm posterior fundal fibroid immediately adjacent to the gestational sac, though not deforming the sac currently. Normal-appearing right ovary measuring approximately 2.3 x 1.4 x 1.8 cm. Normal-appearing left ovary measuring approximately 3.4 x 2.8 x 2.5 cm. No adnexal masses. No free pelvic fluid.  IMPRESSION: 1. Single live intrauterine embryo with estimated gestational age [redacted] weeks 2 days by crown-rump length, correlating well with the gestational age by LMP of 8 weeks 4 days. Ultrasound [facility] 09/19/2012. 2. No evidence of subchorionic hemorrhage. 3. Approximate 4 cm posterior fundal fibroid immediately adjacent to the gestational sac, not deforming the sac currently. 4. Normal-appearing ovaries. No adnexal masses or free pelvic fluid.   Electronically Signed   By: Hulan Saas M.D.   On: 02/09/2013 18:18  MAU Course  Procedures  MDM MSE reported to Dr. Raynelle Bring, ABO RH, BHCG and U/S ordered.   Reported lab results and U/S results to Dr. Senaida Fisher.  Instructed to have keep scheduled  appointment  Assessment and Plan  A:  Vaginal bleeding in early pregnancy      Viable pregnancy at [redacted]w[redacted]d       Posterior fundal fibroid adjacent to GS, not deforming the sac     Negative for [facility]  P:  Pelvic rest until seen by your doctor  Keep scheduled appointment for next week       Herold Salguero,EVE M 02/09/2013, 4:54 PM

## 2013-02-21 LAB — OB RESULTS CONSOLE GC/CHLAMYDIA
Chlamydia: NEGATIVE
Gonorrhea: NEGATIVE

## 2013-02-21 LAB — OB RESULTS CONSOLE HIV ANTIBODY (ROUTINE TESTING): HIV: NONREACTIVE

## 2013-02-21 LAB — OB RESULTS CONSOLE ABO/RH: RH TYPE: POSITIVE

## 2013-02-21 LAB — OB RESULTS CONSOLE HEPATITIS B SURFACE ANTIGEN: HEP B S AG: NEGATIVE

## 2013-02-21 LAB — OB RESULTS CONSOLE ANTIBODY SCREEN: Antibody Screen: NEGATIVE

## 2013-02-21 LAB — OB RESULTS CONSOLE RPR: RPR: NONREACTIVE

## 2013-02-21 LAB — OB RESULTS CONSOLE RUBELLA ANTIBODY, IGM: RUBELLA: IMMUNE

## 2013-03-01 ENCOUNTER — Inpatient Hospital Stay (HOSPITAL_COMMUNITY)
Admission: AD | Admit: 2013-03-01 | Discharge: 2013-03-01 | Disposition: A | Discharge status: Home / Self Care | Payer: 59 | Source: Ambulatory Visit | Attending: Obstetrics and Gynecology | Admitting: Obstetrics and Gynecology

## 2013-03-01 ENCOUNTER — Encounter (HOSPITAL_COMMUNITY): Payer: Self-pay | Admitting: *Deleted

## 2013-03-01 DIAGNOSIS — K089 Disorder of teeth and supporting structures, unspecified: Secondary | ICD-10-CM | POA: Insufficient documentation

## 2013-03-01 DIAGNOSIS — O209 Hemorrhage in early pregnancy, unspecified: Secondary | ICD-10-CM | POA: Insufficient documentation

## 2013-03-01 DIAGNOSIS — K068 Other specified disorders of gingiva and edentulous alveolar ridge: Secondary | ICD-10-CM

## 2013-03-01 DIAGNOSIS — O99891 Other specified diseases and conditions complicating pregnancy: Secondary | ICD-10-CM | POA: Insufficient documentation

## 2013-03-01 DIAGNOSIS — K056 Periodontal disease, unspecified: Secondary | ICD-10-CM

## 2013-03-01 LAB — URINE MICROSCOPIC-ADD ON

## 2013-03-01 LAB — URINALYSIS, ROUTINE W REFLEX MICROSCOPIC
Nitrite: NEGATIVE
Specific Gravity, Urine: 1.025 (ref 1.005–1.030)
Urobilinogen, UA: 0.2 mg/dL (ref 0.0–1.0)

## 2013-03-01 MED ORDER — ACETAMINOPHEN-CODEINE #3 300-30 MG PO TABS: 1.0000 | ORAL_TABLET | Freq: Four times a day (QID) | ORAL | Status: DC | PRN

## 2013-03-01 MED ORDER — ACETAMINOPHEN-CODEINE #3 300-30 MG PO TABS
1.0000 | ORAL_TABLET | Freq: Once | ORAL | Status: AC
  Administered 2013-03-01: 1 via ORAL
  Filled 2013-03-01: qty 1

## 2013-03-01 MED ORDER — AMOXICILLIN 500 MG PO CAPS: 500.0000 mg | ORAL_CAPSULE | Freq: Three times a day (TID) | ORAL | Status: DC

## 2013-03-01 NOTE — MAU Provider Note (Signed)
History     CSN: [id number]  Arrival date and time: 03/01/13 0946   None     Chief Complaint  Patient presents with  . Dental Pain   HPI Comments: Yvonne Fisher 37 y.o. [id number] at [redacted]w[redacted]d presents with complaints of right sided facial pain.  The pain started yesterday afternoon while she was at work. Initially, the pain was a throbbing sensation that progressed to shooting in nature and is generalized over the right side of her face.  She has taken tylenol, children ibuprofen, ora-gel, and warm compresses for management but without relief.  The pain is currently a 10/10.  She went to [employer] dentist this morning for evaluation.  She stated that the dentist would not evaluate or treat her because she was early in pregnancy.  She was instructed to come to MAU for treatment. She denies fever, nausea, vomiting, and chills.  She was also recently seen by her OBGYN for vaginal spotting.  She was told that "everything was fine."  The bleeding resolved several days ago (she cannot recall the exact date).  She reports having sexual intercourse after resolution of the bleeding.  This morning she noted a small amount of red discharge when wiping.  She denies abdominal pain, cramping, and other discharge.  Dental Pain     Past Medical History  Diagnosis Date  . Arthritis     right elbow   . Hidradenitis 11/2011    left axilla  . Rash 12/01/2011    bilat. axilla  . Cough 12/01/2011    prod. of yellow sputum    Past Surgical History  Procedure Laterality Date  . Elbow arthroscopy  1997    right elbow  . Cholecystectomy  04/15/2011    Procedure: LAPAROSCOPIC CHOLECYSTECTOMY WITH INTRAOPERATIVE CHOLANGIOGRAM;  Surgeon: Ernestene Mention, MD;  Location: [facility];  Service: General;  Laterality: N/A;  laparoscopic cholecystectomy with cholangiogram  . Hydradenitis excision  12/05/2011    Procedure: EXCISION HYDRADENITIS AXILLA;  Surgeon: Shelly Rubenstein, MD;  Location: [facility];   Service: General;  Laterality: Left;  wide excision hidradenitis left axilla    History reviewed. No pertinent family history.  History  Substance Use Topics  . Smoking status: Never Smoker   . Smokeless tobacco: Never Used  . Alcohol Use: Yes     Comment: occasionally    Allergies:  Allergies  Allergen Reactions  . Adhesive [Tape] Other (See Comments)    TEARS SKIN  . Lansoprazole Hives         Prescriptions prior to admission  Medication Sig Dispense Refill  . acetaminophen (TYLENOL) 500 MG tablet Take 500 mg by mouth every 6 (six) hours as needed.      . benzocaine (ORAJEL) 10 % mucosal gel Use as directed 1 application in the mouth or throat as needed for mouth pain.      Marland Kitchen ibuprofen (CHILDRENS MOTRIN) 100 MG/5ML suspension Take 200 mg by mouth every 4 (four) hours as needed for moderate pain.      . Prenatal Vit-Fe Fumarate-FA (PRENATAL MULTIVITAMIN) TABS tablet Take 1 tablet by mouth daily at 12 noon.        ROS Physical Exam   Blood pressure 140/67, pulse 86, temperature 98.7 F (37.1 C), temperature source Oral, resp. rate 18, height 5\' 9"  (1.753 m), weight 107.502 kg (237 lb), last menstrual period 12/09/2012.  Physical Exam  Constitutional: She is oriented to person, place, and time. She appears well-developed and well-nourished.  She appears distressed (appears uncomfortable).  HENT:  Head: Normocephalic.    Mouth/Throat:    Neck: Normal range of motion. Neck supple.  Cardiovascular: Normal rate, regular rhythm and normal heart sounds.   Respiratory: Effort normal and breath sounds normal.  Genitourinary: No bleeding around the vagina.  Neurological: She is alert and oriented to person, place, and time.  Skin: Skin is warm and dry.   +doppler FHR  MAU Course  Procedures  Assessment and Plan  Mouth/Gum Pain Vaginal Bleeding in Pregnancy - + Fetal heart tones  Plan: Discharge to home RX Amoxicillin 500 mg TID x 10 days. RX Tylenol#3 (10) Pt  states a different dentist will schedule appt with her > make appointment Reviewed bleeding precautions  [place], Meilissa E 03/01/2013, 11:24 AM

## 2013-03-01 NOTE — MAU Note (Signed)
Pt sent to MAU from dentist office with ? Infected gum or tooth.  Pt early pregnant.

## 2013-03-02 LAB — URINE CULTURE

## 2013-04-28 DIAGNOSIS — O139 Gestational [pregnancy-induced] hypertension without significant proteinuria, unspecified trimester: Secondary | ICD-10-CM

## 2013-04-28 HISTORY — DX: Gestational (pregnancy-induced) hypertension without significant proteinuria, unspecified trimester: O13.9

## 2013-04-28 NOTE — L&D Delivery Note (Signed)
Delivery Note Pt progressed very rapidly to complete dilation and was about a 0 station.  She was not completely comfortable as epidural had just been placed and felt the urge to push.  She pushed well for about 15 minutes and then at 11:12 AM a healthy female was delivered via Vaginal, Spontaneous Delivery (Presentation: Right Occiput Anterior).  APGAR: 8, 9; weight pending .   Placenta status: Intact, Spontaneous.  Cord: 3 vessels with the following complications: None.  Vaginal bleeding following delivery was noted to be excessive.  Would respond to fundal massage then the lower uterine segment would collect clot and begin bleeding again.  Cytotec 853mcg placed rectally after the LUS evacuated for the 3rd time of about 100cc clot.  Bladder emptied also.  After about 15 minutes of cytotec, the bleeding was improved.  Advised RN to be vigilent with checks for the next hour.   VSS and pt with starting hgb of 13+.  Anesthesia: Epidural  Episiotomy: None Lacerations: First degree perineal and small right labial Suture Repair: 2.0 3.0 vicryl rapide Est. Blood Loss (mL): 600cc  Mom to postpartum.  Baby to Couplet care / Skin to Skin.  [name] 09/14/2013, 11:54 AM

## 2013-06-29 ENCOUNTER — Encounter: Payer: 59 | Attending: Obstetrics and Gynecology

## 2013-06-29 VITALS — Ht 69.0 in | Wt 243.8 lb

## 2013-06-29 DIAGNOSIS — O99810 Abnormal glucose complicating pregnancy: Secondary | ICD-10-CM | POA: Insufficient documentation

## 2013-06-29 DIAGNOSIS — Z713 Dietary counseling and surveillance: Secondary | ICD-10-CM | POA: Diagnosis not present

## 2013-06-29 DIAGNOSIS — E119 Type 2 diabetes mellitus without complications: Secondary | ICD-10-CM

## 2013-06-29 NOTE — Progress Notes (Signed)
  Patient was seen on 06/29/13 for Gestational Diabetes self-management class at the [facility]. The following learning objectives were met by the patient during this course:   States the definition of Gestational Diabetes  States why dietary management is important in controlling blood glucose  Describes the effects of carbohydrates on blood glucose levels  Demonstrates ability to create a balanced meal plan  Demonstrates carbohydrate counting   States when to check blood glucose levels  Demonstrates proper blood glucose monitoring techniques  States the effect of stress and exercise on blood glucose levels  States the importance of limiting caffeine and abstaining from alcohol and smoking  Plan:  Aim for 2 Carb Choices per meal (30 grams) +/- 1 either way for breakfast Aim for 3 Carb Choices per meal (45 grams) +/- 1 either way from lunch and dinner Aim for 1-2 Carbs per snack Begin reading food labels for Total Carbohydrate and sugar grams of foods Consider  increasing your activity level by walking daily as tolerated Begin checking BG before breakfast and 1-2 hours after first bit of breakfast, lunch and dinner after  as directed by MD  Take medication  as directed by MD  Blood glucose monitor given: One Touch Ultra 2 Lot # [id number] X Exp: 06/2014 Blood glucose reading: 108  Patient instructed to monitor glucose levels: FBS: 60 - <90 1 hour: <140 2 hour: <120  Patient received the following handouts:  Nutrition Diabetes and Pregnancy  Carbohydrate Counting List  Meal Planning worksheet  Patient will be seen for follow-up as needed.

## 2013-06-30 ENCOUNTER — Inpatient Hospital Stay (HOSPITAL_COMMUNITY)
Admission: AD | Admit: 2013-06-30 | Discharge: 2013-06-30 | Disposition: A | Discharge status: Home / Self Care | Payer: 59 | Source: Ambulatory Visit | Attending: Obstetrics and Gynecology | Admitting: Obstetrics and Gynecology

## 2013-06-30 DIAGNOSIS — O4700 False labor before 37 completed weeks of gestation, unspecified trimester: Secondary | ICD-10-CM | POA: Insufficient documentation

## 2013-06-30 MED ORDER — BETAMETHASONE SOD PHOS & ACET 6 (3-3) MG/ML IJ SUSP
12.0000 mg | Freq: Once | INTRAMUSCULAR | Status: AC
Start: 2013-06-30 — End: 2013-06-30
  Administered 2013-06-30: 12 mg via INTRAMUSCULAR
  Filled 2013-06-30: qty 2

## 2013-07-01 ENCOUNTER — Inpatient Hospital Stay (HOSPITAL_COMMUNITY)
Admission: AD | Admit: 2013-07-01 | Discharge: 2013-07-01 | Disposition: A | Discharge status: Home / Self Care | Payer: 59 | Source: Ambulatory Visit | Attending: Obstetrics and Gynecology | Admitting: Obstetrics and Gynecology

## 2013-07-01 DIAGNOSIS — O4700 False labor before 37 completed weeks of gestation, unspecified trimester: Secondary | ICD-10-CM | POA: Diagnosis present

## 2013-07-01 MED ORDER — BETAMETHASONE SOD PHOS & ACET 6 (3-3) MG/ML IJ SUSP
12.0000 mg | Freq: Once | INTRAMUSCULAR | Status: AC
  Administered 2013-07-01: 12 mg via INTRAMUSCULAR
  Filled 2013-07-01: qty 2

## 2013-08-18 LAB — OB RESULTS CONSOLE GBS: STREP GROUP B AG: NEGATIVE

## 2013-09-12 ENCOUNTER — Telehealth (HOSPITAL_COMMUNITY): Payer: Self-pay | Admitting: *Deleted

## 2013-09-12 ENCOUNTER — Encounter (HOSPITAL_COMMUNITY): Payer: Self-pay | Admitting: *Deleted

## 2013-09-12 NOTE — Telephone Encounter (Signed)
Preadmission screen  

## 2013-09-14 ENCOUNTER — Encounter (HOSPITAL_COMMUNITY): Payer: Self-pay

## 2013-09-14 ENCOUNTER — Inpatient Hospital Stay (HOSPITAL_COMMUNITY): Payer: 59 | Admitting: Anesthesiology

## 2013-09-14 ENCOUNTER — Inpatient Hospital Stay (HOSPITAL_COMMUNITY)
Admission: RE | Admit: 2013-09-14 | Discharge: 2013-09-16 | DRG: 774 | Disposition: A | Discharge status: Home / Self Care | Payer: 59 | Source: Ambulatory Visit | Attending: Obstetrics and Gynecology | Admitting: Obstetrics and Gynecology

## 2013-09-14 ENCOUNTER — Encounter (HOSPITAL_COMMUNITY): Payer: 59 | Admitting: Anesthesiology

## 2013-09-14 VITALS — BP 122/77 | HR 87 | Temp 97.7°F | Resp 18 | Ht 69.0 in | Wt 253.0 lb

## 2013-09-14 DIAGNOSIS — Z6837 Body mass index (BMI) 37.0-37.9, adult: Secondary | ICD-10-CM

## 2013-09-14 DIAGNOSIS — O09529 Supervision of elderly multigravida, unspecified trimester: Secondary | ICD-10-CM | POA: Diagnosis present

## 2013-09-14 DIAGNOSIS — O139 Gestational [pregnancy-induced] hypertension without significant proteinuria, unspecified trimester: Principal | ICD-10-CM | POA: Diagnosis present

## 2013-09-14 DIAGNOSIS — Z833 Family history of diabetes mellitus: Secondary | ICD-10-CM

## 2013-09-14 DIAGNOSIS — A6000 Herpesviral infection of urogenital system, unspecified: Secondary | ICD-10-CM | POA: Diagnosis present

## 2013-09-14 DIAGNOSIS — O99214 Obesity complicating childbirth: Secondary | ICD-10-CM

## 2013-09-14 DIAGNOSIS — O98519 Other viral diseases complicating pregnancy, unspecified trimester: Secondary | ICD-10-CM | POA: Diagnosis present

## 2013-09-14 DIAGNOSIS — E669 Obesity, unspecified: Secondary | ICD-10-CM | POA: Diagnosis present

## 2013-09-14 DIAGNOSIS — O26879 Cervical shortening, unspecified trimester: Secondary | ICD-10-CM | POA: Diagnosis present

## 2013-09-14 DIAGNOSIS — Z8249 Family history of ischemic heart disease and other diseases of the circulatory system: Secondary | ICD-10-CM

## 2013-09-14 DIAGNOSIS — O99814 Abnormal glucose complicating childbirth: Secondary | ICD-10-CM | POA: Diagnosis present

## 2013-09-14 LAB — CBC
HEMATOCRIT: 37.8 % (ref 36.0–46.0)
HEMATOCRIT: 34.6 % — AB (ref 36.0–46.0)
HEMOGLOBIN: 11.8 g/dL — AB (ref 12.0–15.0)
Hemoglobin: 13.2 g/dL (ref 12.0–15.0)
MCH: 31.4 pg (ref 26.0–34.0)
MCH: 31 pg (ref 26.0–34.0)
MCHC: 34.9 g/dL (ref 30.0–36.0)
MCHC: 34.1 g/dL (ref 30.0–36.0)
MCV: 90 fL (ref 78.0–100.0)
MCV: 90.8 fL (ref 78.0–100.0)
PLATELETS: 245 10*3/uL (ref 150–400)
Platelets: 200 10*3/uL (ref 150–400)
RBC: 4.2 MIL/uL (ref 3.87–5.11)
RBC: 3.81 MIL/uL — ABNORMAL LOW (ref 3.87–5.11)
RDW: 14.2 % (ref 11.5–15.5)
RDW: 14.3 % (ref 11.5–15.5)
WBC: 8.2 10*3/uL (ref 4.0–10.5)
WBC: 17.8 10*3/uL — ABNORMAL HIGH (ref 4.0–10.5)

## 2013-09-14 LAB — URINE MICROSCOPIC-ADD ON

## 2013-09-14 LAB — COMPREHENSIVE METABOLIC PANEL
ALBUMIN: 2.4 g/dL — AB (ref 3.5–5.2)
ALT: 17 U/L (ref 0–35)
AST: 22 U/L (ref 0–37)
Alkaline Phosphatase: 152 U/L — ABNORMAL HIGH (ref 39–117)
BUN: 10 mg/dL (ref 6–23)
CALCIUM: 9.2 mg/dL (ref 8.4–10.5)
CO2: 21 mEq/L (ref 19–32)
CREATININE: 0.65 mg/dL (ref 0.50–1.10)
Chloride: 99 mEq/L (ref 96–112)
GFR calc Af Amer: >90 mL/min (ref >90)
Glucose, Bld: 94 mg/dL (ref 70–99)
Potassium: 4.2 mEq/L (ref 3.7–5.3)
Sodium: 134 mEq/L — ABNORMAL LOW (ref 137–147)
Total Bilirubin: 0.2 mg/dL — ABNORMAL LOW (ref 0.3–1.2)
Total Protein: 6.5 g/dL (ref 6.0–8.3)

## 2013-09-14 LAB — URINALYSIS, ROUTINE W REFLEX MICROSCOPIC
Bilirubin Urine: NEGATIVE
GLUCOSE, UA: NEGATIVE mg/dL
Ketones, ur: 15 mg/dL — AB
LEUKOCYTES UA: NEGATIVE
Nitrite: NEGATIVE
Protein, ur: NEGATIVE mg/dL
SPECIFIC GRAVITY, URINE: 1.015 (ref 1.005–1.030)
UROBILINOGEN UA: 0.2 mg/dL (ref 0.0–1.0)
pH: 5.5 (ref 5.0–8.0)

## 2013-09-14 LAB — URIC ACID: Uric Acid, Serum: 5.1 mg/dL (ref 2.4–7.0)

## 2013-09-14 MED ORDER — WITCH HAZEL-GLYCERIN EX PADS: 1.0000 "application " | MEDICATED_PAD | CUTANEOUS | Status: DC | PRN

## 2013-09-14 MED ORDER — DIPHENHYDRAMINE HCL 25 MG PO CAPS: 25.0000 mg | ORAL_CAPSULE | Freq: Four times a day (QID) | ORAL | Status: DC | PRN

## 2013-09-14 MED ORDER — TERBUTALINE SULFATE 1 MG/ML IJ SOLN: 0.2500 mg | Freq: Once | INTRAMUSCULAR | Status: AC | PRN

## 2013-09-14 MED ORDER — FENTANYL 2.5 MCG/ML BUPIVACAINE 1/10 % EPIDURAL INFUSION (WH - ANES)
INTRAMUSCULAR | Status: DC | PRN
  Administered 2013-09-14: 14 mL/h via EPIDURAL

## 2013-09-14 MED ORDER — ONDANSETRON HCL 4 MG/2ML IJ SOLN: 4.0000 mg | Freq: Four times a day (QID) | INTRAMUSCULAR | Status: DC | PRN

## 2013-09-14 MED ORDER — LANOLIN HYDROUS EX OINT: TOPICAL_OINTMENT | CUTANEOUS | Status: DC | PRN

## 2013-09-14 MED ORDER — CITRIC ACID-SODIUM CITRATE 334-500 MG/5ML PO SOLN: 30.0000 mL | ORAL | Status: DC | PRN

## 2013-09-14 MED ORDER — FENTANYL 2.5 MCG/ML BUPIVACAINE 1/10 % EPIDURAL INFUSION (WH - ANES)
14.0000 mL/h | INTRAMUSCULAR | Status: DC | PRN
  Filled 2013-09-14: qty 125

## 2013-09-14 MED ORDER — MISOPROSTOL 200 MCG PO TABS
ORAL_TABLET | ORAL | Status: AC
  Administered 2013-09-14: 800 ug via RECTAL
  Filled 2013-09-14: qty 4

## 2013-09-14 MED ORDER — PRENATAL MULTIVITAMIN CH
1.0000 | ORAL_TABLET | Freq: Every day | ORAL | Status: DC
  Administered 2013-09-15 – 2013-09-16 (×2): 1 via ORAL
  Filled 2013-09-14: qty 1

## 2013-09-14 MED ORDER — IBUPROFEN 600 MG PO TABS
600.0000 mg | ORAL_TABLET | Freq: Four times a day (QID) | ORAL | Status: DC
  Administered 2013-09-14 – 2013-09-16 (×8): 600 mg via ORAL
  Filled 2013-09-14 (×6): qty 1

## 2013-09-14 MED ORDER — TETANUS-DIPHTH-ACELL PERTUSSIS 5-2.5-18.5 LF-MCG/0.5 IM SUSP: 0.5000 mL | Freq: Once | INTRAMUSCULAR | Status: DC

## 2013-09-14 MED ORDER — ONDANSETRON HCL 4 MG PO TABS
4.0000 mg | ORAL_TABLET | ORAL | Status: DC | PRN
Start: 2013-09-14 — End: 2013-09-16

## 2013-09-14 MED ORDER — OXYTOCIN 40 UNITS IN LACTATED RINGERS INFUSION - SIMPLE MED
1.0000 m[IU]/min | INTRAVENOUS | Status: DC
  Administered 2013-09-14: 2 m[IU]/min via INTRAVENOUS
  Filled 2013-09-14: qty 1000

## 2013-09-14 MED ORDER — DIBUCAINE 1 % RE OINT: 1.0000 "application " | TOPICAL_OINTMENT | RECTAL | Status: DC | PRN

## 2013-09-14 MED ORDER — SENNOSIDES-DOCUSATE SODIUM 8.6-50 MG PO TABS
2.0000 | ORAL_TABLET | ORAL | Status: DC
  Administered 2013-09-14 – 2013-09-16 (×2): 2 via ORAL
  Filled 2013-09-14 (×2): qty 2

## 2013-09-14 MED ORDER — PHENYLEPHRINE 40 MCG/ML (10ML) SYRINGE FOR IV PUSH (FOR BLOOD PRESSURE SUPPORT)
80.0000 ug | PREFILLED_SYRINGE | INTRAVENOUS | Status: DC | PRN
  Filled 2013-09-14: qty 2

## 2013-09-14 MED ORDER — EPHEDRINE 5 MG/ML INJ
10.0000 mg | INTRAVENOUS | Status: DC | PRN
  Filled 2013-09-14: qty 2

## 2013-09-14 MED ORDER — EPHEDRINE 5 MG/ML INJ
10.0000 mg | INTRAVENOUS | Status: DC | PRN
  Filled 2013-09-14: qty 4
  Filled 2013-09-14: qty 2

## 2013-09-14 MED ORDER — OXYCODONE-ACETAMINOPHEN 5-325 MG PO TABS: 1.0000 | ORAL_TABLET | ORAL | Status: DC | PRN

## 2013-09-14 MED ORDER — LACTATED RINGERS IV SOLN
INTRAVENOUS | Status: DC
  Administered 2013-09-14: 08:00:00 via INTRAVENOUS

## 2013-09-14 MED ORDER — ZOLPIDEM TARTRATE 5 MG PO TABS: 5.0000 mg | ORAL_TABLET | Freq: Every evening | ORAL | Status: DC | PRN

## 2013-09-14 MED ORDER — BENZOCAINE-MENTHOL 20-0.5 % EX AERO
1.0000 "application " | INHALATION_SPRAY | CUTANEOUS | Status: DC | PRN
  Administered 2013-09-14 – 2013-09-16 (×2): 1 via TOPICAL
  Filled 2013-09-14 (×2): qty 56

## 2013-09-14 MED ORDER — OXYTOCIN BOLUS FROM INFUSION
500.0000 mL | INTRAVENOUS | Status: DC
  Administered 2013-09-14: 500 mL via INTRAVENOUS

## 2013-09-14 MED ORDER — OXYTOCIN 40 UNITS IN LACTATED RINGERS INFUSION - SIMPLE MED: 62.5000 mL/h | INTRAVENOUS | Status: DC

## 2013-09-14 MED ORDER — OXYCODONE-ACETAMINOPHEN 5-325 MG PO TABS
1.0000 | ORAL_TABLET | ORAL | Status: DC | PRN
Start: 2013-09-14 — End: 2013-09-14

## 2013-09-14 MED ORDER — ONDANSETRON HCL 4 MG/2ML IJ SOLN: 4.0000 mg | INTRAMUSCULAR | Status: DC | PRN

## 2013-09-14 MED ORDER — LACTATED RINGERS IV SOLN: 500.0000 mL | INTRAVENOUS | Status: DC | PRN

## 2013-09-14 MED ORDER — LIDOCAINE HCL (PF) 1 % IJ SOLN
30.0000 mL | INTRAMUSCULAR | Status: DC | PRN
  Filled 2013-09-14: qty 30

## 2013-09-14 MED ORDER — PHENYLEPHRINE 40 MCG/ML (10ML) SYRINGE FOR IV PUSH (FOR BLOOD PRESSURE SUPPORT)
80.0000 ug | PREFILLED_SYRINGE | INTRAVENOUS | Status: DC | PRN
Start: 2013-09-14 — End: 2013-09-16
  Filled 2013-09-14: qty 10
  Filled 2013-09-14: qty 2

## 2013-09-14 MED ORDER — BUTORPHANOL TARTRATE 1 MG/ML IJ SOLN: 1.0000 mg | INTRAMUSCULAR | Status: DC | PRN

## 2013-09-14 MED ORDER — LIDOCAINE HCL (PF) 1 % IJ SOLN
INTRAMUSCULAR | Status: DC | PRN
  Administered 2013-09-14 (×2): 5 mL

## 2013-09-14 MED ORDER — ACETAMINOPHEN 325 MG PO TABS: 650.0000 mg | ORAL_TABLET | ORAL | Status: DC | PRN

## 2013-09-14 MED ORDER — IBUPROFEN 600 MG PO TABS
600.0000 mg | ORAL_TABLET | Freq: Four times a day (QID) | ORAL | Status: DC | PRN
  Filled 2013-09-14: qty 1

## 2013-09-14 MED ORDER — DIPHENHYDRAMINE HCL 50 MG/ML IJ SOLN: 12.5000 mg | INTRAMUSCULAR | Status: DC | PRN

## 2013-09-14 MED ORDER — SIMETHICONE 80 MG PO CHEW: 80.0000 mg | CHEWABLE_TABLET | ORAL | Status: DC | PRN

## 2013-09-14 MED ORDER — LACTATED RINGERS IV SOLN
500.0000 mL | Freq: Once | INTRAVENOUS | Status: AC
  Administered 2013-09-14: 500 mL via INTRAVENOUS

## 2013-09-14 NOTE — H&P (Signed)
Yvonne Fisher is a 38 y.o. female [phone] presenting for IOL at term given increasing BP over several weeks.  Pt was noted to have BP of 140/90-160/90 over the last 2 weeks.  [facility] labs have remained WNL. She is asymptomatic.  Urine did have trace to 1+ proten, but also had a lot of blood and leukocytes, and she was treated for a UTI.  NST's have remained reassuring.  Her prenatal care is otherwise significant for GDM that has been well-controlled on diet.  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  She had cervical shortening at 30 weeks so was managed with vaginal progesterone, rest, and received steroids. SHe had an ASCUS pap with HPV positive, but her colpo was wnl and she will repat pp. Maternal Medical History:  Contractions: Frequency: irregular.   Perceived severity is mild.    Fetal activity: Perceived fetal activity is normal.    Prenatal complications: PIH and preterm labor.   Prenatal Complications - Diabetes: gestational.    OB History   Grav Para Term Preterm Abortions TAB SAB Ect Mult Living   4 1 1  0 2 0 2 0 0 1    SAB x 2 NSVD 6#13 2012  Past Medical History  Diagnosis Date  . Arthritis     right elbow   . Hidradenitis 11/2011    left axilla  . Rash 12/01/2011    bilat. axilla  . Cough 12/01/2011    prod. of yellow sputum  . Gestational diabetes mellitus, antepartum   . Gestational diabetes     diet controlled   Past Surgical History  Procedure Laterality Date  . Elbow arthroscopy  1997    right elbow  . Cholecystectomy  04/15/2011    Procedure: LAPAROSCOPIC CHOLECYSTECTOMY WITH INTRAOPERATIVE CHOLANGIOGRAM;  Surgeon: Adin Hector, MD;  Location: [facility];  Service: General;  Laterality: N/A;  laparoscopic cholecystectomy with cholangiogram  . Hydradenitis excision  12/05/2011    Procedure: EXCISION HYDRADENITIS AXILLA;  Surgeon: Harl Bowie, MD;  Location: [facility];  Service: General;  Laterality: Left;  wide excision  hidradenitis left axilla   Family History: family history includes Diabetes in her father; Heart disease in her father; Hypertension in her mother; Multiple sclerosis in her mother. Social History:  reports that she has never smoked. She has never used smokeless tobacco. She reports that she drinks alcohol. She reports that she does not use illicit drugs.   Prenatal Transfer Tool  Maternal Diabetes: Yes:  Diabetes Type:  Diet controlled Genetic Screening: Normal Maternal Ultrasounds/Referrals: Normal Fetal Ultrasounds or other Referrals:  None Maternal Substance Abuse:  No Significant Maternal Medications:  Meds include: Other: Valtrex Significant Maternal Lab Results:  Lab values include: Other: h/o HSV Other Comments:  None  Review of Systems  Eyes: Negative for blurred vision.  Neurological: Negative for headaches.      Last menstrual period 12/09/2012. Maternal Exam:  Uterine Assessment: Contraction strength is mild.  Contraction frequency is irregular.   Abdomen: Patient reports no abdominal tenderness. Fetal presentation: vertex  Introitus: Normal vulva. Normal vagina.    Physical Exam  Constitutional: She is oriented to person, place, and time. She appears well-developed and well-nourished.  Cardiovascular: Normal rate and regular rhythm.   Respiratory: Effort normal.  GI: Soft.  Genitourinary: Vagina normal.  Gravid  Neurological: She is alert and oriented to person, place, and time.  Psychiatric: She has a normal mood and affect.    Prenatal  labs: ABO, Rh: A/Positive/-- (10/27 0000) Antibody: Negative (10/27 0000) Rubella: Immune (10/27 0000) RPR: Nonreactive (10/27 0000)  HBsAg: Negative (10/27 0000)  HIV: Non-reactive (10/27 0000)  GBS: Negative (04/23 0000)  Panorama negative CF Negative One hour GTT 188 Three hour 92/185/173/150  Assessment/Plan: Pt is admitted for IOL with PIH.  Plan pitocin and AROM.  WIll repeat [facility] labs to assure WNL.  Also  repeat UA (catheterized)   [name] 09/14/2013, 6:19 AM

## 2013-09-14 NOTE — Anesthesia Preprocedure Evaluation (Signed)
Anesthesia Evaluation  Patient identified by MRN, date of birth, ID band Patient awake    Reviewed: Allergy & Precautions, H&P , Patient's Chart, lab work & pertinent test results  Airway Mallampati: III TM Distance: >3 FB Neck ROM: full    Dental   Pulmonary  breath sounds clear to auscultation        Cardiovascular hypertension, Rhythm:regular Rate:Normal     Neuro/Psych    GI/Hepatic   Endo/Other  diabetesMorbid obesity  Renal/GU      Musculoskeletal   Abdominal   Peds  Hematology   Anesthesia Other Findings   Reproductive/Obstetrics (+) Pregnancy                           Anesthesia Physical Anesthesia Plan  ASA: III  Anesthesia Plan: Epidural   Post-op Pain Management:    Induction:   Airway Management Planned:   Additional Equipment:   Intra-op Plan:   Post-operative Plan:   Informed Consent: I have reviewed the patients History and Physical, chart, labs and discussed the procedure including the risks, benefits and alternatives for the proposed anesthesia with the patient or authorized representative who has indicated his/her understanding and acceptance.     Plan Discussed with:   Anesthesia Plan Comments:         Anesthesia Quick Evaluation

## 2013-09-14 NOTE — Lactation Note (Signed)
This note was copied from the chart of [place]. Lactation Consultation Note  Patient Name: Yvonne Fisher [id number] Date: 09/14/2013 Reason for consult: Initial assessment  Mom requested assistance with latching baby.  Baby showing subtle cues while resting in crib.  Mom had just "passed out" in bathroom.  Basics reviewed with Mom.  Assisted with football hold.  Undressed baby to facilitate skin to skin while breast feeding.  Manual breast expression demonstrated, small drop of colostrum noted.  Baby did not latch, showed no interest at this point.  Reassured Mom that this was normal at this stage in baby's life.  I encouraged her to keep baby skin to skin on Mom's chest.   Mom has history of low milk supply with her first child (8 years ago).  She reports a lack of support in the hospital, and she quit trying once she got home.  Mom reports no breast changes in early pregnancy.  Breasts are large and heavy.  Reassured Mom that she will get help while in hospital and after discharge as she needs and wants.  Shared with her IP and OP Lactation support offered.  Encouraged as much skin to skin, and cue based feeding.  To follow up tomorrow, and prn prior to that as needed.   Consult Status Consult Status: Follow-up Date: 09/15/13 Follow-up type: In-patient    Broadus John 09/14/2013, 4:12 PM

## 2013-09-14 NOTE — Plan of Care (Signed)
Problem: Consults Goal: Birthing Suites Patient Information Press F2 to bring up selections list Outcome: Completed/Met Date Met:  09/14/13  Pt 37-[redacted] weeks EGA

## 2013-09-14 NOTE — Anesthesia Procedure Notes (Addendum)
Epidural Patient location during procedure: OB Start time: 09/14/2013 10:16 AM  Staffing Anesthesiologist: Rudean Curt Performed by: anesthesiologist   Preanesthetic Checklist Completed: patient identified, site marked, surgical consent, pre-op evaluation, timeout performed, IV checked, risks and benefits discussed and monitors and equipment checked  Epidural Patient position: sitting Prep: site prepped and draped and DuraPrep Patient monitoring: continuous pulse ox and blood pressure Approach: midline Location: L3-L4 Injection technique: LOR air  Needle:  Needle type: Tuohy  Needle gauge: 17 G Needle length: 9 cm and 9 Needle insertion depth: 7 cm Catheter type: closed end flexible Catheter size: 19 Gauge Catheter at skin depth: 12 cm Test dose: negative  Assessment Events: blood not aspirated, injection not painful, no injection resistance, negative IV test and no paresthesia  Additional Notes Patient identified.  Risk benefits discussed including failed block, incomplete pain control, headache, nerve damage, paralysis, blood pressure changes, nausea, vomiting, reactions to medication both toxic or allergic, and postpartum back pain.  Patient expressed understanding and wished to proceed.  All questions were answered.  Sterile technique used throughout procedure and epidural site dressed with sterile barrier dressing. No paresthesia or other complications noted.The patient did not experience any signs of intravascular injection such as tinnitus or metallic taste in mouth nor signs of intrathecal spread such as rapid motor block. Please see nursing notes for vital signs.

## 2013-09-14 NOTE — Progress Notes (Signed)
Patient ID: Yvonne Fisher, female   DOB: 1976-03-29, 38 y.o.   MRN: [id number] Pt feeling minimal contractions FHR category 1 Cervix 50/4-5/-2 AROM clear BP elevated to 150/90's, but no PIH sx. CBC WNL, awaiting other labs Will check urine for protein after epidural.

## 2013-09-15 LAB — CBC
HCT: 27.3 % — ABNORMAL LOW (ref 36.0–46.0)
HEMOGLOBIN: 9.4 g/dL — AB (ref 12.0–15.0)
MCH: 31.6 pg (ref 26.0–34.0)
MCHC: 34.4 g/dL (ref 30.0–36.0)
MCV: 91.9 fL (ref 78.0–100.0)
Platelets: 168 10*3/uL (ref 150–400)
RBC: 2.97 MIL/uL — AB (ref 3.87–5.11)
RDW: 14.6 % (ref 11.5–15.5)
WBC: 12.7 10*3/uL — ABNORMAL HIGH (ref 4.0–10.5)

## 2013-09-15 LAB — COMPREHENSIVE METABOLIC PANEL
ALT: 15 U/L (ref 0–35)
AST: 20 U/L (ref 0–37)
Albumin: 1.7 g/dL — ABNORMAL LOW (ref 3.5–5.2)
Alkaline Phosphatase: 106 U/L (ref 39–117)
BUN: 10 mg/dL (ref 6–23)
CALCIUM: 8.4 mg/dL (ref 8.4–10.5)
CO2: 24 meq/L (ref 19–32)
CREATININE: 0.76 mg/dL (ref 0.50–1.10)
Chloride: 100 mEq/L (ref 96–112)
GLUCOSE: 90 mg/dL (ref 70–99)
Potassium: 4.4 mEq/L (ref 3.7–5.3)
SODIUM: 134 meq/L — AB (ref 137–147)
Total Bilirubin: <0.2 mg/dL — ABNORMAL LOW (ref 0.3–1.2)
Total Protein: 4.7 g/dL — ABNORMAL LOW (ref 6.0–8.3)

## 2013-09-15 LAB — RPR

## 2013-09-15 NOTE — Discharge Summary (Signed)
Obstetric Discharge Summary Reason for Admission: induction of labor Prenatal Procedures: NST Intrapartum Procedures: spontaneous vaginal delivery Postpartum Procedures: mild postpartum hemorrhage Complications-Operative and Postpartum: first degree perineal laceration and right labial laceration Hemoglobin  Date Value Ref Range Status  09/15/2013 9.4* 12.0 - 15.0 g/dL Final     DELTA CHECK NOTED     REPEATED TO VERIFY  09/25/2011 13.3  12.2 - 16.2 g/dL Corrected     HCT  Date Value Ref Range Status  09/15/2013 27.3* 36.0 - 46.0 % Final     HCT, POC  Date Value Ref Range Status  09/25/2011 39.8  37.7 - 47.9 % Corrected    Physical Exam:  General: alert and cooperative Lochia: appropriate Uterine Fundus: firm   Discharge Diagnoses: Term Pregnancy-delivered                                         Gestational Hypertension                                         Gestational Diabetes-diet controlled  Discharge Information: Date: 09/15/2013 Activity: pelvic rest Diet: routine Medications: Ibuprofen Condition: improved Instructions: refer to practice specific booklet Discharge to: home Follow-up Information   Follow up with Yvonne Bores, MD In 6 weeks. (postpartum)    Specialty:  Obstetrics and Gynecology   Contact information:   [address] [address] [phone]       Newborn Data: Live born female  Birth Weight: 6 lb 13.6 oz (3107 g) APGAR: 8, 9  Home with mother.  Yvonne Fisher 09/15/2013, 5:03 PM

## 2013-09-15 NOTE — Lactation Note (Signed)
This note was copied from the chart of [place]. Lactation Consultation Note 6 breastfeeds, 5 voids, 3 stools in the last 24 hours.  Mother able to easily express colostrum. Attempted to latch baby in fb hold but baby sleepy.  Encouraged mother to eat her dinner and try again after dinner.  Reviewed waking techniques and cluster feeding.   Mom encouraged to feed baby 8-12 times/24 hours and with feeding cues for longer than 10 minutes.  Suggest she call if she needs further assistance.      Patient Name: Yvonne Fisher WIOMB'T Date: 09/15/2013 Reason for consult: Follow-up assessment   Maternal Data    Feeding Feeding Type: Breast Fed Length of feed: 10 min  LATCH Score/Interventions Latch: Grasps breast easily, tongue down, lips flanged, rhythmical sucking. Intervention(s): Skin to skin;Teach feeding cues;Waking techniques Intervention(s): Assist with latch;Breast compression  Audible Swallowing: None Intervention(s): Skin to skin;Hand expression  Type of Nipple: Everted at rest and after stimulation  Comfort (Breast/Nipple): Soft / non-tender     Hold (Positioning): No assistance needed to correctly position infant at breast. Intervention(s): Breastfeeding basics reviewed;Support Pillows;Position options;Skin to skin  LATCH Score: 8  Lactation Tools Discussed/Used     Consult Status Consult Status: Follow-up Date: 09/16/13 Follow-up type: In-patient    Larry Sierras Lin Glazier 09/15/2013, 6:30 PM

## 2013-09-15 NOTE — Anesthesia Postprocedure Evaluation (Signed)
Anesthesia Post Note  Patient: Yvonne Fisher  Procedure(s) Performed: * No procedures listed *  Anesthesia type: Epidural  Patient location: Mother/Baby  Post pain: Pain level controlled  Post assessment: Post-op Vital signs reviewed  Last Vitals:  Filed Vitals:   09/15/13 0515  BP: 107/73  Pulse: 94  Temp: 36.6 C  Resp: 16    Post vital signs: Reviewed  Level of consciousness:alert  Complications: No apparent anesthesia complications

## 2013-09-15 NOTE — Progress Notes (Signed)
Post Partum Day 1 Subjective: no complaints, up ad lib and tolerating PO  Objective: Blood pressure 107/73, pulse 94, temperature 97.9 F (36.6 C), temperature source Oral, resp. rate 16, height 5\' 9"  (1.753 m), weight 114.76 kg (253 lb), last menstrual period 12/09/2012, SpO2 100.00%, unknown if currently breastfeeding.  Physical Exam:  General: alert and cooperative Lochia: appropriate Uterine Fundus: firm    Recent Labs  09/14/13 1258 09/15/13 0625  HGB 11.8* 9.4*  HCT 34.6* 27.3*    Assessment/Plan: Plan for discharge tomorrow   LOS: 1 day   Yvonne Fisher 09/15/2013, 12:38 PM

## 2013-09-16 MED ORDER — IBUPROFEN 600 MG PO TABS: 600.0000 mg | ORAL_TABLET | Freq: Four times a day (QID) | ORAL | Status: DC | PRN

## 2013-09-16 NOTE — Progress Notes (Signed)
Post Partum Day 1 Subjective: no complaints and tolerating PO  Objective: Blood pressure 122/77, pulse 87, temperature 97.7 F (36.5 C), temperature source Oral, resp. rate 18, height 5\' 9"  (1.753 m), weight 114.76 kg (253 lb), last menstrual period 12/09/2012, SpO2 100.00%, unknown if currently breastfeeding.  Physical Exam:  General: alert and cooperative Lochia: appropriate Uterine Fundus: firm    Recent Labs  09/14/13 1258 09/15/13 0625  HGB 11.8* 9.4*  HCT 34.6* 27.3*    Assessment/Plan: Discharge home   LOS: 2 days   [name] 09/16/2013, 7:20 AM

## 2013-11-02 ENCOUNTER — Encounter: Payer: Self-pay | Admitting: Family Medicine

## 2013-11-02 ENCOUNTER — Ambulatory Visit (INDEPENDENT_AMBULATORY_CARE_PROVIDER_SITE_OTHER): Payer: 59 | Admitting: Family Medicine

## 2013-11-02 VITALS — BP 140/81 | HR 81 | Temp 98.6°F | Resp 18 | Ht 68.0 in | Wt 231.4 lb

## 2013-11-02 DIAGNOSIS — E669 Obesity, unspecified: Secondary | ICD-10-CM

## 2013-11-02 DIAGNOSIS — E119 Type 2 diabetes mellitus without complications: Secondary | ICD-10-CM

## 2013-11-02 DIAGNOSIS — IMO0001 Reserved for inherently not codable concepts without codable children: Secondary | ICD-10-CM

## 2013-11-02 DIAGNOSIS — E010 Iodine-deficiency related diffuse (endemic) goiter: Secondary | ICD-10-CM

## 2013-11-02 DIAGNOSIS — R030 Elevated blood-pressure reading, without diagnosis of hypertension: Secondary | ICD-10-CM

## 2013-11-02 DIAGNOSIS — E049 Nontoxic goiter, unspecified: Secondary | ICD-10-CM

## 2013-11-02 LAB — CBC WITH DIFFERENTIAL/PLATELET
BASOS ABS: 0.1 10*3/uL (ref 0.0–0.1)
BASOS PCT: 1 % (ref 0–1)
EOS PCT: 2 % (ref 0–5)
Eosinophils Absolute: 0.1 10*3/uL (ref 0.0–0.7)
HEMATOCRIT: 36.8 % (ref 36.0–46.0)
Hemoglobin: 12.5 g/dL (ref 12.0–15.0)
Lymphocytes Relative: 42 % (ref 12–46)
Lymphs Abs: 2.4 10*3/uL (ref 0.7–4.0)
MCH: 28.9 pg (ref 26.0–34.0)
MCHC: 34 g/dL (ref 30.0–36.0)
MCV: 85 fL (ref 78.0–100.0)
MONO ABS: 0.3 10*3/uL (ref 0.1–1.0)
Monocytes Relative: 6 % (ref 3–12)
NEUTROS ABS: 2.8 10*3/uL (ref 1.7–7.7)
Neutrophils Relative %: 49 % (ref 43–77)
Platelets: 374 10*3/uL (ref 150–400)
RBC: 4.33 MIL/uL (ref 3.87–5.11)
RDW: 14.2 % (ref 11.5–15.5)
WBC: 5.8 10*3/uL (ref 4.0–10.5)

## 2013-11-02 LAB — POCT GLYCOSYLATED HEMOGLOBIN (HGB A1C): Hemoglobin A1C: 5.9

## 2013-11-02 LAB — GLUCOSE, POCT (MANUAL RESULT ENTRY): POC GLUCOSE: 126 mg/dL — AB (ref 70–99)

## 2013-11-02 NOTE — Progress Notes (Signed)
Subjective:    Patient ID: Yvonne Fisher, female    DOB: [birth year], 38 y.o.   MRN: [id number]  11/02/2013  Establish Care and Diabetes   HPI This 38 y.o. female presents to establish care.  1.  Hyperglycemia:  S/p 2 hour test last week at gynecology at six week post-partum visit and result was 206.  S/p nutritional classes during pregnancy; +gestational diabetes.   B: cereal or toast with butter; [place] bacon, eggs.  Water and milk.  Rare juice. Snack:  Celery and carrots, water; ranch or PB. Lunch:  PB sandwich, water Snack:  Cereal, hamburger no bread. Supper: sloppy joes, corn, green beans.  Water Snack:  Cereal. Protein bars.    Walking some; no formal exercise.  About to start back work; walks steps. Getting back into [employer].  2. Blood pressure elevated: no previous hx of elevated blood pressure; BP readings during pregnancy were good.  Review of Systems  Constitutional: Negative for fever, chills, diaphoresis and fatigue.  Eyes: Negative for visual disturbance.  Respiratory: Negative for cough, shortness of breath and stridor.   Cardiovascular: Negative for chest pain, palpitations and leg swelling.  Gastrointestinal: Negative for nausea, vomiting, abdominal pain and diarrhea.  Endocrine: Negative for cold intolerance, heat intolerance, polydipsia, polyphagia and polyuria.  Skin: Negative for color change, pallor, rash and wound.  Neurological: Negative for dizziness, tremors, seizures, syncope, facial asymmetry, speech difficulty, weakness, light-headedness, numbness and headaches.    Past Medical History  Diagnosis Date  . Arthritis     right elbow   . Hidradenitis 11/2011    left axilla  . Rash 12/01/2011    bilat. axilla  . Gestational diabetes mellitus, antepartum   . Gestational diabetes     diet controlled   Past Surgical History  Procedure Laterality Date  . Elbow arthroscopy  1997    right elbow  . Cholecystectomy  04/15/2011    Procedure:  LAPAROSCOPIC CHOLECYSTECTOMY WITH INTRAOPERATIVE CHOLANGIOGRAM;  Surgeon: Adin Hector, MD;  Location: [facility];  Service: General;  Laterality: N/A;  laparoscopic cholecystectomy with cholangiogram  . Hydradenitis excision  12/05/2011    Procedure: EXCISION HYDRADENITIS AXILLA;  Surgeon: Harl Bowie, MD;  Location: [facility];  Service: General;  Laterality: Left;  wide excision hidradenitis left axilla    Allergies  Allergen Reactions  . Adhesive [Tape] Other (See Comments)    TEARS SKIN  . Prevacid [Lansoprazole] Hives   No current outpatient prescriptions on file.   No current facility-administered medications for this visit.   History   Social History  . Marital Status: Single    Spouse Name: N/A    Number of Children: N/A  . Years of Education: N/A   Occupational History  . Not on file.   Social History Main Topics  . Smoking status: Never Smoker   . Smokeless tobacco: Never Used  . Alcohol Use: Yes     Comment: occasionally  . Drug Use: No  . Sexual Activity: Yes    Birth Control/ Protection: None   Other Topics Concern  . Not on file   Social History Narrative   Marital status: single; dating seriously x 10 years; happy; no abuse.      Children:  (105 yo son, 28 week old boy).      Lives: with 2 sons, boyfriend.      Employment:  [employer] x 15 years      Tobacco: none  Alcohol: none      Drug: none   Family History  Problem Relation Age of Onset  . Hypertension Mother   . Multiple sclerosis Mother   . Diabetes Father   . Heart disease Father 43    AMI       Objective:    BP 140/81  Pulse 81  Temp(Src) 98.6 F (37 C) (Oral)  Resp 18  Ht _0  (1.727 m)  Wt 231 lb 6.4 oz (104.962 kg)  BMI 35.19 kg/m2  SpO2 99%  LMP 11/02/2013 Physical Exam  Constitutional: She is oriented to person, place, and time. She appears well-developed and well-nourished. No distress.  HENT:  Head: Normocephalic and  atraumatic.  Right Ear: External ear normal.  Left Ear: External ear normal.  Nose: Nose normal.  Mouth/Throat: Oropharynx is clear and moist.  Eyes: Conjunctivae and EOM are normal. Pupils are equal, round, and reactive to light.  Neck: Normal range of motion. Neck supple. Carotid bruit is not present. Thyromegaly present.  Cardiovascular: Normal rate, regular rhythm, normal heart sounds and intact distal pulses.  Exam reveals no gallop and no friction rub.   No murmur heard. Pulmonary/Chest: Effort normal and breath sounds normal. She has no wheezes. She has no rales.  Abdominal: Soft. Bowel sounds are normal. She exhibits no distension and no mass. There is no tenderness. There is no rebound and no guarding.  Lymphadenopathy:    She has no cervical adenopathy.  Neurological: She is alert and oriented to person, place, and time. No cranial nerve deficit.  Skin: Skin is warm and dry. No rash noted. She is not diaphoretic. No erythema. No pallor.  Psychiatric: She has a normal mood and affect. Her behavior is normal.   Results for orders placed in visit on 11/02/13  CBC WITH DIFFERENTIAL      Result Value Ref Range   WBC 5.8  4.0 - 10.5 K/uL   RBC 4.33  3.87 - 5.11 MIL/uL   Hemoglobin 12.5  12.0 - 15.0 g/dL   HCT 36.8  36.0 - 46.0 %   MCV 85.0  78.0 - 100.0 fL   MCH 28.9  26.0 - 34.0 pg   MCHC 34.0  30.0 - 36.0 g/dL   RDW 14.2  11.5 - 15.5 %   Platelets 374  150 - 400 K/uL   Neutrophils Relative % 49  43 - 77 %   Neutro Abs 2.8  1.7 - 7.7 K/uL   Lymphocytes Relative 42  12 - 46 %   Lymphs Abs 2.4  0.7 - 4.0 K/uL   Monocytes Relative 6  3 - 12 %   Monocytes Absolute 0.3  0.1 - 1.0 K/uL   Eosinophils Relative 2  0 - 5 %   Eosinophils Absolute 0.1  0.0 - 0.7 K/uL   Basophils Relative 1  0 - 1 %   Basophils Absolute 0.1  0.0 - 0.1 K/uL   Smear Review Criteria for review not met    COMPREHENSIVE METABOLIC PANEL      Result Value Ref Range   Sodium 139  135 - 145 mEq/L    Potassium 4.2  3.5 - 5.3 mEq/L   Chloride 104  96 - 112 mEq/L   CO2 26  19 - 32 mEq/L   Glucose, Bld 128 (*) 70 - 99 mg/dL   BUN 10  6 - 23 mg/dL   Creat 0.82  0.50 - 1.10 mg/dL   Total Bilirubin 0.4  0.2 -  1.2 mg/dL   Alkaline Phosphatase 92  39 - 117 U/L   AST 18  0 - 37 U/L   ALT 21  0 - 35 U/L   Total Protein 7.0  6.0 - 8.3 g/dL   Albumin 4.0  3.5 - 5.2 g/dL   Calcium 9.2  8.4 - 10.5 mg/dL  TSH      Result Value Ref Range   TSH 0.564  0.350 - 4.500 uIU/mL  T4, FREE      Result Value Ref Range   Free T4 0.91  0.80 - 1.80 ng/dL  LIPID PANEL      Result Value Ref Range   Cholesterol 237 (*) 0 - 200 mg/dL   Triglycerides 94  <150 mg/dL   HDL 70  >39 mg/dL   Total CHOL/HDL Ratio 3.4     VLDL 19  0 - 40 mg/dL   LDL Cholesterol 148 (*) 0 - 99 mg/dL  GLUCOSE, POCT (MANUAL RESULT ENTRY)      Result Value Ref Range   POC Glucose 126 (*) 70 - 99 mg/dl  POCT GLYCOSYLATED HEMOGLOBIN (HGB A1C)      Result Value Ref Range   Hemoglobin A1C 5.9         Assessment & Plan:  Type II or unspecified type diabetes mellitus without mention of complication, not stated as uncontrolled - Plan: POCT glucose (manual entry), POCT glycosylated hemoglobin (Hb A1C), CBC with Differential, Comprehensive metabolic panel, Lipid panel  Thyromegaly - Plan: US Soft Tissue Head/Neck, TSH, T4, free  Obesity, unspecified  Blood pressure elevated  1. DMII: new to this provider; +gestational diabetes; recommend further weight loss and exercise and low-sugar low-carbohydrate food choices.  RTC three months for reevaluation.  S/p diabetic nutritional consultation during recent pregnancy. 2. Thyromegaly: New. Obtain labs; refer for thyroid ultrasound. 3.  Obesity: New; recommend weight loss, exercise, low-fat food choices. 4. Blood pressure elevated:  New.  Recommend weight loss, exercise, low-sodium food choices.  No orders of the defined types were placed in this encounter.    Return in about 3 months  (around 02/02/2014) for recheck diabetes.   Reginia Forts, M.D.  [facility] [address] [place], [place]  [address] [phone] phone [phone] fax

## 2013-11-02 NOTE — Patient Instructions (Signed)
1. Check sugars twice each week (check fasting first thing in the morning; also check 2 hours after largest meal of the day). 2.  Keep record of sugars.

## 2013-11-03 LAB — COMPREHENSIVE METABOLIC PANEL
ALK PHOS: 92 U/L (ref 39–117)
ALT: 21 U/L (ref 0–35)
AST: 18 U/L (ref 0–37)
Albumin: 4 g/dL (ref 3.5–5.2)
BUN: 10 mg/dL (ref 6–23)
CALCIUM: 9.2 mg/dL (ref 8.4–10.5)
CHLORIDE: 104 meq/L (ref 96–112)
CO2: 26 mEq/L (ref 19–32)
CREATININE: 0.82 mg/dL (ref 0.50–1.10)
Glucose, Bld: 128 mg/dL — ABNORMAL HIGH (ref 70–99)
POTASSIUM: 4.2 meq/L (ref 3.5–5.3)
Sodium: 139 mEq/L (ref 135–145)
Total Bilirubin: 0.4 mg/dL (ref 0.2–1.2)
Total Protein: 7 g/dL (ref 6.0–8.3)

## 2013-11-03 LAB — LIPID PANEL
Cholesterol: 237 mg/dL — ABNORMAL HIGH (ref 0–200)
HDL: 70 mg/dL (ref >39)
LDL CALC: 148 mg/dL — AB (ref 0–99)
Total CHOL/HDL Ratio: 3.4 Ratio
Triglycerides: 94 mg/dL (ref <150)
VLDL: 19 mg/dL (ref 0–40)

## 2013-11-03 LAB — TSH: TSH: 0.564 u[IU]/mL (ref 0.350–4.500)

## 2013-11-03 LAB — T4, FREE: Free T4: 0.91 ng/dL (ref 0.80–1.80)

## 2013-11-04 ENCOUNTER — Ambulatory Visit
Admission: RE | Admit: 2013-11-04 | Discharge: 2013-11-04 | Disposition: A | Discharge status: Home / Self Care | Payer: 59 | Source: Ambulatory Visit | Attending: Family Medicine | Admitting: Family Medicine

## 2013-11-04 DIAGNOSIS — E010 Iodine-deficiency related diffuse (endemic) goiter: Secondary | ICD-10-CM

## 2013-11-09 ENCOUNTER — Other Ambulatory Visit: Payer: Self-pay | Admitting: Radiology

## 2013-11-09 DIAGNOSIS — E041 Nontoxic single thyroid nodule: Secondary | ICD-10-CM

## 2013-11-15 ENCOUNTER — Ambulatory Visit
Admission: RE | Admit: 2013-11-15 | Discharge: 2013-11-15 | Disposition: A | Discharge status: Home / Self Care | Payer: 59 | Source: Ambulatory Visit | Attending: Family Medicine | Admitting: Family Medicine

## 2013-11-15 ENCOUNTER — Other Ambulatory Visit (HOSPITAL_COMMUNITY)
Admission: RE | Admit: 2013-11-15 | Discharge: 2013-11-15 | Disposition: A | Discharge status: Home / Self Care | Payer: 59 | Source: Ambulatory Visit | Attending: Interventional Radiology | Admitting: Interventional Radiology

## 2013-11-15 DIAGNOSIS — E041 Nontoxic single thyroid nodule: Secondary | ICD-10-CM

## 2013-11-23 ENCOUNTER — Other Ambulatory Visit: Payer: Self-pay | Admitting: Radiology

## 2013-11-23 DIAGNOSIS — E041 Nontoxic single thyroid nodule: Secondary | ICD-10-CM

## 2013-11-28 ENCOUNTER — Other Ambulatory Visit: Payer: Self-pay | Admitting: Obstetrics and Gynecology

## 2013-12-12 ENCOUNTER — Ambulatory Visit (INDEPENDENT_AMBULATORY_CARE_PROVIDER_SITE_OTHER): Payer: 59 | Admitting: Surgery

## 2013-12-12 ENCOUNTER — Encounter (INDEPENDENT_AMBULATORY_CARE_PROVIDER_SITE_OTHER): Payer: Self-pay | Admitting: Surgery

## 2013-12-12 VITALS — BP 160/100 | HR 98 | Temp 97.6°F | Ht 68.0 in | Wt 237.5 lb

## 2013-12-12 DIAGNOSIS — E041 Nontoxic single thyroid nodule: Secondary | ICD-10-CM

## 2013-12-12 NOTE — Progress Notes (Signed)
Patient ID: Yvonne Fisher, female   DOB: [birth year], 38 y.o.   MRN: [id number]  Chief Complaint  Patient presents with  . thyroid eval    HPI Yvonne Fisher is a 38 y.o. female.   HPI This is a pleasant patient of mine who had operated on for hidradenitis. She now comes in with a new diagnosis of a thyroid nodule. She was recently evaluated by her primary care physician who noticed an enlarged thyroid gland with nodules prompting an ultrasound and a biopsy. Currently she is without complaints. She has no difficulty swallowing. She has no symptoms of hyper or hypothyroidism. She is well healed from her head at night of surgery. Past Medical History  Diagnosis Date  . Arthritis     right elbow   . Hidradenitis 11/2011    left axilla  . Rash 12/01/2011    bilat. axilla  . Gestational diabetes mellitus, antepartum   . Gestational diabetes     diet controlled    Past Surgical History  Procedure Laterality Date  . Elbow arthroscopy  1997    right elbow  . Cholecystectomy  04/15/2011    Procedure: LAPAROSCOPIC CHOLECYSTECTOMY WITH INTRAOPERATIVE CHOLANGIOGRAM;  Surgeon: Adin Hector, MD;  Location: [facility];  Service: General;  Laterality: N/A;  laparoscopic cholecystectomy with cholangiogram  . Hydradenitis excision  12/05/2011    Procedure: EXCISION HYDRADENITIS AXILLA;  Surgeon: Harl Bowie, MD;  Location: [place];  Service: General;  Laterality: Left;  wide excision hidradenitis left axilla    Family History  Problem Relation Age of Onset  . Hypertension Mother   . Multiple sclerosis Mother   . Diabetes Father   . Heart disease Father 33    [facility]    Social History History  Substance Use Topics  . Smoking status: Never Smoker   . Smokeless tobacco: Never Used  . Alcohol Use: Yes     Comment: occasionally    Allergies  Allergen Reactions  . Adhesive [Tape] Other (See Comments)    TEARS SKIN  . Prevacid [Lansoprazole] Hives    No current  outpatient prescriptions on file.   No current facility-administered medications for this visit.    Review of Systems Review of Systems  Constitutional: Negative for fever, chills and unexpected weight change.  HENT: Negative for congestion, hearing loss, sore throat, trouble swallowing and voice change.   Eyes: Negative for visual disturbance.  Respiratory: Negative for cough and wheezing.   Cardiovascular: Negative for chest pain, palpitations and leg swelling.  Gastrointestinal: Negative for nausea, vomiting, abdominal pain, diarrhea, constipation, blood in stool, abdominal distention and anal bleeding.  Genitourinary: Negative for hematuria, vaginal bleeding and difficulty urinating.  Musculoskeletal: Negative for arthralgias.  Skin: Negative for rash and wound.  Neurological: Negative for seizures, syncope and headaches.  Hematological: Negative for adenopathy. Does not bruise/bleed easily.  Psychiatric/Behavioral: Negative for confusion.    Blood pressure 160/100, pulse 98, temperature 97.6 F (36.4 C), temperature source Temporal, height 5\' 8"  (1.727 m), weight 237 lb 8 oz (107.729 kg), unknown if currently breastfeeding.  Physical Exam Physical Exam  Constitutional: She is oriented to person, place, and time. She appears well-developed and well-nourished. No distress.  HENT:  Head: Normocephalic and atraumatic.  Right Ear: External ear normal.  Left Ear: External ear normal.  Nose: Nose normal.  Mouth/Throat: Oropharynx is clear and moist. No oropharyngeal exudate.  Eyes: Conjunctivae are normal. Pupils are equal, round, and reactive to light. Right eye exhibits  no discharge. Left eye exhibits no discharge. No scleral icterus.  Neck: Normal range of motion. Neck supple. No tracheal deviation present. Thyromegaly present.  Cardiovascular: Normal rate, regular rhythm, normal heart sounds and intact distal pulses.   No murmur heard. Pulmonary/Chest: Effort normal and breath  sounds normal. No respiratory distress. She has no wheezes.  Abdominal: Soft. Bowel sounds are normal. She exhibits no distension. There is no tenderness. There is no rebound.  Musculoskeletal: Normal range of motion. She exhibits no edema and no tenderness.  Lymphadenopathy:    She has no cervical adenopathy.  Neurological: She is alert and oriented to person, place, and time.  Skin: Skin is warm and dry. No rash noted. She is not diaphoretic. No erythema.  Psychiatric: Her behavior is normal. Judgment normal.    Data Reviewed I reviewed her ultrasound showing multiple nodules. The greatest is over 4 cm in the lower pole of left thyroid lobe. Biopsy showed benign follicular cells  Assessment    Left thyroid lobe nodule     Plan    Because of the size of the nodule, left thyroid lobectomy is recommended for complete histologic evaluation to rule malignancy. I discussed this with her in detail and she is eager to proceed with surgery. I discussed the risks of surgery which includes but is not limited to bleeding, infection, injury to surrounding nerves including the recurrent laryngeal nerve, permanent hoarseness, injury to the parathyroid glands, need for complete thyroidectomy if cancer is present, et Ronney Asters. I also discussed postoperative recovery. She will be off of work for approximately 3 weeks postoperatively to recover        Jake Fuhrmann A 12/12/2013, 3:40 PM

## 2013-12-12 NOTE — Patient Instructions (Signed)
Our schedulers will call you back to schedule surgery  You will need to be off of work completely for 3 weeks postoperatively in order to recover from surgery

## 2014-01-10 ENCOUNTER — Encounter (HOSPITAL_COMMUNITY): Payer: Self-pay | Admitting: Pharmacy Technician

## 2014-01-11 ENCOUNTER — Encounter (HOSPITAL_COMMUNITY)
Admission: RE | Admit: 2014-01-11 | Discharge: 2014-01-11 | Disposition: A | Discharge status: Home / Self Care | Payer: 59 | Source: Ambulatory Visit | Attending: Surgery | Admitting: Surgery

## 2014-01-11 ENCOUNTER — Ambulatory Visit (HOSPITAL_COMMUNITY)
Admission: RE | Admit: 2014-01-11 | Discharge: 2014-01-11 | Disposition: A | Discharge status: Home / Self Care | Payer: 59 | Source: Ambulatory Visit | Attending: Anesthesiology | Admitting: Anesthesiology

## 2014-01-11 ENCOUNTER — Encounter (HOSPITAL_COMMUNITY): Payer: Self-pay

## 2014-01-11 DIAGNOSIS — E049 Nontoxic goiter, unspecified: Secondary | ICD-10-CM | POA: Insufficient documentation

## 2014-01-11 DIAGNOSIS — Z01811 Encounter for preprocedural respiratory examination: Secondary | ICD-10-CM | POA: Insufficient documentation

## 2014-01-11 LAB — CBC
HCT: 38.6 % (ref 36.0–46.0)
Hemoglobin: 12.6 g/dL (ref 12.0–15.0)
MCH: 27.3 pg (ref 26.0–34.0)
MCHC: 32.6 g/dL (ref 30.0–36.0)
MCV: 83.7 fL (ref 78.0–100.0)
Platelets: 304 10*3/uL (ref 150–400)
RBC: 4.61 MIL/uL (ref 3.87–5.11)
RDW: 13.7 % (ref 11.5–15.5)
WBC: 5.7 10*3/uL (ref 4.0–10.5)

## 2014-01-11 LAB — BASIC METABOLIC PANEL
Anion gap: 10 (ref 5–15)
BUN: 15 mg/dL (ref 6–23)
CO2: 26 meq/L (ref 19–32)
CREATININE: 0.74 mg/dL (ref 0.50–1.10)
Calcium: 9.2 mg/dL (ref 8.4–10.5)
Chloride: 100 mEq/L (ref 96–112)
GFR calc Af Amer: >90 mL/min (ref >90)
GFR calc non Af Amer: >90 mL/min (ref >90)
GLUCOSE: 114 mg/dL — AB (ref 70–99)
Potassium: 3.8 mEq/L (ref 3.7–5.3)
Sodium: 136 mEq/L — ABNORMAL LOW (ref 137–147)

## 2014-01-11 LAB — HCG, SERUM, QUALITATIVE: Preg, Serum: NEGATIVE

## 2014-01-11 NOTE — Pre-Procedure Instructions (Signed)
Yvonne Fisher  01/11/2014   Your procedure is scheduled on:  Wednesday September 23 rd at 1132 AM  Report to [facility] at [phone] AM.  Call this number if you have problems the morning of surgery: [phone]   Remember:   Do not eat food or drink liquids after midnight.   Take these medicines the morning of surgery with A SIP OF WATER: None   Do not wear jewelry, make-up or nail polish.  Do not wear lotions, powders, or perfumes. You may wear deodorant.  Do not shave 48 hours prior to surgery.   Do not bring valuables to the hospital.  [facility] is not responsible  for any belongings or valuables.               Contacts, dentures or bridgework may not be worn into surgery.  Leave suitcase in the car. After surgery it may be brought to your room.  For patients admitted to the hospital, discharge time is determined by your  treatment team.               Patients discharged the day of surgery will not be allowed to drive home.    Special Instructions: [place] - Preparing for Surgery  Before surgery, you can play an important role.  Because skin is not sterile, your skin needs to be as free of germs as possible.  You can reduce the number of germs on you skin by washing with CHG (chlorahexidine gluconate) soap before surgery.  CHG is an antiseptic cleaner which kills germs and bonds with the skin to continue killing germs even after washing.  Please DO NOT use if you have an allergy to CHG or antibacterial soaps.  If your skin becomes reddened/irritated stop using the CHG and inform your nurse when you arrive at Short Stay.  Do not shave (including legs and underarms) for at least 48 hours prior to the first CHG shower.  You may shave your face.  Please follow these instructions carefully:   1.  Shower with CHG Soap the night before surgery and the                                morning of Surgery.  2.  If you choose to wash your hair, wash your hair first  as usual with your       normal shampoo.  3.  After you shampoo, rinse your hair and body thoroughly to remove the                      Shampoo.  4.  Use CHG as you would any other liquid soap.  You can apply chg directly       to the skin and wash gently with scrungie or a clean washcloth.  5.  Apply the CHG Soap to your body ONLY FROM THE NECK DOWN.        Do not use on open wounds or open sores.  Avoid contact with your eyes,       ears, mouth and genitals (private parts).  Wash genitals (private parts)       with your normal soap.  6.  Wash thoroughly, paying special attention to the area where your surgery        will be performed.  7.  Thoroughly rinse your body with warm water from the neck  down.  8.  DO NOT shower/wash with your normal soap after using and rinsing off       the CHG Soap.  9.  Pat yourself dry with a clean towel.            10.  Wear clean pajamas.            11.  Place clean sheets on your bed the night of your first shower and do not        sleep with pets.  Day of Surgery  Do not apply any lotions/deoderants the morning of surgery.  Please wear clean clothes to the hospital/surgery center.      Please read over the following fact sheets that you were given: Pain Booklet, Coughing and Deep Breathing and Surgical Site Infection Prevention

## 2014-01-12 NOTE — Progress Notes (Addendum)
Anesthesia Chart Review: Pt is 38 year old female scheduled for L thyroid lobectomy on 01/18/14 with Dr. Evlyn Courier.  Surgery is needed for a definitive diagnosis.   PMH: Non-smoker, gestational diabetes, hidradenitis s/p excision left axilla hidradenitis 11/2011, arthritis, cholecystectomy '12. BMI 35 consistent with obesity. PCP is Dr. Reginia Forts, newly established on 11/02/13.  No medications reported.   Preoperative labs reviewed.  A1C was 5.9, and she had normal TSH and free T4 on 11/02/13.  Normal Chest x-ray on 01/11/14.  EKG: NSR, possible inferior infarct (age undetermined). The interpreting cardiologist did not feel that it was significantly changed since her last tracing on 05/19/09.  She has undergone two surgeries since 2011.  No CV symptoms documented at her PAT visit or at her recent visit with Dr. Ninfa Linden and Dr. Tamala Julian. No known CAD/MI or CHF history. She will be further evaluated by her assigned anesthesiologist on the day of surgery, but if no acute changes then I would anticipate that she could proceed as planned.  George Hugh [facility] Short Stay Center/Anesthesiology Phone [phone] 01/13/2014 10:45 AM

## 2014-01-17 MED ORDER — CEFAZOLIN SODIUM-DEXTROSE 2-3 GM-% IV SOLR
2.0000 g | INTRAVENOUS | Status: AC
  Administered 2014-01-18: 2 g via INTRAVENOUS
  Filled 2014-01-17: qty 50

## 2014-01-17 NOTE — H&P (Signed)
Chief Complaint   Patient presents with   .  thyroid eval   HPI  Yvonne Fisher is a 38 y.o. female.  HPI  This is a pleasant patient of mine who had operated on for hidradenitis. She now comes in with a new diagnosis of a thyroid nodule. She was recently evaluated by her primary care physician who noticed an enlarged thyroid gland with nodules prompting an ultrasound and a biopsy. Currently she is without complaints. She has no difficulty swallowing. She has no symptoms of hyper or hypothyroidism. She is well healed from her head at night of surgery.  Past Medical History   Diagnosis  Date   .  Arthritis      right elbow   .  Hidradenitis  11/2011     left axilla   .  Rash  12/01/2011     bilat. axilla   .  Gestational diabetes mellitus, antepartum    .  Gestational diabetes      diet controlled    Past Surgical History   Procedure  Laterality  Date   .  Elbow arthroscopy   1997     right elbow   .  Cholecystectomy   04/15/2011     Procedure: LAPAROSCOPIC CHOLECYSTECTOMY WITH INTRAOPERATIVE CHOLANGIOGRAM; Surgeon: Adin Hector, MD; Location: [facility]; Service: General; Laterality: N/A; laparoscopic cholecystectomy with cholangiogram   .  Hydradenitis excision   12/05/2011     Procedure: EXCISION HYDRADENITIS AXILLA; Surgeon: Harl Bowie, MD; Location: [place]; Service: General; Laterality: Left; wide excision hidradenitis left axilla    Family History   Problem  Relation  Age of Onset   .  Hypertension  Mother    .  Multiple sclerosis  Mother    .  Diabetes  Father    .  Heart disease  Father  80     [facility]   Social History  History   Substance Use Topics   .  Smoking status:  Never Smoker   .  Smokeless tobacco:  Never Used   .  Alcohol Use:  Yes      Comment: occasionally    Allergies   Allergen  Reactions   .  Adhesive [Tape]  Other (See Comments)     TEARS SKIN   .  Prevacid [Lansoprazole]  Hives    No current outpatient prescriptions on  file.    No current facility-administered medications for this visit.   Review of Systems  Review of Systems  Constitutional: Negative for fever, chills and unexpected weight change.  HENT: Negative for congestion, hearing loss, sore throat, trouble swallowing and voice change.  Eyes: Negative for visual disturbance.  Respiratory: Negative for cough and wheezing.  Cardiovascular: Negative for chest pain, palpitations and leg swelling.  Gastrointestinal: Negative for nausea, vomiting, abdominal pain, diarrhea, constipation, blood in stool, abdominal distention and anal bleeding.  Genitourinary: Negative for hematuria, vaginal bleeding and difficulty urinating.  Musculoskeletal: Negative for arthralgias.  Skin: Negative for rash and wound.  Neurological: Negative for seizures, syncope and headaches.  Hematological: Negative for adenopathy. Does not bruise/bleed easily.  Psychiatric/Behavioral: Negative for confusion.  Blood pressure 160/100, pulse 98, temperature 97.6 F (36.4 C), temperature source Temporal, height 5\' 8"  (1.727 m), weight 237 lb 8 oz (107.729 kg), unknown if currently breastfeeding.  Physical Exam  Physical Exam  Constitutional: She is oriented to person, place, and time. She appears well-developed and well-nourished. No distress.  HENT:  Head: Normocephalic and  atraumatic.  Right Ear: External ear normal.  Left Ear: External ear normal.  Nose: Nose normal.  Mouth/Throat: Oropharynx is clear and moist. No oropharyngeal exudate.  Eyes: Conjunctivae are normal. Pupils are equal, round, and reactive to light. Right eye exhibits no discharge. Left eye exhibits no discharge. No scleral icterus.  Neck: Normal range of motion. Neck supple. No tracheal deviation present. Thyromegaly present.  Cardiovascular: Normal rate, regular rhythm, normal heart sounds and intact distal pulses.  No murmur heard.  Pulmonary/Chest: Effort normal and breath sounds normal. No respiratory  distress. She has no wheezes.  Abdominal: Soft. Bowel sounds are normal. She exhibits no distension. There is no tenderness. There is no rebound.  Musculoskeletal: Normal range of motion. She exhibits no edema and no tenderness.  Lymphadenopathy:  She has no cervical adenopathy.  Neurological: She is alert and oriented to person, place, and time.  Skin: Skin is warm and dry. No rash noted. She is not diaphoretic. No erythema.  Psychiatric: Her behavior is normal. Judgment normal.  Data Reviewed  I reviewed her ultrasound showing multiple nodules. The greatest is over 4 cm in the lower pole of left thyroid lobe. Biopsy showed benign follicular cells  Assessment  Left thyroid lobe nodule  Plan  Because of the size of the nodule, left thyroid lobectomy is recommended for complete histologic evaluation to rule malignancy. I discussed this with her in detail and she is eager to proceed with surgery. I discussed the risks of surgery which includes but is not limited to bleeding, infection, injury to surrounding nerves including the recurrent laryngeal nerve, permanent hoarseness, injury to the parathyroid glands, need for complete thyroidectomy if cancer is present, et Ronney Asters. I also discussed postoperative recovery. She will be off of work for approximately 3 weeks postoperatively to recover

## 2014-01-18 ENCOUNTER — Encounter (HOSPITAL_COMMUNITY): Payer: Self-pay | Admitting: *Deleted

## 2014-01-18 ENCOUNTER — Ambulatory Visit (HOSPITAL_COMMUNITY): Payer: 59 | Admitting: Anesthesiology

## 2014-01-18 ENCOUNTER — Encounter (HOSPITAL_COMMUNITY): Payer: 59 | Admitting: Emergency Medicine

## 2014-01-18 ENCOUNTER — Observation Stay (HOSPITAL_COMMUNITY)
Admission: RE | Admit: 2014-01-18 | Discharge: 2014-01-19 | Disposition: A | Discharge status: Home / Self Care | Payer: 59 | Source: Ambulatory Visit | Attending: Surgery | Admitting: Surgery

## 2014-01-18 ENCOUNTER — Encounter (HOSPITAL_COMMUNITY)
Admission: RE | Disposition: A | Discharge status: Home / Self Care | Payer: Self-pay | Source: Ambulatory Visit | Attending: Surgery

## 2014-01-18 DIAGNOSIS — E041 Nontoxic single thyroid nodule: Secondary | ICD-10-CM | POA: Diagnosis not present

## 2014-01-18 DIAGNOSIS — M19029 Primary osteoarthritis, unspecified elbow: Secondary | ICD-10-CM | POA: Insufficient documentation

## 2014-01-18 DIAGNOSIS — Z9089 Acquired absence of other organs: Secondary | ICD-10-CM | POA: Diagnosis not present

## 2014-01-18 HISTORY — PX: THYROID LOBECTOMY: SHX420

## 2014-01-18 HISTORY — DX: Gestational (pregnancy-induced) hypertension without significant proteinuria, unspecified trimester: O13.9

## 2014-01-18 HISTORY — PX: THYROIDECTOMY: SHX17

## 2014-01-18 HISTORY — DX: Nontoxic single thyroid nodule: E04.1

## 2014-01-18 HISTORY — DX: Gastro-esophageal reflux disease without esophagitis: K21.9

## 2014-01-18 LAB — GLUCOSE, CAPILLARY: GLUCOSE-CAPILLARY: 134 mg/dL — AB (ref 70–99)

## 2014-01-18 SURGERY — THYROIDECTOMY
Anesthesia: General | Site: Neck | Laterality: Left

## 2014-01-18 MED ORDER — PROPOFOL 10 MG/ML IV BOLUS
INTRAVENOUS | Status: AC
Start: 2014-01-18 — End: 2014-01-18
  Filled 2014-01-18: qty 20

## 2014-01-18 MED ORDER — MIDAZOLAM HCL 5 MG/5ML IJ SOLN
INTRAMUSCULAR | Status: DC | PRN
  Administered 2014-01-18: 2 mg via INTRAVENOUS

## 2014-01-18 MED ORDER — FENTANYL CITRATE 0.05 MG/ML IJ SOLN
INTRAMUSCULAR | Status: DC | PRN
  Administered 2014-01-18: 100 ug via INTRAVENOUS
  Administered 2014-01-18 (×3): 50 ug via INTRAVENOUS

## 2014-01-18 MED ORDER — POTASSIUM CHLORIDE IN NACL 20-0.9 MEQ/L-% IV SOLN
INTRAVENOUS | Status: DC
  Administered 2014-01-18 – 2014-01-19 (×2): via INTRAVENOUS
  Filled 2014-01-18 (×3): qty 1000

## 2014-01-18 MED ORDER — FENTANYL CITRATE 0.05 MG/ML IJ SOLN: 25.0000 ug | INTRAMUSCULAR | Status: DC | PRN

## 2014-01-18 MED ORDER — MORPHINE SULFATE 2 MG/ML IJ SOLN
1.0000 mg | INTRAMUSCULAR | Status: DC | PRN
  Administered 2014-01-18: 2 mg via INTRAVENOUS
  Filled 2014-01-18: qty 1

## 2014-01-18 MED ORDER — LIDOCAINE HCL (CARDIAC) 20 MG/ML IV SOLN
INTRAVENOUS | Status: AC
  Filled 2014-01-18: qty 10

## 2014-01-18 MED ORDER — ENOXAPARIN SODIUM 40 MG/0.4ML ~~LOC~~ SOLN
40.0000 mg | SUBCUTANEOUS | Status: DC
Start: 2014-01-19 — End: 2014-01-19
  Administered 2014-01-19: 40 mg via SUBCUTANEOUS
  Filled 2014-01-18: qty 0.4

## 2014-01-18 MED ORDER — ONDANSETRON HCL 4 MG/2ML IJ SOLN
INTRAMUSCULAR | Status: DC | PRN
  Administered 2014-01-18: 4 mg via INTRAVENOUS

## 2014-01-18 MED ORDER — LIDOCAINE HCL 4 % MT SOLN
OROMUCOSAL | Status: DC | PRN
  Administered 2014-01-18: 4 mL via TOPICAL

## 2014-01-18 MED ORDER — PROPOFOL 10 MG/ML IV BOLUS
INTRAVENOUS | Status: DC | PRN
  Administered 2014-01-18: 200 mg via INTRAVENOUS

## 2014-01-18 MED ORDER — NEOSTIGMINE METHYLSULFATE 10 MG/10ML IV SOLN
INTRAVENOUS | Status: DC | PRN
  Administered 2014-01-18: 4 mg via INTRAVENOUS

## 2014-01-18 MED ORDER — HEMOSTATIC AGENTS (NO CHARGE) OPTIME
TOPICAL | Status: DC | PRN
  Administered 2014-01-18: 1 via TOPICAL

## 2014-01-18 MED ORDER — DEXAMETHASONE SODIUM PHOSPHATE 4 MG/ML IJ SOLN
INTRAMUSCULAR | Status: AC
  Filled 2014-01-18: qty 2

## 2014-01-18 MED ORDER — LIDOCAINE HCL (CARDIAC) 20 MG/ML IV SOLN
INTRAVENOUS | Status: DC | PRN
  Administered 2014-01-18: 100 mg via INTRAVENOUS

## 2014-01-18 MED ORDER — MIDAZOLAM HCL 2 MG/2ML IJ SOLN
INTRAMUSCULAR | Status: AC
  Filled 2014-01-18: qty 2

## 2014-01-18 MED ORDER — MEPERIDINE HCL 25 MG/ML IJ SOLN: 6.2500 mg | INTRAMUSCULAR | Status: DC | PRN

## 2014-01-18 MED ORDER — ROCURONIUM BROMIDE 100 MG/10ML IV SOLN
INTRAVENOUS | Status: DC | PRN
  Administered 2014-01-18: 30 mg via INTRAVENOUS

## 2014-01-18 MED ORDER — KETOROLAC TROMETHAMINE 30 MG/ML IJ SOLN
INTRAMUSCULAR | Status: AC
  Filled 2014-01-18: qty 1

## 2014-01-18 MED ORDER — OXYCODONE-ACETAMINOPHEN 5-325 MG PO TABS
1.0000 | ORAL_TABLET | ORAL | Status: DC | PRN
  Administered 2014-01-19: 2 via ORAL
  Filled 2014-01-18: qty 2

## 2014-01-18 MED ORDER — 0.9 % SODIUM CHLORIDE (POUR BTL) OPTIME
TOPICAL | Status: DC | PRN
  Administered 2014-01-18: 1000 mL

## 2014-01-18 MED ORDER — LACTATED RINGERS IV SOLN
INTRAVENOUS | Status: DC | PRN
  Administered 2014-01-18 (×2): via INTRAVENOUS

## 2014-01-18 MED ORDER — LACTATED RINGERS IV SOLN: INTRAVENOUS | Status: DC

## 2014-01-18 MED ORDER — DEXAMETHASONE SODIUM PHOSPHATE 4 MG/ML IJ SOLN
INTRAMUSCULAR | Status: DC | PRN
  Administered 2014-01-18: 8 mg via INTRAVENOUS

## 2014-01-18 MED ORDER — ROCURONIUM BROMIDE 50 MG/5ML IV SOLN
INTRAVENOUS | Status: AC
Start: 2014-01-18 — End: 2014-01-18
  Filled 2014-01-18: qty 1

## 2014-01-18 MED ORDER — ONDANSETRON HCL 4 MG/2ML IJ SOLN
INTRAMUSCULAR | Status: AC
  Filled 2014-01-18: qty 2

## 2014-01-18 MED ORDER — FENTANYL CITRATE 0.05 MG/ML IJ SOLN
INTRAMUSCULAR | Status: AC
  Filled 2014-01-18: qty 5

## 2014-01-18 MED ORDER — PROMETHAZINE HCL 25 MG/ML IJ SOLN: 6.2500 mg | INTRAMUSCULAR | Status: DC | PRN

## 2014-01-18 MED ORDER — ONDANSETRON HCL 4 MG/2ML IJ SOLN
INTRAMUSCULAR | Status: AC
  Filled 2014-01-18: qty 6

## 2014-01-18 MED ORDER — NEOSTIGMINE METHYLSULFATE 10 MG/10ML IV SOLN
INTRAVENOUS | Status: AC
  Filled 2014-01-18: qty 1

## 2014-01-18 MED ORDER — GLYCOPYRROLATE 0.2 MG/ML IJ SOLN
INTRAMUSCULAR | Status: AC
  Filled 2014-01-18: qty 3

## 2014-01-18 MED ORDER — GLYCOPYRROLATE 0.2 MG/ML IJ SOLN
INTRAMUSCULAR | Status: DC | PRN
  Administered 2014-01-18: 0.6 mg via INTRAVENOUS

## 2014-01-18 SURGICAL SUPPLY — 54 items
APL SKNCLS STERI-STRIP NONHPOA (GAUZE/BANDAGES/DRESSINGS) ×1
BENZOIN TINCTURE PRP APPL 2/3 (GAUZE/BANDAGES/DRESSINGS) ×2 IMPLANT
BLADE SURG 10 STRL SS (BLADE) ×1 IMPLANT
BLADE SURG 15 STRL LF DISP TIS (BLADE) IMPLANT
BLADE SURG 15 STRL SS (BLADE) ×2
CANISTER SUCTION 2500CC (MISCELLANEOUS) ×2 IMPLANT
CHLORAPREP W/TINT 10.5 ML (MISCELLANEOUS) ×2 IMPLANT
CLIP TI MEDIUM 24 (CLIP) ×2 IMPLANT
CLIP TI WIDE RED SMALL 24 (CLIP) ×2 IMPLANT
CONT SPEC 4OZ CLIKSEAL STRL BL (MISCELLANEOUS) IMPLANT
COVER SURGICAL LIGHT HANDLE (MISCELLANEOUS) ×3 IMPLANT
CRADLE DONUT ADULT HEAD (MISCELLANEOUS) ×2 IMPLANT
DRAPE PED LAPAROTOMY (DRAPES) ×2 IMPLANT
DRAPE UTILITY 15X26 W/TAPE STR (DRAPE) ×4 IMPLANT
ELECT CAUTERY BLADE 6.4 (BLADE) ×2 IMPLANT
ELECT REM PT RETURN 9FT ADLT (ELECTROSURGICAL) ×2
ELECTRODE REM PT RTRN 9FT ADLT (ELECTROSURGICAL) ×1 IMPLANT
GAUZE SPONGE 4X4 12PLY STRL (GAUZE/BANDAGES/DRESSINGS) ×2 IMPLANT
GAUZE SPONGE 4X4 16PLY XRAY LF (GAUZE/BANDAGES/DRESSINGS) ×2 IMPLANT
GLOVE BIO SURGEON STRL SZ7 (GLOVE) ×1 IMPLANT
GLOVE BIOGEL PI IND STRL 6.5 (GLOVE) IMPLANT
GLOVE BIOGEL PI IND STRL 7.0 (GLOVE) IMPLANT
GLOVE BIOGEL PI INDICATOR 6.5 (GLOVE) ×1
GLOVE BIOGEL PI INDICATOR 7.0 (GLOVE) ×1
GLOVE EUDERMIC 7 POWDERFREE (GLOVE) ×1 IMPLANT
GLOVE SURG ORTHO 6.0 STRL STRW (GLOVE) ×1 IMPLANT
GLOVE SURG SIGNA 7.5 PF LTX (GLOVE) ×2 IMPLANT
GOWN STRL REUS W/ TWL LRG LVL3 (GOWN DISPOSABLE) ×2 IMPLANT
GOWN STRL REUS W/ TWL XL LVL3 (GOWN DISPOSABLE) ×1 IMPLANT
GOWN STRL REUS W/TWL LRG LVL3 (GOWN DISPOSABLE) ×6
GOWN STRL REUS W/TWL XL LVL3 (GOWN DISPOSABLE) ×2
HEMOSTAT SURGICEL 2X4 FIBR (HEMOSTASIS) ×1 IMPLANT
KIT BASIN OR (CUSTOM PROCEDURE TRAY) ×2 IMPLANT
KIT ROOM TURNOVER OR (KITS) ×2 IMPLANT
NS IRRIG 1000ML POUR BTL (IV SOLUTION) ×2 IMPLANT
PACK SURGICAL SETUP 50X90 (CUSTOM PROCEDURE TRAY) ×2 IMPLANT
PAD ARMBOARD 7.5X6 YLW CONV (MISCELLANEOUS) ×4 IMPLANT
PENCIL BUTTON HOLSTER BLD 10FT (ELECTRODE) ×2 IMPLANT
SHEARS HARMONIC 9CM CVD (BLADE) ×2 IMPLANT
SPECIMEN JAR MEDIUM (MISCELLANEOUS) ×1 IMPLANT
SPONGE GAUZE 4X4 12PLY STER LF (GAUZE/BANDAGES/DRESSINGS) ×1 IMPLANT
SPONGE INTESTINAL PEANUT (DISPOSABLE) ×2 IMPLANT
STRIP CLOSURE SKIN 1/2X4 (GAUZE/BANDAGES/DRESSINGS) ×2 IMPLANT
SUT MNCRL AB 4-0 PS2 18 (SUTURE) ×2 IMPLANT
SUT SILK 2 0 (SUTURE) ×2
SUT SILK 2-0 18XBRD TIE 12 (SUTURE) ×1 IMPLANT
SUT SILK 3 0 (SUTURE) ×2
SUT SILK 3-0 18XBRD TIE 12 (SUTURE) ×1 IMPLANT
SUT VIC AB 3-0 SH 18 (SUTURE) ×3 IMPLANT
SYR BULB 3OZ (MISCELLANEOUS) ×2 IMPLANT
TOWEL OR 17X24 6PK STRL BLUE (TOWEL DISPOSABLE) ×2 IMPLANT
TOWEL OR 17X26 10 PK STRL BLUE (TOWEL DISPOSABLE) ×2 IMPLANT
TUBE CONNECTING 12X1/4 (SUCTIONS) ×2 IMPLANT
WATER STERILE IRR 1000ML POUR (IV SOLUTION) IMPLANT

## 2014-01-18 NOTE — Op Note (Signed)
LEFT THYROID LOBECTOMY  Procedure Note  Yvonne Fisher 01/18/2014   Pre-op Diagnosis: Left Thyroid nodule     Post-op Diagnosis: same  Procedure(s): LEFT THYROID LOBECTOMY  Surgeon(s): Fanny Skates, MD Coralie Keens, MD  Anesthesia: General  Staff:  Circulator: Beryle Lathe, RN; Tomma Rakers Sipsis, RN Relief Scrub: Charna Elizabeth Scrub Person: Jon Gills Kretschmaier  Estimated Blood Loss: Minimal               Specimens: sent to path          [facility]   Date: 01/18/2014  Time: 12:17 PM

## 2014-01-18 NOTE — Transfer of Care (Signed)
Immediate Anesthesia Transfer of Care Note  Patient: Yvonne Fisher  Procedure(s) Performed: Procedure(s): LEFT THYROID LOBECTOMY (Left)  Patient Location: PACU  Anesthesia Type:General  Level of Consciousness: awake, alert , oriented and patient cooperative  Airway & Oxygen Therapy: Patient Spontanous Breathing and Patient connected to nasal cannula oxygen  Post-op Assessment: Report given to PACU RN, Post -op Vital signs reviewed and stable and Patient moving all extremities  Post vital signs: Reviewed and stable  Complications: No apparent anesthesia complications

## 2014-01-18 NOTE — Anesthesia Preprocedure Evaluation (Addendum)
Anesthesia Evaluation  Patient identified by MRN, date of birth, ID band Patient awake    Reviewed: Allergy & Precautions, H&P , NPO status , Patient's Chart, lab work & pertinent test results  History of Anesthesia Complications Negative for: history of anesthetic complications  Airway Mallampati: II TM Distance: >3 FB Neck ROM: Full    Dental no notable dental hx. (+) Dental Advisory Given,    Pulmonary neg pulmonary ROS,  breath sounds clear to auscultation  Pulmonary exam normal       Cardiovascular negative cardio ROS  Rhythm:Regular Rate:Normal     Neuro/Psych negative neurological ROS  negative psych ROS   GI/Hepatic negative GI ROS, Neg liver ROS,   Endo/Other  negative endocrine ROSdiabetes, Gestational  Renal/GU negative Renal ROS  negative genitourinary   Musculoskeletal negative musculoskeletal ROS (+)   Abdominal   Peds negative pediatric ROS (+)  Hematology negative hematology ROS (+)   Anesthesia Other Findings   Reproductive/Obstetrics negative OB ROS                        Anesthesia Physical Anesthesia Plan  ASA: II  Anesthesia Plan: General   Post-op Pain Management:    Induction: Intravenous  Airway Management Planned: Oral ETT  Additional Equipment:   Intra-op Plan:   Post-operative Plan: Extubation in OR  Informed Consent: I have reviewed the patients History and Physical, chart, labs and discussed the procedure including the risks, benefits and alternatives for the proposed anesthesia with the patient or authorized representative who has indicated his/her understanding and acceptance.   Dental advisory given  Plan Discussed with: CRNA  Anesthesia Plan Comments:         Anesthesia Quick Evaluation

## 2014-01-18 NOTE — Anesthesia Postprocedure Evaluation (Signed)
  Anesthesia Post-op Note  Patient: Yvonne Fisher  Procedure(s) Performed: Procedure(s) (LRB): LEFT THYROID LOBECTOMY (Left)  Patient Location: PACU  Anesthesia Type: General  Level of Consciousness: awake and alert   Airway and Oxygen Therapy: Patient Spontanous Breathing  Post-op Pain: mild  Post-op Assessment: Post-op Vital signs reviewed, Patient's Cardiovascular Status Stable, Respiratory Function Stable, Patent Airway and No signs of Nausea or vomiting  Last Vitals:  Filed Vitals:   01/18/14 1306  BP: 158/84  Pulse: 62  Temp:   Resp: 23    Post-op Vital Signs: stable   Complications: No apparent anesthesia complications

## 2014-01-18 NOTE — Interval H&P Note (Signed)
History and Physical Interval Note: no change in H and P  01/18/2014 9:31 AM  Yvonne Fisher  has presented today for surgery, with the diagnosis of Left Thyroid  The various methods of treatment have been discussed with the patient and family. After consideration of risks, benefits and other options for treatment, the patient has consented to  Procedure(s): THYROIDECTOMY (Left) as a surgical intervention .  The patient's history has been reviewed, patient examined, no change in status, stable for surgery.  I have reviewed the patient's chart and labs.  Questions were answered to the patient's satisfaction.     [name] A

## 2014-01-19 DIAGNOSIS — E041 Nontoxic single thyroid nodule: Secondary | ICD-10-CM | POA: Diagnosis not present

## 2014-01-19 MED ORDER — OXYCODONE-ACETAMINOPHEN 5-325 MG PO TABS: 1.0000 | ORAL_TABLET | ORAL | Status: DC | PRN

## 2014-01-19 NOTE — Discharge Summary (Signed)
Physician Discharge Summary  Patient ID: Yvonne Fisher MRN: [id number] DOB/AGE: [birth year] 38 y.o.  Admit date: 01/18/2014 Discharge date: 01/19/2014  Admission Diagnoses:  Discharge Diagnoses:  Active Problems:   Thyroid nodule   Discharged Condition: good  Hospital Course: uneventful post op recovery.  Discharged POD#1  Consults: None  Significant Diagnostic Studies:   Treatments: surgery: left thyroid lobectomy  Discharge Exam: Blood pressure 134/70, pulse 76, temperature 98.1 F (36.7 C), temperature source Oral, resp. rate 18, height 5\' 8"  (1.727 m), weight 234 lb 9.1 oz (106.4 kg), last menstrual period 12/07/2013, SpO2 98.00%, not currently breastfeeding. Neck: no adenopathy, no carotid bruit, no JVD, supple, symmetrical, trachea midline, thyroid not enlarged, symmetric, no tenderness/mass/nodules and no hematoma, dressing dry Resp: clear to auscultation bilaterally Cardio: regular rate and rhythm, S1, S2 normal, no murmur, click, rub or gallop  Disposition: 01-Home or Self Care     Medication List         oxyCODONE-acetaminophen 5-325 MG per tablet  Commonly known as:  PERCOCET/ROXICET  Take 1-2 tablets by mouth every 4 (four) hours as needed for moderate pain.           Follow-up Information   Follow up with [facility] A, MD. Schedule an appointment as soon as possible for a visit in 2 weeks.   Specialty:  General Surgery   Contact information:   [address] [place] [address] [phone]       Signed: [name] 01/19/2014, 7:11 AM

## 2014-01-19 NOTE — Progress Notes (Signed)
Yvonne Fisher to be D/C'd Home per MD order.  Discussed with the patient and all questions fully answered.    Medication List         oxyCODONE-acetaminophen 5-325 MG per tablet  Commonly known as:  PERCOCET/ROXICET  Take 1-2 tablets by mouth every 4 (four) hours as needed for moderate pain.        VSS, Skin clean, dry and intact without evidence of skin break down, no evidence of skin tears noted. Surgical site dressing clean, dry, intact.   IV catheter discontinued intact. Site without signs and symptoms of complications. Dressing and pressure applied.  An After Visit Summary was printed and given to the patient.  D/c education completed with patient/family including follow up instructions, medication list, d/c activities limitations if indicated, with other d/c instructions as indicated by MD - patient able to verbalize understanding, all questions fully answered. Patient received 1 prescription.  Patient instructed to return to ED, call 911, or call MD for any changes in condition.   Patient escorted via [place], and D/C home via private auto.  [name] 01/19/2014 10:40 AM

## 2014-01-19 NOTE — Op Note (Signed)
[name], [name]               ACCOUNT NO.:  [id number]  MEDICAL RECORD NO.:  [id number]  LOCATION:  6N03C                        FACILITY:  [facility]  PHYSICIAN:  Coralie Keens, M.D. DATE OF BIRTH:  1976/03/04  DATE OF PROCEDURE:  01/18/2014 DATE OF DISCHARGE:                              OPERATIVE REPORT   PREOPERATIVE DIAGNOSIS:  Left thyroid nodule.  POSTOPERATIVE DIAGNOSIS:  Left thyroid nodule.  PROCEDURE:  Left thyroid lobectomy.  SURGEON:  Coralie Keens, M.D.  ASSISTANT:  Edsel Petrin. Dalbert Batman, M.D.  ANESTHESIA:  General endotracheal anesthesia.  ESTIMATED BLOOD LOSS:  Minimal.  INDICATIONS:  This is a 38 year old female who presents with a large almost 5-cm nodule in the inferior left thyroid lobe.  Fine needle aspiration showed follicular cells.  Because of the large size of the nodule, decision was made to proceed with thyroid lobectomy.  FINDINGS:  The patient was indeed found to have a very large nodule at the left thyroid lobe.  There was a smaller nodule on the right side. Decision was made to proceed with lobectomy.  PROCEDURE IN DETAIL:  The patient was brought to the operating room and identified as Yvonne Fisher.  She was placed supine on the operating room table and general anesthesia was induced.  Her neck was then prepped and draped in usual sterile fashion.  I made a small transverse incision along the lower neck with a scalpel.  I took this down through the platysma with electrocautery.  I then performed superior and inferior skin flaps with electrocautery.  I then opened the midline with the cautery going down between the strap muscles.  The muscles were then retracted laterally.  The patient had a very large nodule that appeared to consume almost the entire left thyroid lobe.  I dissected close to the capsule, lifting the nodule up out of the neck.  I took down the lower pole of the left lobe of thyroid with the Harmonic scalpel.  I then took  down the superior pole with clips as well as the Harmonic scalpel.  I then lifted nodule further and stayed close to the capsule, dissecting towards the mid section of the thyroid gland.  The bridging veins were clipped as well as transected with a Harmonic scalpels, 2.  I identified both the superior and inferior parathyroid glands during the dissection on this side and kept them intact.  The recurrent laryngeal nerve was lateral as well.  I was able to continue to stay close to the thyroid gland, taking the thyroid artery with surgical clips as well.  I stayed right on the capsule with the gland going down to the trachea and was able to.  With the help of the large size of the nodule, lift it up off the trachea.  I continued dissection and was able to easily spare the recurrent laryngeal nerve.  Once I reached the midline in the isthmus, I transected the thyroid gland with the Harmonic Scalpel.  The large nodule and left lobe were then sent to Pathology for evaluation. I then thoroughly irrigated the neck with saline.  Hemostasis appeared to be achieved.  I placed several pieces of fibrillar in the neck dissection  area.  I then reapproximated the midline with interrupted 3-0 Vicryl sutures.  The platysma was then closed with interrupted 3-0 Vicryl sutures and the skin was closed with running 4-0 Monocryl.  Steri- Strips, gauze, and Tegaderm were then applied.  The patient tolerated the procedure well.  All the counts were correct at the end of procedure.  The patient was then extubated in the operating room and taken in stable condition to recovery room.     Coralie Keens, M.D.     DB/MEDQ  D:  01/18/2014  T:  01/18/2014  Job:  [id number]

## 2014-01-19 NOTE — Progress Notes (Signed)
Patient ID: Yvonne Fisher, female   DOB: [birth year], 38 y.o.   MRN: [id number]  POD#1 Some soreness and hoarseness but otherwise ok  Neck without hematoma Voice mildly hoarse  Plan:  Discharge home

## 2014-01-19 NOTE — Discharge Instructions (Signed)
[facility], [place] [phone]  THYROID/ PARATHYROID SURGERY: POST OP INSTRUCTIONS  Always review your discharge instruction sheet given to you by the facility where your surgery was performed.  IF YOU HAVE DISABILITY OR FAMILY LEAVE FORMS, YOU MUST BRING THEM TO THE OFFICE FOR PROCESSING.  PLEASE DO NOT GIVE THEM TO YOUR DOCTOR.  1. A prescription for pain medication may be given to you upon discharge.  Take your pain medication as prescribed, if needed.  If narcotic pain medicine is not needed, then you may take acetaminophen (Tylenol) or ibuprofen (Advil) as needed. 2. Take your usually prescribed medications unless otherwise directed. 3. If you need a refill on your pain medication, please contact your pharmacy. They will contact our office to request authorization.  Prescriptions will not be filled after 5pm or on week-ends. 4. You should follow a light diet the first 24 hours after arrival home, such as soup and crackers, etc.  Be sure to include lots of fluids daily.  Resume your normal diet the day after surgery. 5. Most patients will experience some swelling and bruising on the chest and neck area.  Ice packs will help.  Swelling and bruising can take several days to resolve.  6. It is common to experience some constipation if taking pain medication after surgery.  Increasing fluid intake and taking a stool softener will usually help or prevent this problem from occurring.  A mild laxative (Milk of Magnesia or Miralax) should be taken according to package directions if there are no bowel movements after 48 hours. 7. Unless discharge instructions indicate otherwise, you may remove your bandages 24-48 hours after surgery, and you may shower at that time.  You may have steri-strips (small skin tapes) in place directly over the incision.  These strips should be left on the skin for 7-10 days.  If your surgeon used skin glue on the incision, you may today.  The glue will flake  off over the next 2-3 weeks.  Any sutures or staples will be removed at the office during your follow-up visit. 8. ACTIVITIES:  You may resume regular (light) daily activities beginning the next day--such as daily self-care, walking, climbing stairs--gradually increasing activities as tolerated.  You may have sexual intercourse when it is comfortable.  Refrain from any heavy lifting or straining until approved by your doctor. a. You may drive when you no longer are taking prescription pain medication, you can comfortably wear a seatbelt, and you can safely maneuver your car and apply brakes b. RETURN TO WORK:  __________________________________________________________ 9. You should see your doctor in the office for a follow-up appointment approximately two weeks after your surgery.  Make sure that you call for this appointment within a day or two after you arrive home to insure a convenient appointment time. 10. OTHER INSTRUCTIONS: ____________________________________________________________________________ _________________________________________________________________________________________________________________ _________________________________________________________________________________________________________________   WHEN TO CALL YOUR DOCTOR: 1. Fever over 101.0 2. Inability to urinate 3. Nausea and/or vomiting 4. Extreme swelling or bruising 5. Continued bleeding from incision. 6. Increased pain, redness, or drainage from the incision. 7. Difficulty swallowing or breathing 8. Muscle cramping or spasms. 9. Numbness or tingling in hands or feet or around lips.  The clinic staff is available to answer your questions during regular business hours.  Please dont hesitate to call and ask to speak to one of the nurses if you have concerns.  For further questions, please visit [email or web address]

## 2014-01-20 ENCOUNTER — Encounter (HOSPITAL_COMMUNITY): Payer: Self-pay | Admitting: Surgery

## 2014-02-01 ENCOUNTER — Encounter: Payer: Self-pay | Admitting: Family Medicine

## 2014-02-01 ENCOUNTER — Ambulatory Visit (INDEPENDENT_AMBULATORY_CARE_PROVIDER_SITE_OTHER): Payer: 59 | Admitting: Family Medicine

## 2014-02-01 VITALS — BP 138/84 | HR 85 | Temp 97.6°F | Resp 16 | Ht 68.5 in | Wt 235.0 lb

## 2014-02-01 DIAGNOSIS — E7800 Pure hypercholesterolemia, unspecified: Secondary | ICD-10-CM

## 2014-02-01 DIAGNOSIS — R7302 Impaired glucose tolerance (oral): Secondary | ICD-10-CM

## 2014-02-01 DIAGNOSIS — IMO0001 Reserved for inherently not codable concepts without codable children: Secondary | ICD-10-CM

## 2014-02-01 DIAGNOSIS — Z23 Encounter for immunization: Secondary | ICD-10-CM

## 2014-02-01 DIAGNOSIS — E041 Nontoxic single thyroid nodule: Secondary | ICD-10-CM

## 2014-02-01 DIAGNOSIS — R030 Elevated blood-pressure reading, without diagnosis of hypertension: Secondary | ICD-10-CM

## 2014-02-01 LAB — POCT GLYCOSYLATED HEMOGLOBIN (HGB A1C): Hemoglobin A1C: 6.9

## 2014-02-01 LAB — GLUCOSE, POCT (MANUAL RESULT ENTRY): POC GLUCOSE: 114 mg/dL — AB (ref 70–99)

## 2014-02-01 NOTE — Progress Notes (Signed)
Subjective:    Patient ID: Yvonne Fisher, female    DOB: Jan 26, 1976, 38 y.o.   MRN: [id number] This chart was scribed for Reginia Forts, MD by Marti Sleigh, Medical Scribe. This patient was seen in Room 23 and the patient's care was started at 3:52 PM.  HPI HPI Comments: Yvonne Fisher is a 38 y.o. female with a hx of Gestational DM, Gestational HTN, HLD and recent thyroid nodule surgery who presents to [facility] for her regular three month checkup. Pt states that she is recovering well after thyroid nodule surgery. Pt states she gave birth on May 20th, 2015. Pt states her blood sugar has been running 137 or below after eating. Pt states she has gained 4 pounds since her last checkup in July, but she feels she will be able to reduce her weight once she returns to work. She has been out of work since thyroid nodule resection.  Pathology benign.   Pt received both her flu shot and tetanus within the last three months. Pt sees her surgeon on the 15th of October to determine her eligibility to return to work. Pt denies choking, CP, dizziness, HA, SOB, cough, or ankle swelling. Pt states she is swallowing without any issues.  She has not been monitoring blood pressure since last visit but several readings were obtained during surgery for thyroid nodule.  She admits to stress eating since undergoing thyroid nodule work up.  She has not been monitoring diet as closely.    Review of Systems  Constitutional: Negative for fever, chills, diaphoresis and fatigue.  HENT: Positive for voice change. Negative for trouble swallowing.   Eyes: Negative for visual disturbance.  Respiratory: Negative for cough, choking and shortness of breath.   Cardiovascular: Negative for chest pain, palpitations and leg swelling.  Gastrointestinal: Negative for nausea, vomiting, abdominal pain, diarrhea and constipation.  Endocrine: Negative for cold intolerance, heat intolerance, polydipsia, polyphagia and polyuria.  Genitourinary:  Negative for dysuria and difficulty urinating.  Skin: Negative for color change.       Wound from surgey healing well.  Neurological: Negative for dizziness, tremors, seizures, syncope, facial asymmetry, speech difficulty, weakness, light-headedness, numbness and headaches.  Psychiatric/Behavioral: The patient is not nervous/anxious.        Objective:   Physical Exam  Constitutional: She is oriented to person, place, and time. She appears well-developed and well-nourished. No distress.  HENT:  Head: Normocephalic and atraumatic.  Right Ear: External ear normal.  Left Ear: External ear normal.  Nose: Nose normal.  Mouth/Throat: Oropharynx is clear and moist. No oropharyngeal exudate.  Hoarseness with talking.  Eyes: Conjunctivae and EOM are normal. Pupils are equal, round, and reactive to light.  Neck: Normal range of motion. Neck supple. Carotid bruit is not present. No tracheal deviation present. No thyromegaly present.  Cardiovascular: Normal rate, regular rhythm, normal heart sounds and intact distal pulses.  Exam reveals no gallop and no friction rub.   No murmur heard. Pulmonary/Chest: Effort normal and breath sounds normal. She has no wheezes. She has no rales.  Abdominal: Soft. Bowel sounds are normal. She exhibits no distension and no mass. There is no tenderness. There is no rebound and no guarding.  Lymphadenopathy:    She has no cervical adenopathy.  Neurological: She is alert and oriented to person, place, and time. No cranial nerve deficit.  Skin: Skin is warm and dry. No rash noted. She is not diaphoretic. No erythema. No pallor.  Well-healed horizontal incision anterior neck.  Psychiatric: She has a normal mood and affect. Her behavior is normal.   Results for orders placed in visit on 02/01/14  GLUCOSE, POCT (MANUAL RESULT ENTRY)      Result Value Ref Range   POC Glucose 114 (*) 70 - 99 mg/dl  POCT GLYCOSYLATED HEMOGLOBIN (HGB A1C)      Result Value Ref Range    Hemoglobin A1C 6.9      INFLUENZA VACCINE ADMINISTERED IN OFFICE.    Assessment & Plan:  Need for prophylactic vaccination and inoculation against influenza - Plan: Flu Vaccine QUAD 36+ mos IM  Glucose intolerance (impaired glucose tolerance) - Plan: POCT glucose (manual entry), POCT glycosylated hemoglobin (Hb A1C)  Thyroid nodule  Blood pressure elevated  Hypercholesterolemia   1. DMII: worsening since last visit; recommend weight loss, exercise, low-carbohydrate and low-sugar food choices. Pt declined medication; desires three month trial of dietary modification. 2.  Thyroid nodule: New.  S/p resection with benign pathology; follow-up with general surgery in upcoming two weeks; has been out of work. 3.  Blood pressure elevated: persistent; non-compliant with exercise, dietary modification since last visit; plans to work aggressively on weight loss, exercise, and dietary modification. 4.  Hypercholesterolemia: New.  Recommend weight loss, exercise, low-cholesterol food choices. 5.  S/p flu vaccine.   No orders of the defined types were placed in this encounter.   I personally performed the services described in this documentation, which was scribed in my presence.  The recorded information has been reviewed and is accurate.  Reginia Forts, M.D.  [facility] [address] [place], [place]  [address] [phone] phone [phone] fax

## 2014-02-01 NOTE — Patient Instructions (Addendum)
Influenza Vaccine (Flu Vaccine, Inactivated or Recombinant) 2014-2015: What You Need to Know 1. Why get vaccinated? Influenza ("flu") is a contagious disease that spreads around the [place] every winter, usually between October and May. Flu is caused by influenza viruses, and is spread mainly by coughing, sneezing, and close contact. Anyone can get flu, but the risk of getting flu is highest among children. Symptoms come on suddenly and may last several days. They can include:  fever/chills  sore throat  muscle aches  fatigue  cough  headache  runny or stuffy nose Flu can make some people much sicker than others. These people include young children, people 65 and older, pregnant women, and people with certain health conditions-such as heart, lung or kidney disease, nervous system disorders, or a weakened immune system. Flu vaccination is especially important for these people, and anyone in close contact with them. Flu can also lead to pneumonia, and make existing medical conditions worse. It can cause diarrhea and seizures in children. Each year thousands of people in the [place] die from flu, and many more are hospitalized. Flu vaccine is the best protection against flu and its complications. Flu vaccine also helps prevent spreading flu from person to person. 2. Inactivated and recombinant flu vaccines You are getting an injectable flu vaccine, which is either an "inactivated" or "recombinant" vaccine. These vaccines do not contain any live influenza virus. They are given by injection with a needle, and often called the "flu shot."  A different live, attenuated (weakened) influenza vaccine is sprayed into the nostrils. This vaccine is described in a separate Vaccine Information Statement. Flu vaccination is recommended every year. Some children 6 months through 8 years of age might need two doses during one year. Flu viruses are always changing. Each year's flu vaccine is made  to protect against 3 or 4 viruses that are likely to cause disease that year. Flu vaccine cannot prevent all cases of flu, but it is the best defense against the disease.  It takes about 2 weeks for protection to develop after the vaccination, and protection lasts several months to a year. Some illnesses that are not caused by influenza virus are often mistaken for flu. Flu vaccine will not prevent these illnesses. It can only prevent influenza. Some inactivated flu vaccine contains a very small amount of a mercury-based preservative called thimerosal. Studies have shown that thimerosal in vaccines is not harmful, but flu vaccines that do not contain a preservative are available. 3. Some people should not get this vaccine Tell the person who gives you the vaccine:  If you have any severe, life-threatening allergies. If you ever had a life-threatening allergic reaction after a dose of flu vaccine, or have a severe allergy to any part of this vaccine, including (for example) an allergy to gelatin, antibiotics, or eggs, you may be advised not to get vaccinated. Most, but not all, types of flu vaccine contain a small amount of egg protein.  If you ever had Guillain-Barr Syndrome (a severe paralyzing illness, also called GBS). Some people with a history of GBS should not get this vaccine. This should be discussed with your doctor.  If you are not feeling well. It is usually okay to get flu vaccine when you have a mild illness, but you might be advised to wait until you feel better. You should come back when you are better. 4. Risks of a vaccine reaction With a vaccine, like any medicine, there is a chance of side   effects. These are usually mild and go away on their own. Problems that could happen after any vaccine:  Brief fainting spells can happen after any medical procedure, including vaccination. Sitting or lying down for about 15 minutes can help prevent fainting, and injuries caused by a fall. Tell  your doctor if you feel dizzy, or have vision changes or ringing in the ears.  Severe shoulder pain and reduced range of motion in the arm where a shot was given can happen, very rarely, after a vaccination.  Severe allergic reactions from a vaccine are very rare, estimated at less than 1 in a million doses. If one were to occur, it would usually be within a few minutes to a few hours after the vaccination. Mild problems following inactivated flu vaccine:  soreness, redness, or swelling where the shot was given  hoarseness  sore, red or itchy eyes  cough  fever  aches  headache  itching  fatigue If these problems occur, they usually begin soon after the shot and last 1 or 2 days. Moderate problems following inactivated flu vaccine:  Young children who get inactivated flu vaccine and pneumococcal vaccine (PCV13) at the same time may be at increased risk for seizures caused by fever. Ask your doctor for more information. Tell your doctor if a child who is getting flu vaccine has ever had a seizure. Inactivated flu vaccine does not contain live flu virus, so you cannot get the flu from this vaccine. As with any medicine, there is a very remote chance of a vaccine causing a serious injury or death. The safety of vaccines is always being monitored. For more information, visit: www.cdc.gov/vaccinesafety/ 5. What if there is a serious reaction? What should I look for?  Look for anything that concerns you, such as signs of a severe allergic reaction, very high fever, or behavior changes. Signs of a severe allergic reaction can include hives, swelling of the face and throat, difficulty breathing, a fast heartbeat, dizziness, and weakness. These would start a few minutes to a few hours after the vaccination. What should I do?  If you think it is a severe allergic reaction or other emergency that can't wait, call 9-1-1 and get the person to the nearest hospital. Otherwise, call your  doctor.  Afterward, the reaction should be reported to the Vaccine Adverse Event Reporting System (VAERS). Your doctor should file this report, or you can do it yourself through the VAERS web site at www.vaers.hhs.gov, or by calling 1-800-822-7967. VAERS does not give medical advice. 6. The National Vaccine Injury Compensation Program The National Vaccine Injury Compensation Program (VICP) is a federal program that was created to compensate people who may have been injured by certain vaccines. Persons who believe they may have been injured by a vaccine can learn about the program and about filing a claim by calling 1-800-338-2382 or visiting the VICP website at www.hrsa.gov/vaccinecompensation. There is a time limit to file a claim for compensation. 7. How can I learn more?  Ask your health care provider.  Call your local or state health department.  Contact the Centers for Disease Control and Prevention (CDC):  Call 1-800-232-4636 (1-800-CDC-INFO) or  Visit CDC's website at www.cdc.gov/flu CDC Vaccine Information Statement (Interim) Inactivated Influenza Vaccine (12/14/2012) Document Released: 02/06/2006 Document Revised: 08/29/2013 Document Reviewed: 04/01/2013 ExitCare Patient Information 2015 [employer]. This information is not intended to replace advice given to you by your health care provider. Make sure you discuss any questions you have with your health   care provider.  Diabetes Mellitus and Food It is important for you to manage your blood sugar (glucose) level. Your blood glucose level can be greatly affected by what you eat. Eating healthier foods in the appropriate amounts throughout the day at about the same time each day will help you control your blood glucose level. It can also help slow or prevent worsening of your diabetes mellitus. Healthy eating may even help you improve the level of your blood pressure and reach or maintain a healthy weight.  HOW CAN FOOD AFFECT  ME? Carbohydrates Carbohydrates affect your blood glucose level more than any other type of food. Your dietitian will help you determine how many carbohydrates to eat at each meal and teach you how to count carbohydrates. Counting carbohydrates is important to keep your blood glucose at a healthy level, especially if you are using insulin or taking certain medicines for diabetes mellitus. Alcohol Alcohol can cause sudden decreases in blood glucose (hypoglycemia), especially if you use insulin or take certain medicines for diabetes mellitus. Hypoglycemia can be a life-threatening condition. Symptoms of hypoglycemia (sleepiness, dizziness, and disorientation) are similar to symptoms of having too much alcohol.  If your health care provider has given you approval to drink alcohol, do so in moderation and use the following guidelines:  Women should not have more than one drink per day, and men should not have more than two drinks per day. One drink is equal to:  12 oz of beer.  5 oz of wine.  1 oz of hard liquor.  Do not drink on an empty stomach.  Keep yourself hydrated. Have water, diet soda, or unsweetened iced tea.  Regular soda, juice, and other mixers might contain a lot of carbohydrates and should be counted. WHAT FOODS ARE NOT RECOMMENDED? As you make food choices, it is important to remember that all foods are not the same. Some foods have fewer nutrients per serving than other foods, even though they might have the same number of calories or carbohydrates. It is difficult to get your body what it needs when you eat foods with fewer nutrients. Examples of foods that you should avoid that are high in calories and carbohydrates but low in nutrients include:  Trans fats (most processed foods list trans fats on the Nutrition Facts label).  Regular soda.  Juice.  Candy.  Sweets, such as cake, pie, doughnuts, and cookies.  Fried foods. WHAT FOODS CAN I EAT? Have nutrient-rich  foods, which will nourish your body and keep you healthy. The food you should eat also will depend on several factors, including:  The calories you need.  The medicines you take.  Your weight.  Your blood glucose level.  Your blood pressure level.  Your cholesterol level. You also should eat a variety of foods, including:  Protein, such as meat, poultry, fish, tofu, nuts, and seeds (lean animal proteins are best).  Fruits.  Vegetables.  Dairy products, such as milk, cheese, and yogurt (low fat is best).  Breads, grains, pasta, cereal, rice, and beans.  Fats such as olive oil, trans fat-free margarine, canola oil, avocado, and olives. DOES EVERYONE WITH DIABETES MELLITUS HAVE THE SAME MEAL PLAN? Because every person with diabetes mellitus is different, there is not one meal plan that works for everyone. It is very important that you meet with a dietitian who will help you create a meal plan that is just right for you. Document Released: 01/09/2005 Document Revised: 04/19/2013 Document Reviewed: 03/11/2013 ExitCare Patient Information  2015 [employer]. This information is not intended to replace advice given to you by your health care provider. Make sure you discuss any questions you have with your health care provider.  

## 2014-02-27 ENCOUNTER — Encounter: Payer: Self-pay | Admitting: Family Medicine

## 2014-05-03 ENCOUNTER — Ambulatory Visit (INDEPENDENT_AMBULATORY_CARE_PROVIDER_SITE_OTHER): Payer: 59 | Admitting: Family Medicine

## 2014-05-03 ENCOUNTER — Encounter: Payer: Self-pay | Admitting: Family Medicine

## 2014-05-03 VITALS — BP 138/82 | HR 93 | Temp 98.1°F | Resp 18 | Ht 69.0 in | Wt 236.0 lb

## 2014-05-03 DIAGNOSIS — R030 Elevated blood-pressure reading, without diagnosis of hypertension: Secondary | ICD-10-CM

## 2014-05-03 DIAGNOSIS — R7309 Other abnormal glucose: Secondary | ICD-10-CM

## 2014-05-03 DIAGNOSIS — E785 Hyperlipidemia, unspecified: Secondary | ICD-10-CM

## 2014-05-03 DIAGNOSIS — IMO0001 Reserved for inherently not codable concepts without codable children: Secondary | ICD-10-CM

## 2014-05-03 LAB — LIPID PANEL
CHOL/HDL RATIO: 2.8 ratio
Cholesterol: 170 mg/dL (ref 0–200)
HDL: 61 mg/dL (ref >39)
LDL CALC: 100 mg/dL — AB (ref 0–99)
TRIGLYCERIDES: 47 mg/dL (ref <150)
VLDL: 9 mg/dL (ref 0–40)

## 2014-05-03 LAB — CBC WITH DIFFERENTIAL/PLATELET
Basophils Absolute: 0 10*3/uL (ref 0.0–0.1)
Basophils Relative: 0 % (ref 0–1)
Eosinophils Absolute: 0.1 10*3/uL (ref 0.0–0.7)
Eosinophils Relative: 2 % (ref 0–5)
HCT: 38.6 % (ref 36.0–46.0)
HEMOGLOBIN: 12.9 g/dL (ref 12.0–15.0)
Lymphocytes Relative: 46 % (ref 12–46)
Lymphs Abs: 3.3 10*3/uL (ref 0.7–4.0)
MCH: 27.9 pg (ref 26.0–34.0)
MCHC: 33.4 g/dL (ref 30.0–36.0)
MCV: 83.4 fL (ref 78.0–100.0)
MONO ABS: 0.5 10*3/uL (ref 0.1–1.0)
MPV: 9.8 fL (ref 8.6–12.4)
Monocytes Relative: 7 % (ref 3–12)
NEUTROS PCT: 45 % (ref 43–77)
Neutro Abs: 3.2 10*3/uL (ref 1.7–7.7)
PLATELETS: 300 10*3/uL (ref 150–400)
RBC: 4.63 MIL/uL (ref 3.87–5.11)
RDW: 15.5 % (ref 11.5–15.5)
WBC: 7.2 10*3/uL (ref 4.0–10.5)

## 2014-05-03 LAB — COMPREHENSIVE METABOLIC PANEL
ALK PHOS: 83 U/L (ref 39–117)
ALT: 36 U/L — ABNORMAL HIGH (ref 0–35)
AST: 19 U/L (ref 0–37)
Albumin: 4 g/dL (ref 3.5–5.2)
BILIRUBIN TOTAL: 0.6 mg/dL (ref 0.2–1.2)
BUN: 13 mg/dL (ref 6–23)
CO2: 24 mEq/L (ref 19–32)
CREATININE: 0.69 mg/dL (ref 0.50–1.10)
Calcium: 8.9 mg/dL (ref 8.4–10.5)
Chloride: 102 mEq/L (ref 96–112)
Glucose, Bld: 91 mg/dL (ref 70–99)
Potassium: 3.8 mEq/L (ref 3.5–5.3)
SODIUM: 136 meq/L (ref 135–145)
TOTAL PROTEIN: 6.9 g/dL (ref 6.0–8.3)

## 2014-05-03 LAB — POCT GLYCOSYLATED HEMOGLOBIN (HGB A1C): Hemoglobin A1C: 6.5

## 2014-05-03 NOTE — Progress Notes (Signed)
Subjective:    Patient ID: Yvonne Fisher, female    DOB: [birth year], 39 y.o.   MRN: [id number]  PCP: Reginia Forts, MD  Chief Complaint  Patient presents with  . Follow-up    Glucose   Patient Active Problem List   Diagnosis Date Noted  . Thyroid nodule 01/18/2014  . PIH (pregnancy induced hypertension) 09/14/2013  . NSVD (normal spontaneous vaginal delivery) 09/14/2013  . Hidradenitis axillaris 11/21/2011  . Chronic cholecystitis with calculus 04/15/2011   Prior to Admission medications   Not on File   Medications, allergies, past medical history, surgical history, family history, social history and problem list reviewed and updated.  HPI  49 yof with pmh elevated bg, elevated cho presents for 3 month f/u.   Last seen by [place] 02/01/14. She had recently had left thyroid lobectomy on 9/23. She had a new baby and has an 16 yr old. A1C drawn at that appt was 6.9, up from 5.9 at prev visit 3 months earlier. Lipids were also elevated with ldl 148. She declined any medication at that time and decided on diet/exercise.  Today she states she has not been successful with this. She has an 41 yo and a 73 month old baby. She is still recovering from thyroid surgery. She is working but has had lot of financial stressors with car breaking down, home repairs, and holiday gifts for the kids.   She has not been able to exercise at all. She has not really been able to watch her diet much. She has a glucometer but has not checked her bg at all at home. She is not drinking any sugary drinks. She is rarely eating any snacks.  Avg meals: B: Waffles with sugar free syrup, eggs, [place] sausage, oatmeal L: Greens, dip, PBJ sandwich D: Varies, sometimes breakfast for dinner S: Rare snacks, occasional granola  She is recovering from her thyroid procedure. She has had a scratchy voice since the surgery. They are hoping this will improve. She is seeing ENT this spring for f/u of this with possible procedure  if scratchy voice persists. Denies any trouble breathing, swallowing, no hypo/hyper sx.   Review of Systems  Constitutional: Negative for fever, chills, diaphoresis and fatigue.  HENT: Positive for voice change.   Eyes: Negative for visual disturbance.  Respiratory: Negative for cough and shortness of breath.   Cardiovascular: Negative for chest pain, palpitations and leg swelling.  Gastrointestinal: Negative for nausea, vomiting, abdominal pain, diarrhea and constipation.  Endocrine: Negative for cold intolerance, heat intolerance, polydipsia, polyphagia and polyuria.  Skin: Negative for color change, rash and wound.  Neurological: Negative for dizziness, tremors, seizures, syncope, facial asymmetry, speech difficulty, weakness, light-headedness, numbness and headaches.  Psychiatric/Behavioral: Negative for dysphoric mood. The patient is not nervous/anxious.    No CP, SOB, fever, chills. No dysuria, no abd pain, diarrhea, constipation.     Objective:   Physical Exam  Constitutional: She is oriented to person, place, and time. She appears well-developed and well-nourished.  Non-toxic appearance. She does not have a sickly appearance. She does not appear ill. No distress.  BP 138/82 mmHg  Pulse 93  Temp(Src) 98.1 F (36.7 C) (Oral)  Resp 18  Ht 5\' 9"  (1.753 m)  Wt 236 lb (107.049 kg)  BMI 34.84 kg/m2  SpO2 99%  LMP 03/30/2014   HENT:  Head: Normocephalic and atraumatic.  Right Ear: External ear normal.  Left Ear: External ear normal.  Nose: Nose normal.  Mouth/Throat: Oropharynx is clear and  moist.  Eyes: Conjunctivae and EOM are normal. Pupils are equal, round, and reactive to light.  Neck: Normal range of motion. Neck supple. Carotid bruit is not present. No thyromegaly present.  Well healed scar on neck. No thyromegaly or nodules.   Cardiovascular: Normal rate, regular rhythm, normal heart sounds and intact distal pulses.  Exam reveals no gallop and no friction rub.   No  murmur heard. Pulses:      Dorsalis pedis pulses are 2+ on the right side, and 2+ on the left side.       Posterior tibial pulses are 2+ on the right side, and 2+ on the left side.  Pulmonary/Chest: Effort normal and breath sounds normal. She has no decreased breath sounds. She has no wheezes. She has no rhonchi. She has no rales.  Abdominal: Soft. Bowel sounds are normal. She exhibits no distension and no mass. There is no tenderness. There is no rebound and no guarding.  Lymphadenopathy:    She has no cervical adenopathy.  Neurological: She is alert and oriented to person, place, and time. No cranial nerve deficit or sensory deficit.  Skin: Skin is warm and dry. No rash noted. She is not diaphoretic. No erythema. No pallor.  Psychiatric: She has a normal mood and affect. Her speech is normal and behavior is normal.   Results for orders placed or performed in visit on 05/03/14  POCT glycosylated hemoglobin (Hb A1C)  Result Value Ref Range   Hemoglobin A1C 6.5       Assessment & Plan:   32 yof with pmh elevated bg, elevated cho presents for 3 month f/u.   1. Hyperlipidemia -Uncontrolled. S/p six month trial of diet, exercise, weight loss. Repeat today. - Lipid panel  2. Elevated glucose -Improved; continue with additional three month trial of dietary modification, exercise, weight loss.   - POCT glycosylated hemoglobin (Hb A1C) - CBC with Differential - Comprehensive metabolic panel  3. Blood pressure elevated -Stable; borderline readings; continue with weight loss, exercise, dietary modification.   No orders of the defined types were placed in this encounter.     Kristi Elayne Guerin, M.D. [facility] [address] [place], [place]  [address] [phone] phone [phone] fax

## 2014-05-03 NOTE — Patient Instructions (Signed)

## 2014-08-02 ENCOUNTER — Encounter: Payer: Self-pay | Admitting: Family Medicine

## 2014-08-02 ENCOUNTER — Ambulatory Visit (INDEPENDENT_AMBULATORY_CARE_PROVIDER_SITE_OTHER): Payer: 59 | Admitting: Family Medicine

## 2014-08-02 VITALS — BP 140/94 | HR 81 | Temp 98.4°F | Resp 16 | Ht 68.0 in | Wt 235.4 lb

## 2014-08-02 DIAGNOSIS — E119 Type 2 diabetes mellitus without complications: Secondary | ICD-10-CM

## 2014-08-02 DIAGNOSIS — R030 Elevated blood-pressure reading, without diagnosis of hypertension: Secondary | ICD-10-CM | POA: Diagnosis not present

## 2014-08-02 DIAGNOSIS — E785 Hyperlipidemia, unspecified: Secondary | ICD-10-CM

## 2014-08-02 DIAGNOSIS — E669 Obesity, unspecified: Secondary | ICD-10-CM

## 2014-08-02 LAB — POCT GLYCOSYLATED HEMOGLOBIN (HGB A1C): Hemoglobin A1C: 6.7

## 2014-08-02 LAB — GLUCOSE, POCT (MANUAL RESULT ENTRY): POC Glucose: 110 mg/dl — AB (ref 70–99)

## 2014-08-02 NOTE — Patient Instructions (Addendum)
https://www.matthews.info/   DASH Eating Plan DASH stands for "Dietary Approaches to Stop Hypertension." The DASH eating plan is a healthy eating plan that has been shown to reduce high blood pressure (hypertension). Additional health benefits may include reducing the risk of type 2 diabetes mellitus, heart disease, and stroke. The DASH eating plan may also help with weight loss. WHAT DO I NEED TO KNOW ABOUT THE DASH EATING PLAN? For the DASH eating plan, you will follow these general guidelines:  Choose foods with a percent daily value for sodium of less than 5% (as listed on the food label).  Use salt-free seasonings or herbs instead of table salt or sea salt.  Check with your health care provider or pharmacist before using salt substitutes.  Eat lower-sodium products, often labeled as "lower sodium" or "no salt added."  Eat fresh foods.  Eat more vegetables, fruits, and low-fat dairy products.  Choose whole grains. Look for the word "whole" as the first word in the ingredient list.  Choose fish and skinless chicken or [place] more often than red meat. Limit fish, poultry, and meat to 6 oz (170 g) each day.  Limit sweets, desserts, sugars, and sugary drinks.  Choose heart-healthy fats.  Limit cheese to 1 oz (28 g) per day.  Eat more home-cooked food and less restaurant, buffet, and fast food.  Limit fried foods.  Cook foods using methods other than frying.  Limit canned vegetables. If you do use them, rinse them well to decrease the sodium.  When eating at a restaurant, ask that your food be prepared with less salt, or no salt if possible. WHAT FOODS CAN I EAT? Seek help from a dietitian for individual calorie needs. Grains Whole grain or whole wheat bread. Brown rice. Whole grain or whole wheat pasta. Quinoa, bulgur, and whole grain cereals. Low-sodium cereals. Corn or whole wheat flour tortillas. Whole grain cornbread. Whole grain crackers. Low-sodium crackers. Vegetables Fresh  or frozen vegetables (raw, steamed, roasted, or grilled). Low-sodium or reduced-sodium tomato and vegetable juices. Low-sodium or reduced-sodium tomato sauce and paste. Low-sodium or reduced-sodium canned vegetables.  Fruits All fresh, canned (in natural juice), or frozen fruits. Meat and Other Protein Products Ground beef (85% or leaner), grass-fed beef, or beef trimmed of fat. Skinless chicken or [place]. Ground chicken or [place]. Pork trimmed of fat. All fish and seafood. Eggs. Dried beans, peas, or lentils. Unsalted nuts and seeds. Unsalted canned beans. Dairy Low-fat dairy products, such as skim or 1% milk, 2% or reduced-fat cheeses, low-fat ricotta or cottage cheese, or plain low-fat yogurt. Low-sodium or reduced-sodium cheeses. Fats and Oils Tub margarines without trans fats. Light or reduced-fat mayonnaise and salad dressings (reduced sodium). Avocado. Safflower, olive, or canola oils. Natural peanut or almond butter. Other Unsalted popcorn and pretzels. The items listed above may not be a complete list of recommended foods or beverages. Contact your dietitian for more options. WHAT FOODS ARE NOT RECOMMENDED? Grains White bread. White pasta. White rice. Refined cornbread. Bagels and croissants. Crackers that contain trans fat. Vegetables Creamed or fried vegetables. Vegetables in a cheese sauce. Regular canned vegetables. Regular canned tomato sauce and paste. Regular tomato and vegetable juices. Fruits Dried fruits. Canned fruit in light or heavy syrup. Fruit juice. Meat and Other Protein Products Fatty cuts of meat. Ribs, chicken wings, bacon, sausage, bologna, salami, chitterlings, fatback, hot dogs, bratwurst, and packaged luncheon meats. Salted nuts and seeds. Canned beans with salt. Dairy Whole or 2% milk, cream, half-and-half, and cream cheese. Whole-fat or  sweetened yogurt. Full-fat cheeses or blue cheese. Nondairy creamers and whipped toppings. Processed cheese, cheese spreads,  or cheese curds. Condiments Onion and garlic salt, seasoned salt, table salt, and sea salt. Canned and packaged gravies. Worcestershire sauce. Tartar sauce. Barbecue sauce. Teriyaki sauce. Soy sauce, including reduced sodium. Steak sauce. Fish sauce. Oyster sauce. Cocktail sauce. Horseradish. Ketchup and mustard. Meat flavorings and tenderizers. Bouillon cubes. Hot sauce. Tabasco sauce. Marinades. Taco seasonings. Relishes. Fats and Oils Butter, stick margarine, lard, shortening, ghee, and bacon fat. Coconut, palm kernel, or palm oils. Regular salad dressings. Other Pickles and olives. Salted popcorn and pretzels. The items listed above may not be a complete list of foods and beverages to avoid. Contact your dietitian for more information. WHERE CAN I FIND MORE INFORMATION? National Heart, Lung, and Blood Institute: travelstabloid.com Document Released: 04/03/2011 Document Revised: 08/29/2013 Document Reviewed: 02/16/2013 [facility] Patient Information 2015 [place], [place]. This information is not intended to replace advice given to you by your health care provider. Make sure you discuss any questions you have with your health care provider.

## 2014-08-02 NOTE — Progress Notes (Signed)
Subjective:    Patient ID: Yvonne Fisher, female    DOB: 01-Mar-1976, 39 y.o.   MRN: [id number]  HPI Yvonne Fisher is a 39 year old African American female here today for a 3 month follow up of hypertension and diabetes.  At the previous visit in January, patient expressed that she did not wish to begin medications for high blood pressure or elevated glucose. She wished to manage both with exercise and diet. These are still her wishes today. She notes that she has cut back on carbohydrates and began walking 2x per week for 20 minutes. She says that she has trouble finding the time to eat because she is very busy with her young children. She also notes that she does not check her blood pressure or blood sugar regularly at home. She also does not check her weight regularly, but notes that her clothes are starting to fit differently and people at work have commented that she looks like she is losing weight.   She has been doing well since thyroidectomy in September 2015. Voice is still not back to baseline, but has improved since last visit in January. Incision site is well-healed.  Review of Systems  Constitutional: Negative for fever, chills, diaphoresis and fatigue.  HENT: Positive for voice change. Negative for congestion, ear pain, rhinorrhea and sinus pressure.   Eyes: Negative for pain and visual disturbance.  Respiratory: Negative for cough and shortness of breath.   Cardiovascular: Negative for chest pain, palpitations and leg swelling.  Gastrointestinal: Negative for nausea, vomiting, abdominal pain, diarrhea and constipation.  Endocrine: Negative for cold intolerance, heat intolerance, polydipsia, polyphagia and polyuria.  Genitourinary: Negative for dysuria, frequency and hematuria.  Musculoskeletal: Negative for myalgias and arthralgias.  Neurological: Negative for dizziness, tremors, seizures, syncope, facial asymmetry, speech difficulty, weakness, light-headedness, numbness and  headaches.       Objective:   Physical Exam  Constitutional: She is oriented to person, place, and time. She appears well-developed and well-nourished. No distress.  HENT:  Head: Normocephalic and atraumatic.  Right Ear: External ear normal.  Left Ear: External ear normal.  Nose: Nose normal.  Mouth/Throat: Oropharynx is clear and moist.  Eyes: Conjunctivae and EOM are normal. Pupils are equal, round, and reactive to light. No scleral icterus.  Neck: Normal range of motion. Neck supple. Carotid bruit is not present. No thyromegaly present.  Well healed incision anterior neck.  Cardiovascular: Normal rate, regular rhythm, normal heart sounds and intact distal pulses.  Exam reveals no gallop and no friction rub.   No murmur heard. Pulses:      Posterior tibial pulses are 2+ on the right side, and 2+ on the left side.  Pulmonary/Chest: Effort normal and breath sounds normal. She has no wheezes. She has no rales.  Abdominal: Soft. Bowel sounds are normal. She exhibits no distension and no mass. There is no tenderness. There is no rebound and no guarding.  Musculoskeletal: She exhibits no edema.  Lymphadenopathy:    She has no cervical adenopathy.  Neurological: She is alert and oriented to person, place, and time. No cranial nerve deficit.  Skin: Skin is warm and dry. No rash noted. She is not diaphoretic. No erythema. No pallor.     Psychiatric: She has a normal mood and affect. Her behavior is normal.      Assessment & Plan:  Yvonne Fisher is a 39 year old African American female here today for 3 month follow up of hypertension and elevated glucose.  She will follow up in 3 months to recheck blood pressure and blood sugar.   1. Hypertension -Blood pressure today was 140/94 mmHg.  -Patient does not wish to be started on any medications at this time. She continues to want to adhere to exercise and diet for management.  -Explained the importance of needing to lose weight. Set goal of  losing 5 lbs by next visit in 3 months.  -Also discussed importance of dietary compliance and decreasing sodium intake.   2. Elevated blood glucose, Type 2 Diabetes Mellitus -Blood sugar check today was 110. HgbA1c was 6.7, which is increased from 6.5 at last visit. -Discussed with patient the importance of diet compliance and adherence.  -Discussed potential of seeing a dietician to help with dietary options. -Told patient about https://www.matthews.info/ and helped her set up the account on her phone. Told her to use this app to assist with keeping track of calorie intake and exercise. -Discussed with patient that if blood glucose was not improved by the next visit, we would need to start medications.   3. Hypercholesterolemia -Cholesterol has decreased from 237 (6 months ago) to 170 (3 months ago).  -Trending in the right direction. Will continue to monitor.   Yvonne Fisher, M.D. [facility] [address] [place], [place]  [address] [phone] phone [phone] fax

## 2015-02-12 ENCOUNTER — Ambulatory Visit (INDEPENDENT_AMBULATORY_CARE_PROVIDER_SITE_OTHER): Payer: 59 | Admitting: Family Medicine

## 2015-02-12 ENCOUNTER — Encounter: Payer: Self-pay | Admitting: Family Medicine

## 2015-02-12 VITALS — BP 117/90 | HR 120 | Temp 98.4°F | Resp 16 | Wt 228.2 lb

## 2015-02-12 DIAGNOSIS — E860 Dehydration: Secondary | ICD-10-CM

## 2015-02-12 DIAGNOSIS — E785 Hyperlipidemia, unspecified: Secondary | ICD-10-CM

## 2015-02-12 DIAGNOSIS — R030 Elevated blood-pressure reading, without diagnosis of hypertension: Secondary | ICD-10-CM | POA: Diagnosis not present

## 2015-02-12 DIAGNOSIS — E119 Type 2 diabetes mellitus without complications: Secondary | ICD-10-CM

## 2015-02-12 DIAGNOSIS — IMO0001 Reserved for inherently not codable concepts without codable children: Secondary | ICD-10-CM

## 2015-02-12 DIAGNOSIS — A09 Infectious gastroenteritis and colitis, unspecified: Secondary | ICD-10-CM | POA: Diagnosis not present

## 2015-02-12 LAB — HEMOGLOBIN A1C
HEMOGLOBIN A1C: 7.3 % — AB (ref <5.7)
Mean Plasma Glucose: 163 mg/dL — ABNORMAL HIGH (ref <117)

## 2015-02-12 LAB — CBC WITH DIFFERENTIAL/PLATELET
BASOS ABS: 0 10*3/uL (ref 0.0–0.1)
BASOS PCT: 0 % (ref 0–1)
EOS ABS: 0 10*3/uL (ref 0.0–0.7)
Eosinophils Relative: 0 % (ref 0–5)
HCT: 45.2 % (ref 36.0–46.0)
Hemoglobin: 15.2 g/dL — ABNORMAL HIGH (ref 12.0–15.0)
Lymphocytes Relative: 26 % (ref 12–46)
Lymphs Abs: 1.5 10*3/uL (ref 0.7–4.0)
MCH: 28.6 pg (ref 26.0–34.0)
MCHC: 33.6 g/dL (ref 30.0–36.0)
MCV: 85 fL (ref 78.0–100.0)
MPV: 10.2 fL (ref 8.6–12.4)
Monocytes Absolute: 0.2 10*3/uL (ref 0.1–1.0)
Monocytes Relative: 4 % (ref 3–12)
NEUTROS PCT: 70 % (ref 43–77)
Neutro Abs: 4 10*3/uL (ref 1.7–7.7)
PLATELETS: 301 10*3/uL (ref 150–400)
RBC: 5.32 MIL/uL — ABNORMAL HIGH (ref 3.87–5.11)
RDW: 13.7 % (ref 11.5–15.5)
WBC: 5.7 10*3/uL (ref 4.0–10.5)

## 2015-02-12 LAB — LIPID PANEL
Cholesterol: 147 mg/dL (ref 125–200)
HDL: 57 mg/dL (ref >=46)
LDL CALC: 76 mg/dL (ref <130)
Total CHOL/HDL Ratio: 2.6 Ratio (ref <=5.0)
Triglycerides: 71 mg/dL (ref <150)
VLDL: 14 mg/dL (ref <30)

## 2015-02-12 LAB — COMPREHENSIVE METABOLIC PANEL
ALBUMIN: 4.2 g/dL (ref 3.6–5.1)
ALT: 55 U/L — ABNORMAL HIGH (ref 6–29)
AST: 41 U/L — AB (ref 10–30)
Alkaline Phosphatase: 103 U/L (ref 33–115)
BUN: 9 mg/dL (ref 7–25)
CHLORIDE: 102 mmol/L (ref 98–110)
CO2: 22 mmol/L (ref 20–31)
CREATININE: 0.9 mg/dL (ref 0.50–1.10)
Calcium: 9.2 mg/dL (ref 8.6–10.2)
GLUCOSE: 131 mg/dL — AB (ref 65–99)
Potassium: 3.8 mmol/L (ref 3.5–5.3)
Sodium: 136 mmol/L (ref 135–146)
Total Bilirubin: 0.5 mg/dL (ref 0.2–1.2)
Total Protein: 7.9 g/dL (ref 6.1–8.1)

## 2015-02-12 LAB — POCT URINALYSIS DIP (MANUAL ENTRY)
Glucose, UA: NEGATIVE
LEUKOCYTES UA: NEGATIVE
Nitrite, UA: NEGATIVE
PH UA: 5.5
Protein Ur, POC: 100 — AB
Spec Grav, UA: >=1.03
UROBILINOGEN UA: 0.2

## 2015-02-12 MED ORDER — DIPHENOXYLATE-ATROPINE 2.5-0.025 MG PO TABS: 1.0000 | ORAL_TABLET | Freq: Four times a day (QID) | ORAL | Status: DC | PRN

## 2015-02-12 NOTE — Patient Instructions (Signed)

## 2015-02-12 NOTE — Progress Notes (Signed)
Subjective:    Patient ID: Yvonne Fisher, female    DOB: March 13, 1976, 39 y.o.   MRN: [id number]  02/12/2015  Diarrhea; loss of appetite; and chills   HPI This 39 y.o. female presents for evaluation of diarrhea.  No fever but +chills; no sweats.  No n/v.  +diarrhea non-bloody, non-mucous.  Episodes constant all weekend; for past 48 hours.  Numerous stools all day; up all night.  No dizziness. Unable to get fluids; no one brinking liquids.  Trying to eat yesterday; barely eat food.  Drank Gatorade today.  Barely urinating.  No travel.  No foreign travel.  No abx; no camping.  No other family member's sick; son did have diarrhea but had ear infection.  Diarrhea of son's short term.  Tried pepto pismol. No imodium use.  DMII; due for labs; last visit six months ago.  Hyperlipidemia: due for repeat labs.  Last labs six months ago.  Blood pressure elevated: due for repeat check.  Doing well.   Review of Systems  Constitutional: Negative for fever, chills, diaphoresis and fatigue.  HENT: Negative for congestion, ear pain, rhinorrhea and sore throat.   Eyes: Negative for visual disturbance.  Respiratory: Negative for cough and shortness of breath.   Cardiovascular: Negative for chest pain, palpitations and leg swelling.  Gastrointestinal: Positive for diarrhea. Negative for nausea, vomiting, abdominal pain, constipation, blood in stool, abdominal distention, anal bleeding and rectal pain.  Endocrine: Negative for cold intolerance, heat intolerance, polydipsia, polyphagia and polyuria.  Neurological: Negative for dizziness, tremors, seizures, syncope, facial asymmetry, speech difficulty, weakness, light-headedness, numbness and headaches.    Past Medical History  Diagnosis Date  . Arthritis     right elbow   . Hidradenitis 11/2011    left axilla  . Rash 12/01/2011    bilat. axilla  . Gestational diabetes mellitus, antepartum   . Gestational diabetes     diet controlled  . Pregnancy  induced hypertension 2015    "still keeping eye on it" (01/18/2014)  . Thyroid nodule   . GERD (gastroesophageal reflux disease)    Past Surgical History  Procedure Laterality Date  . Elbow arthroscopy  1997    right elbow  . Cholecystectomy  04/15/2011    Procedure: LAPAROSCOPIC CHOLECYSTECTOMY WITH INTRAOPERATIVE CHOLANGIOGRAM;  Surgeon: Adin Hector, MD;  Location: [facility];  Service: General;  Laterality: N/A;  laparoscopic cholecystectomy with cholangiogram  . Hydradenitis excision  12/05/2011    Procedure: EXCISION HYDRADENITIS AXILLA;  Surgeon: Harl Bowie, MD;  Location: [facility];  Service: General;  Laterality: Left;  wide excision hidradenitis left axilla  . Thyroid lobectomy Left 01/18/2014  . Thyroidectomy Left 01/18/2014    Procedure: LEFT THYROID LOBECTOMY;  Surgeon: Coralie Keens, MD;  Location: [facility];  Service: General;  Laterality: Left;   Allergies  Allergen Reactions  . Adhesive [Tape] Other (See Comments)    TEARS SKIN  . Prevacid [Lansoprazole] Hives    Social History   Social History  . Marital Status: Single    Spouse Name: N/A  . Number of Children: N/A  . Years of Education: N/A   Occupational History  . Not on file.   Social History Main Topics  . Smoking status: Never Smoker   . Smokeless tobacco: Never Used  . Alcohol Use: Yes     Comment: 01/18/2014 "might have a drink @ birthday party or holidays; not all the time"  . Drug Use: No  . Sexual Activity: No  Other Topics Concern  . Not on file   Social History Narrative   Marital status: single; dating seriously x 10 years; happy; no abuse.      Children:  (35 yo son, 55 week old boy).      Lives: with 2 sons, boyfriend.      Employment:  [employer] x 15 years      Tobacco: none      Alcohol: none      Drug: none   Family History  Problem Relation Age of Onset  . Hypertension Mother   . Multiple sclerosis Mother   . Diabetes Father   . Heart  disease Father 75    AMI       Objective:    BP 117/90 mmHg  Pulse 120  Temp(Src) 98.4 F (36.9 C) (Oral)  Resp 16  Wt 228 lb 3.2 oz (103.511 kg)  LMP 01/08/2015 Physical Exam  Constitutional: She is oriented to person, place, and time. She appears well-developed and well-nourished. No distress.  HENT:  Head: Normocephalic and atraumatic.  Right Ear: External ear normal.  Left Ear: External ear normal.  Nose: Nose normal.  MM dry.  Eyes: Conjunctivae and EOM are normal. Pupils are equal, round, and reactive to light.  Neck: Normal range of motion. Neck supple. Carotid bruit is not present. No thyromegaly present.  Cardiovascular: Normal rate, regular rhythm, normal heart sounds and intact distal pulses.  Exam reveals no gallop and no friction rub.   No murmur heard. Pulmonary/Chest: Effort normal and breath sounds normal. She has no wheezes. She has no rales.  Abdominal: Soft. Bowel sounds are normal. She exhibits no distension and no mass. There is no tenderness. There is no rebound and no guarding.  Lymphadenopathy:    She has no cervical adenopathy.  Neurological: She is alert and oriented to person, place, and time. No cranial nerve deficit.  Skin: Skin is warm and dry. No rash noted. She is not diaphoretic. No erythema. No pallor.  Psychiatric: She has a normal mood and affect. Her behavior is normal.   PROCEDURE: 1 LITER NS ADMINISTERED; IV PLACEMENT BY Alveta Heimlich, PA-C.     Assessment & Plan:   1. Dehydration   2. Diarrhea of infectious origin   3. Hyperlipidemia   4. Type 2 diabetes mellitus without complication, without long-term current use of insulin ([place])   5. Blood pressure elevated     1. Dehydration: New.  Secondary to diarrhea for past 48 hours; s/p IVF 1 liter in office.  Recommend BRAT diet, aggressive hydration. 2. Diarrhea: New. Obtain labs including stool studies.  BRAT diet, hydration. 3.  DMII: stable; obtain labs.  Dietary  modification. 4.  Hyperlipidemia: stable; obtain labs; weight loss, exercise, low-cholesterol food choices. 5.  Blood pressure elevated: stable.  Continue to monitor.   Orders Placed This Encounter  Procedures  . Stool culture  . Clostridium Difficile by PCR    Order Specific Question:  Is your patient experiencing loose or watery stools (3 or more in 24 hours)?    Answer:  Yes    Order Specific Question:  Has the patient received laxatives in the last 24 hours?    Answer:  No    Order Specific Question:  Has a negative Cdiff test resulted in the last 7 days?    Answer:  No  . Ova and parasite examination  . CBC with Differential/Platelet  . Hemoglobin A1c  . Comprehensive metabolic panel  Order Specific Question:  Has the patient fasted?    Answer:  Yes  . Lipid panel    Order Specific Question:  Has the patient fasted?    Answer:  Yes  . Microalbumin, urine  . POCT urinalysis dipstick   Meds ordered this encounter  Medications  . diphenoxylate-atropine (LOMOTIL) 2.5-0.025 MG tablet    Sig: Take 1 tablet by mouth 4 (four) times daily as needed for diarrhea or loose stools.    Dispense:  30 tablet    Refill:  0    No Follow-up on file.    Luanne Krzyzanowski Elayne Guerin, M.D. [facility] [address] [place], [place]  [address] [phone] phone [phone] fax

## 2015-02-13 LAB — OVA AND PARASITE EXAMINATION: OP: NONE SEEN

## 2015-02-13 LAB — CLOSTRIDIUM DIFFICILE BY PCR: Toxigenic C. Difficile by PCR: NOT DETECTED

## 2015-02-13 LAB — MICROALBUMIN, URINE: Microalb, Ur: 26.6 mg/dL

## 2015-02-16 LAB — STOOL CULTURE

## 2015-02-21 ENCOUNTER — Other Ambulatory Visit: Payer: Self-pay | Admitting: Family Medicine

## 2015-02-22 ENCOUNTER — Telehealth: Payer: Self-pay

## 2015-02-22 NOTE — Telephone Encounter (Signed)
Pt was seen on 10/17 by Dr.Smith. She is under the impression she was going to get a script for Metformin. She would like to use the pharmacy-[facility] on [address] and [address] [place]. Please advise at [phone]

## 2015-02-22 NOTE — Telephone Encounter (Signed)
See notes under refill encounter I sent to Dr Tamala Julian.

## 2015-02-22 NOTE — Telephone Encounter (Signed)
Pt called to check status of Rx for metformin. Dr Tamala Julian, I saw on lab notes that you wanted pt to start this and pended it. Wanted to make sure you wanted reg metformin and not ER (you didn't have ER in notes). Pharm sent RF req for the lomotil for your review.

## 2015-02-23 MED ORDER — METFORMIN HCL 500 MG PO TABS: ORAL_TABLET | ORAL | Status: DC

## 2015-02-24 IMAGING — US US OB COMP LESS 14 WK
1 series · 13 of 19 positions shown · non-contrast
Comparison: None.

CLINICAL DATA: Initial encounter for vaginal bleeding. LMP
12/09/2012 ([DATE] weeks 4 days, 1st trimester). Quantitative beta HCG
pending. By patient report, office ultrasound on 01/28/2013 was
normal.

EXAM:
OBSTETRIC <14 WK ULTRASOUND
TECHNIQUE: Transabdominal ultrasound was performed for evaluation of the
gestation as well as the maternal uterus and adnexal regions.

[Series 1: us ob comp less 14 wks · 19 acquisitions, 13 frames shown]
[im 1/19]
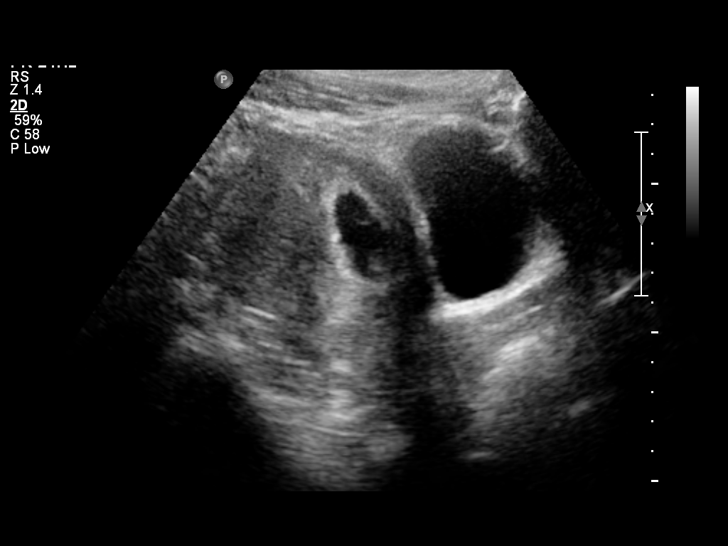
[im 3/19]
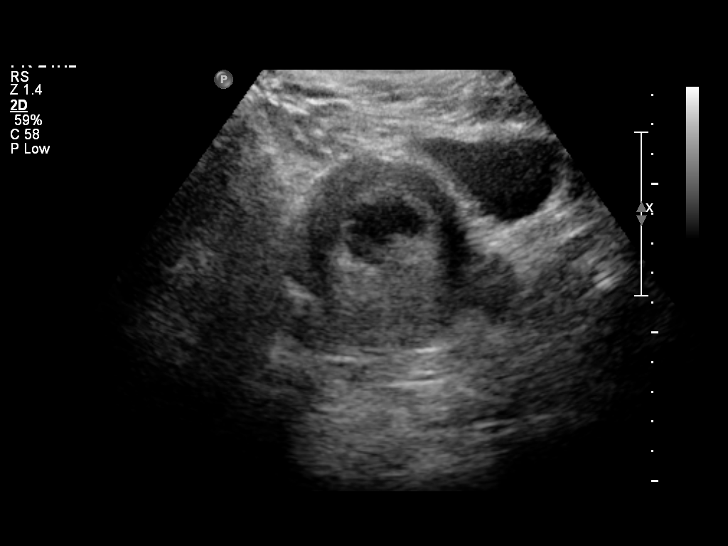
[im 4/19]
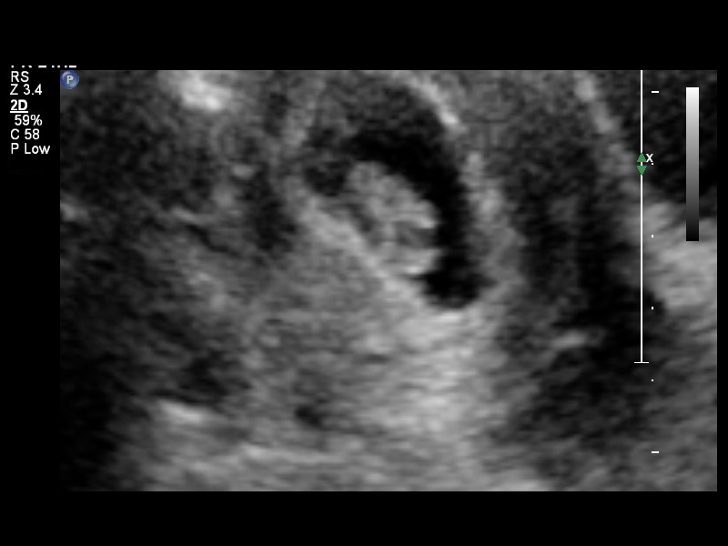
[im 6/19]
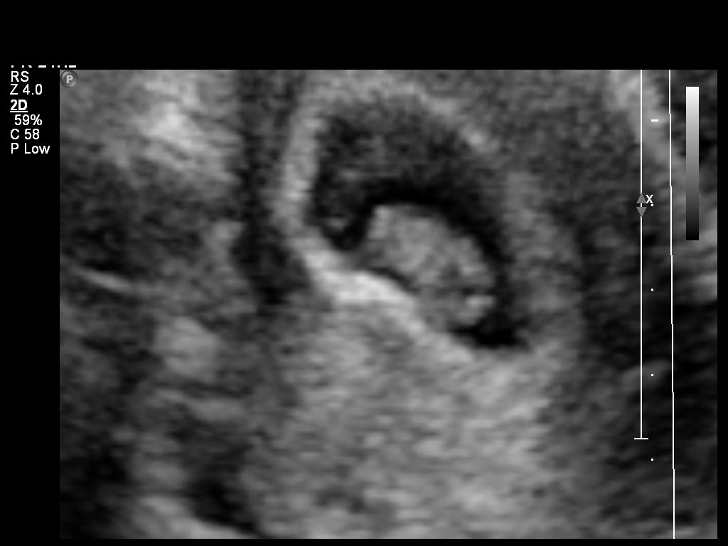
[im 7/19]
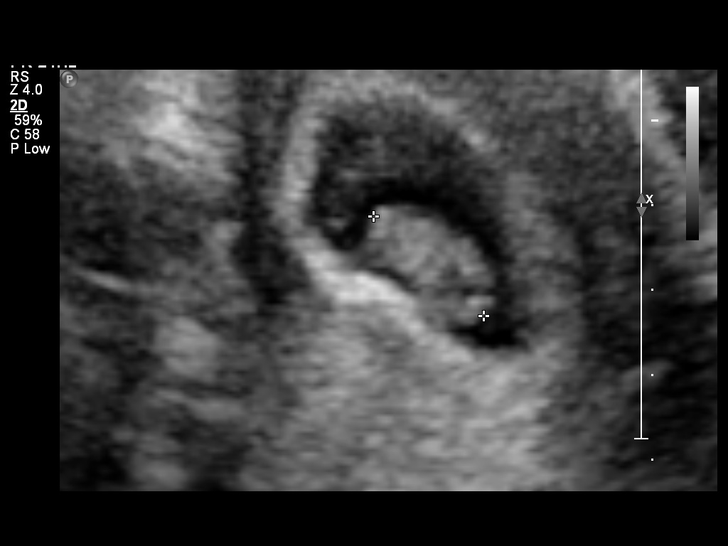
[im 9/19]
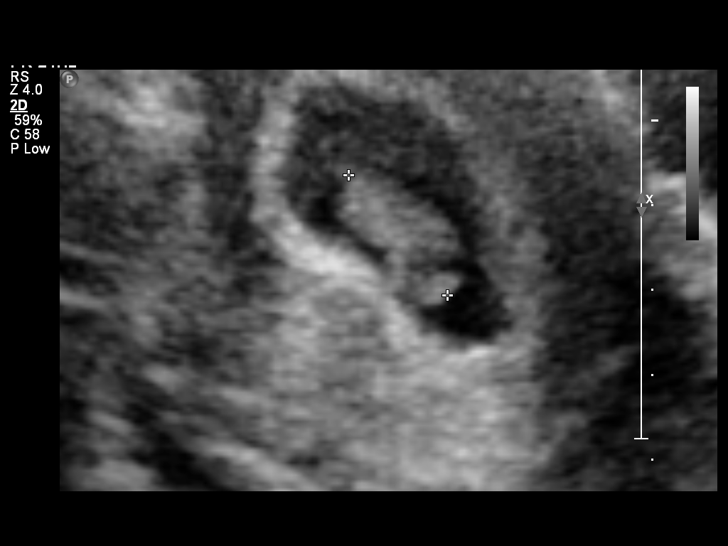
[im 10/19]
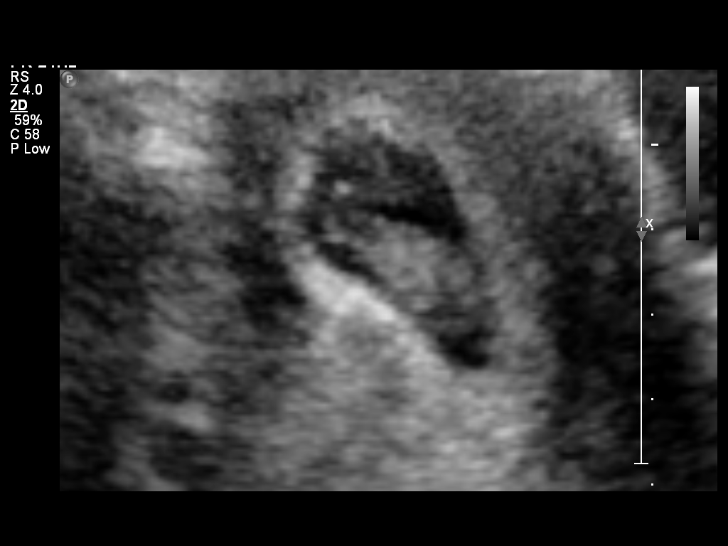
[im 11/19]
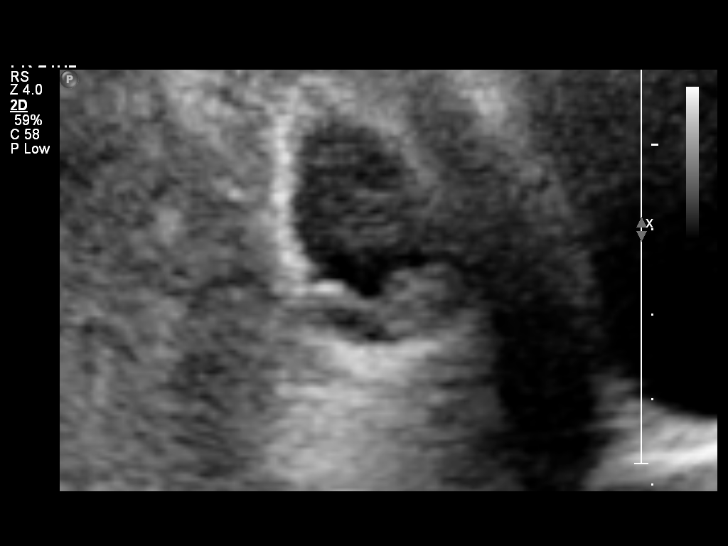
[im 13/19]
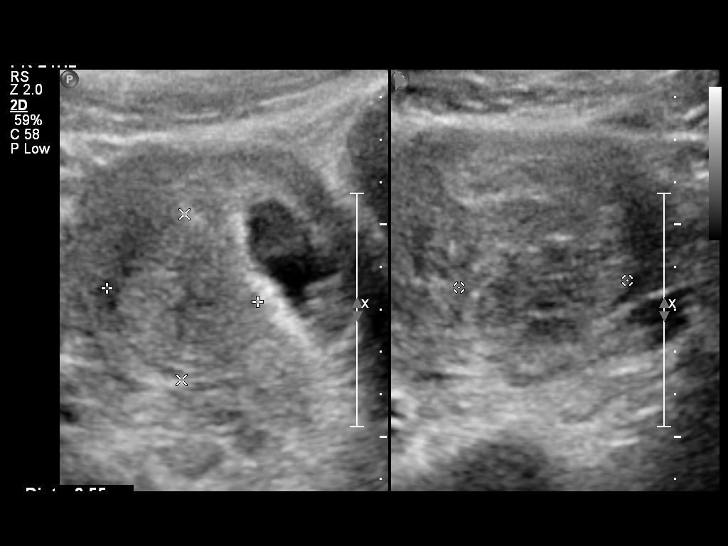
[im 14/19]
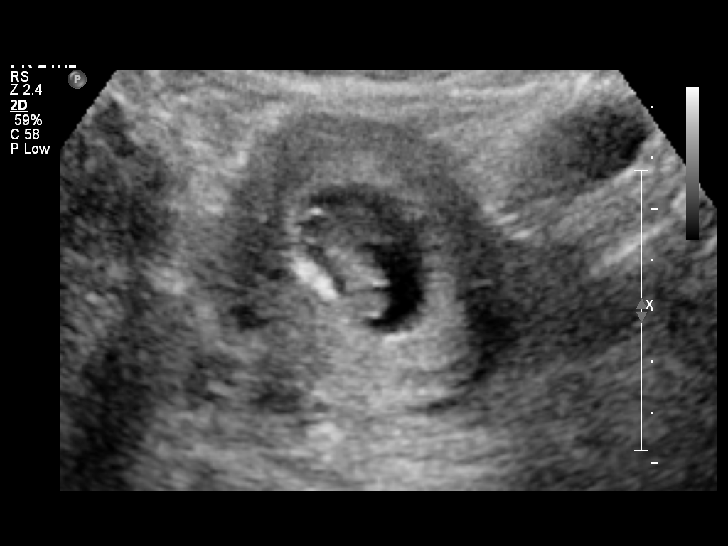
[im 16/19]
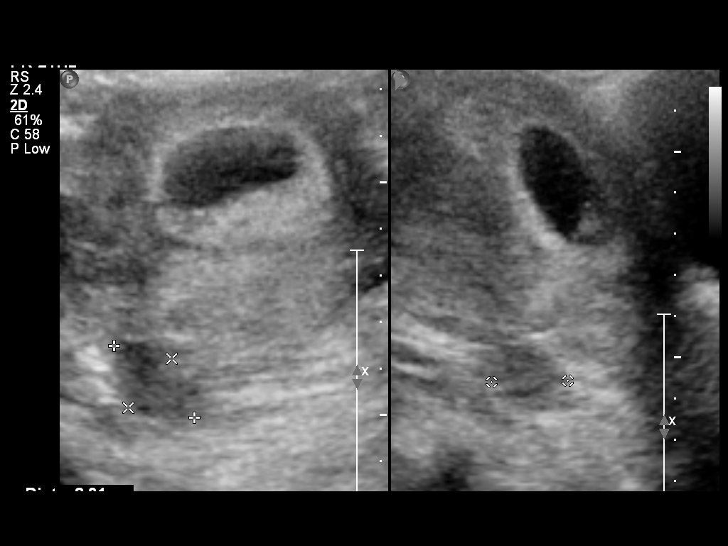
[im 17/19]
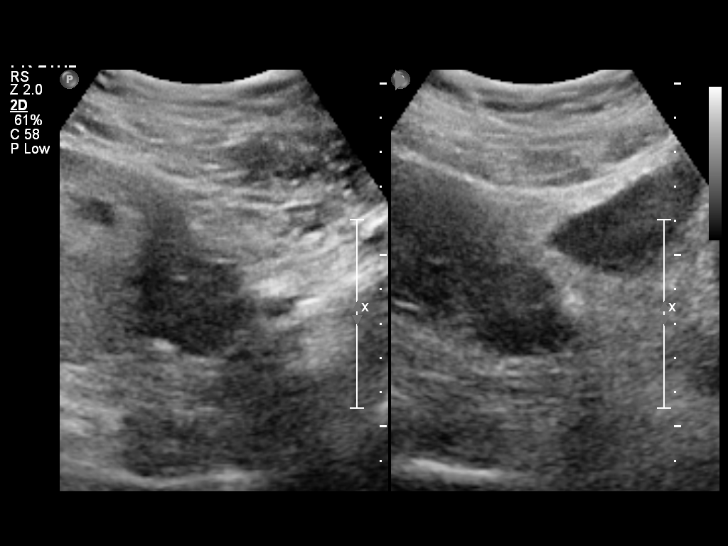
[im 19/19]
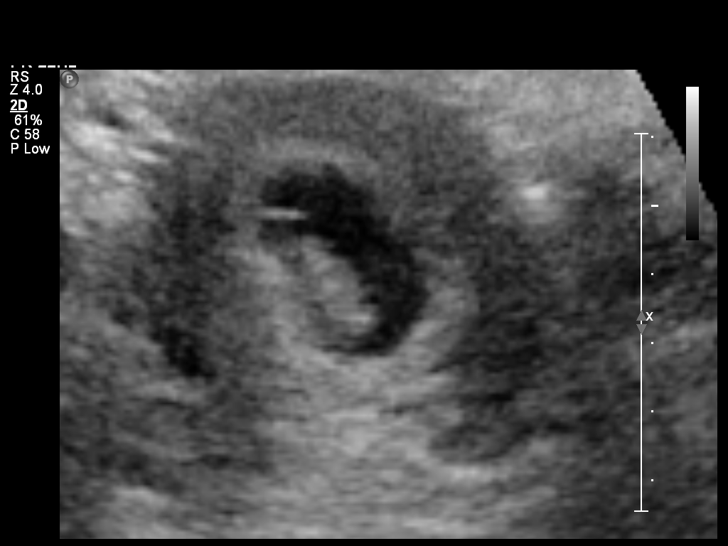

[13 of 19 positions shown; findings below may reference images not displayed]

FINDINGS: Intrauterine gestational sac: Single, normal in shape.

Yolk sac:  Visualized.

Embryo:  Visualized.

Cardiac Activity: Visualized.

Heart Rate: 179 bpm

CRL:   18  mm   8 w 2 d                  US EDC: 09/19/2012.

Maternal uterus/adnexae: No evidence of subchorionic hemorrhage.
Approximate 3.6 x 3.9 x 4.0 cm posterior fundal fibroid immediately
adjacent to the gestational sac, though not deforming the sac
currently. Normal-appearing right ovary measuring approximately
x 1.4 x 1.8 cm. Normal-appearing left ovary measuring approximately
3.4 x 2.8 x 2.5 cm. No adnexal masses. No free pelvic fluid.
IMPRESSION: 1. Single live intrauterine embryo with estimated gestational age 8
weeks 2 days by crown-rump length, correlating well with the
gestational age by LMP of 8 weeks 4 days. Ultrasound EDC 09/19/2012.
2. No evidence of subchorionic hemorrhage.
3. Approximate 4 cm posterior fundal fibroid immediately adjacent to
the gestational sac, not deforming the sac currently.
4. Normal-appearing ovaries. No adnexal masses or free pelvic fluid.

## 2015-02-26 NOTE — Telephone Encounter (Signed)
Called in [place].

## 2015-04-01 ENCOUNTER — Emergency Department (HOSPITAL_COMMUNITY): Payer: No Typology Code available for payment source

## 2015-04-01 ENCOUNTER — Emergency Department (HOSPITAL_COMMUNITY)
Admission: EM | Admit: 2015-04-01 | Discharge: 2015-04-01 | Disposition: A | Discharge status: Home / Self Care | Payer: No Typology Code available for payment source | Attending: Emergency Medicine | Admitting: Emergency Medicine

## 2015-04-01 ENCOUNTER — Encounter (HOSPITAL_COMMUNITY): Payer: Self-pay | Admitting: *Deleted

## 2015-04-01 DIAGNOSIS — Y998 Other external cause status: Secondary | ICD-10-CM | POA: Insufficient documentation

## 2015-04-01 DIAGNOSIS — Z872 Personal history of diseases of the skin and subcutaneous tissue: Secondary | ICD-10-CM | POA: Diagnosis not present

## 2015-04-01 DIAGNOSIS — Z8739 Personal history of other diseases of the musculoskeletal system and connective tissue: Secondary | ICD-10-CM | POA: Insufficient documentation

## 2015-04-01 DIAGNOSIS — Z8719 Personal history of other diseases of the digestive system: Secondary | ICD-10-CM | POA: Insufficient documentation

## 2015-04-01 DIAGNOSIS — Y92410 Unspecified street and highway as the place of occurrence of the external cause: Secondary | ICD-10-CM | POA: Diagnosis not present

## 2015-04-01 DIAGNOSIS — Z7984 Long term (current) use of oral hypoglycemic drugs: Secondary | ICD-10-CM | POA: Diagnosis not present

## 2015-04-01 DIAGNOSIS — S4992XA Unspecified injury of left shoulder and upper arm, initial encounter: Secondary | ICD-10-CM | POA: Diagnosis not present

## 2015-04-01 DIAGNOSIS — Z8632 Personal history of gestational diabetes: Secondary | ICD-10-CM | POA: Diagnosis not present

## 2015-04-01 DIAGNOSIS — S199XXA Unspecified injury of neck, initial encounter: Secondary | ICD-10-CM | POA: Insufficient documentation

## 2015-04-01 DIAGNOSIS — Z8639 Personal history of other endocrine, nutritional and metabolic disease: Secondary | ICD-10-CM | POA: Diagnosis not present

## 2015-04-01 DIAGNOSIS — Y9389 Activity, other specified: Secondary | ICD-10-CM | POA: Insufficient documentation

## 2015-04-01 MED ORDER — LORAZEPAM 1 MG PO TABS
1.0000 mg | ORAL_TABLET | Freq: Once | ORAL | Status: AC
  Administered 2015-04-01: 1 mg via ORAL
  Filled 2015-04-01: qty 1

## 2015-04-01 MED ORDER — IBUPROFEN 800 MG PO TABS
800.0000 mg | ORAL_TABLET | Freq: Once | ORAL | Status: AC
  Administered 2015-04-01: 800 mg via ORAL
  Filled 2015-04-01: qty 1

## 2015-04-01 MED ORDER — IBUPROFEN 600 MG PO TABS: 600.0000 mg | ORAL_TABLET | Freq: Three times a day (TID) | ORAL | Status: DC

## 2015-04-01 MED ORDER — DIAZEPAM 2 MG PO TABS: 2.0000 mg | ORAL_TABLET | Freq: Three times a day (TID) | ORAL | Status: DC

## 2015-04-01 MED ORDER — IBUPROFEN 600 MG PO TABS: 600.0000 mg | ORAL_TABLET | Freq: Three times a day (TID) | ORAL | Status: AC

## 2015-04-01 MED ORDER — DIAZEPAM 2 MG PO TABS: 2.0000 mg | ORAL_TABLET | Freq: Three times a day (TID) | ORAL | Status: AC
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
## 2015-04-01 NOTE — ED Notes (Signed)
C-collar applied

## 2015-04-01 NOTE — ED Provider Notes (Signed)
Patient accepted in sign out from Dr. Vanita Panda pending MRI.  No acute finding on MRI but patient with degenerative disease and osteophyte.  Patient discharged in stable condition.  Harvel Quale, MD 04/01/15 [phone]

## 2015-04-04 ENCOUNTER — Ambulatory Visit (INDEPENDENT_AMBULATORY_CARE_PROVIDER_SITE_OTHER): Payer: 59 | Admitting: Family Medicine

## 2015-04-04 VITALS — BP 134/80 | HR 92 | Temp 98.1°F | Resp 16 | Ht 69.0 in | Wt 236.4 lb

## 2015-04-04 DIAGNOSIS — S29009A Unspecified injury of muscle and tendon of unspecified wall of thorax, initial encounter: Secondary | ICD-10-CM | POA: Diagnosis not present

## 2015-04-04 DIAGNOSIS — Z23 Encounter for immunization: Secondary | ICD-10-CM

## 2015-04-04 DIAGNOSIS — S161XXA Strain of muscle, fascia and tendon at neck level, initial encounter: Secondary | ICD-10-CM

## 2015-04-04 DIAGNOSIS — S29019A Strain of muscle and tendon of unspecified wall of thorax, initial encounter: Secondary | ICD-10-CM

## 2015-04-04 MED ORDER — MELOXICAM 15 MG PO TABS: 15.0000 mg | ORAL_TABLET | Freq: Every day | ORAL | Status: DC

## 2015-04-04 MED ORDER — METHOCARBAMOL 500 MG PO TABS: 500.0000 mg | ORAL_TABLET | Freq: Four times a day (QID) | ORAL | Status: DC | PRN

## 2015-04-04 NOTE — Patient Instructions (Signed)
1.  Stop Ibuprofen at this time. 2.  Stop Valium at this time. 3.  Start Meloxicam and Methocarbamol as prescribed. 4. Take Tylenol as needed for pain. 5. Apply heat to areas twice daily for 15 minutes.
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
## 2015-04-05 ENCOUNTER — Telehealth: Payer: Self-pay

## 2015-04-05 NOTE — Progress Notes (Signed)
Appointment has been made for 12/12 @ 12:30 per Dr. Tamala Julian request.

## 2015-04-05 NOTE — Telephone Encounter (Signed)
Dr. Tamala Julian   Please see previous message.

## 2015-04-05 NOTE — Telephone Encounter (Signed)
I am happy to complete any paperwork needed for patient; she needs to drop off paperwork to me.

## 2015-04-05 NOTE — Telephone Encounter (Signed)
Patient came in and saw Dr. Tamala Julian yesterday and she wrote her out of work to be on light duty but her job doesn't offer light so she needs to have paperwork done for short term disability today. Please call patient and let her know if that's possible. her job won't allow her to come back to work because they have nothing for her to do per the [employer]

## 2015-04-05 NOTE — Telephone Encounter (Signed)
Pt.notified

## 2015-04-05 NOTE — Telephone Encounter (Addendum)
Pt came in tonight to drop off fmla/disabillity forms for Dr. Tamala Julian to complete. The forms were placed in Dr. Thompson Caul box. Pt is also requesting a UPDATED work note. Pt also paid the $15 fee for completing the paperwork.

## 2015-04-09 ENCOUNTER — Encounter: Payer: Self-pay | Admitting: Family Medicine

## 2015-04-09 ENCOUNTER — Ambulatory Visit (INDEPENDENT_AMBULATORY_CARE_PROVIDER_SITE_OTHER): Payer: 59 | Admitting: Family Medicine

## 2015-04-09 VITALS — BP 110/80 | HR 75 | Temp 98.3°F | Resp 16 | Ht 68.0 in | Wt 238.4 lb

## 2015-04-09 DIAGNOSIS — S161XXD Strain of muscle, fascia and tendon at neck level, subsequent encounter: Secondary | ICD-10-CM | POA: Diagnosis not present
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
## 2015-04-10 DIAGNOSIS — Z0271 Encounter for disability determination: Secondary | ICD-10-CM

## 2015-04-16 ENCOUNTER — Ambulatory Visit (INDEPENDENT_AMBULATORY_CARE_PROVIDER_SITE_OTHER): Payer: 59

## 2015-04-16 ENCOUNTER — Encounter: Payer: Self-pay | Admitting: Family Medicine

## 2015-04-16 ENCOUNTER — Ambulatory Visit (INDEPENDENT_AMBULATORY_CARE_PROVIDER_SITE_OTHER): Payer: 59 | Admitting: Family Medicine

## 2015-04-16 VITALS — BP 145/82 | HR 87 | Temp 97.7°F | Resp 14 | Ht 69.0 in | Wt 236.8 lb

## 2015-04-16 DIAGNOSIS — M25512 Pain in left shoulder: Secondary | ICD-10-CM

## 2015-04-16 DIAGNOSIS — S161XXD Strain of muscle, fascia and tendon at neck level, subsequent encounter: Secondary | ICD-10-CM

## 2015-04-16 MED ORDER — PREDNISONE 20 MG PO TABS: ORAL_TABLET | ORAL | Status: DC

## 2015-04-16 NOTE — Patient Instructions (Signed)
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
## 2015-04-19 ENCOUNTER — Encounter: Payer: Self-pay | Admitting: Physical Therapy

## 2015-04-19 ENCOUNTER — Ambulatory Visit: Payer: No Typology Code available for payment source | Attending: Family Medicine | Admitting: Physical Therapy

## 2015-04-19 DIAGNOSIS — M6248 Contracture of muscle, other site: Secondary | ICD-10-CM | POA: Diagnosis present

## 2015-04-19 DIAGNOSIS — M62838 Other muscle spasm: Secondary | ICD-10-CM

## 2015-04-19 DIAGNOSIS — M25512 Pain in left shoulder: Secondary | ICD-10-CM | POA: Diagnosis present

## 2015-04-19 DIAGNOSIS — M542 Cervicalgia: Secondary | ICD-10-CM
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
## 2015-04-19 NOTE — Patient Instructions (Signed)
AROM: Lateral Neck Flexion   Slowly tilt head toward one shoulder, then the other. Hold each position _3__ seconds. Repeat __10__ times per set. Do _2___ sets per session. Do _2___ sessions per day.  AROM: Neck Flexion   Bend head forward. Hold _3___ seconds. Repeat _10___ times per set. Do _2___ sets per session. Do _2___ sessions per day.  Levator Stretch   Grasp seat or sit on hand on side to be stretched. Turn head toward other side and look down. Use hand on head to gently stretch neck in that position. Hold _20___ seconds. Repeat on other side. Repeat __3__ times. Do __2__ sessions per day.  Elevation / Depression   While slowly inhaling, bring shoulders toward ears. Hold _2__ seconds. Slowly exhale while relaxing shoulders backward and downward. Repeat _10__ times. Do _2__ times per day.SCAPULA: Retraction   Hold cane with both hands. Pinch shoulder blades together. Do not shrug shoulders. Hold ___ seconds. Use___ lb weight on cane. ___ reps per set, ___ sets per day, ___ days per week  NECK TENSION: Assisted Stretch   Reach right arm around head and hold slightly above ear. Gently bring right ear toward right shoulder. Hold position for ___ breaths. Repeat with other arm. Repeat ___ times, alternating arms. Do ___ times per day.  Flexibility: Neck Retraction   Pull head straight back, keeping eyes and jaw level. Repeat ____ times per set. Do ____ sets per session. Do ____ sessions per day.  

## 2015-04-24 ENCOUNTER — Ambulatory Visit: Payer: No Typology Code available for payment source | Admitting: Physical Therapy

## 2015-04-24 ENCOUNTER — Encounter: Payer: Self-pay | Admitting: Physical Therapy

## 2015-04-24 DIAGNOSIS — M25512 Pain in left shoulder: Secondary | ICD-10-CM

## 2015-04-24 DIAGNOSIS — M542 Cervicalgia: Secondary | ICD-10-CM

## 2015-04-24 DIAGNOSIS — M62838 Other muscle spasm: Secondary | ICD-10-CM

## 2015-04-24 NOTE — Therapy (Signed)
[place], [place], [address] Phone: [phone]   Fax:  [phone]  Physical Therapy Treatment  Patient Details  Name: Yvonne Fisher MRN: [id number] Date of Birth: 08-16-1975 Referring Provider: Reginia Forts  Encounter Date: 04/24/2015      PT End of Session - 04/24/15 0944    Visit Number 2   Date for PT Re-Evaluation 06/20/15   PT Start Time 0935   PT Stop Time 1020   PT Time Calculation (min) 45 min      Past Medical History  Diagnosis Date  . Arthritis     right elbow   . Hidradenitis 11/2011    left axilla  . Rash 12/01/2011    bilat. axilla  . Pregnancy induced hypertension 2015    "still keeping eye on it" (01/18/2014)  . Thyroid nodule   . GERD (gastroesophageal reflux disease)   . Diabetes ([place])   . Gestational diabetes     diet controlled    Past Surgical History  Procedure Laterality Date  . Elbow arthroscopy  1997    right elbow  . Cholecystectomy  04/15/2011    Procedure: LAPAROSCOPIC CHOLECYSTECTOMY WITH INTRAOPERATIVE CHOLANGIOGRAM;  Surgeon: Adin Hector, MD;  Location: [facility];  Service: General;  Laterality: N/A;  laparoscopic cholecystectomy with cholangiogram  . Hydradenitis excision  12/05/2011    Procedure: EXCISION HYDRADENITIS AXILLA;  Surgeon: Harl Bowie, MD;  Location: [facility];  Service: General;  Laterality: Left;  wide excision hidradenitis left axilla  . Thyroid lobectomy Left 01/18/2014  . Thyroidectomy Left 01/18/2014    Procedure: LEFT THYROID LOBECTOMY;  Surgeon: Coralie Keens, MD;  Location: [facility];  Service: General;  Laterality: Left;    There were no vitals filed for this visit.  Visit Diagnosis:  Neck pain  Left shoulder pain  Muscle spasms of head and/or neck      Subjective Assessment - 04/24/15 0932    Subjective still hurts, doing HEP, some pain but okay   Currently in Pain? Yes   Pain Score 6    Pain Location Neck                         [facility] Adult PT Treatment/Exercise - 04/24/15 0001    Exercises   Exercises Neck;Shoulder   Neck Exercises: Machines for Strengthening   Cybex Row 15# 2 sets 10   Other Machines for Strengthening Nustep L4 5 min UE only   Other Machines for Strengthening lat pull 15# 10 times 2 sets    Neck Exercises: Standing   Neck Retraction 15 reps;3 secs  w/ball   Shoulder Exercises: Standing   Other Standing Exercises red tband scap stab 3 way 15 times each   Other Standing Exercises ball vs wall 5 times CC and CW   Moist Heat Therapy   Number Minutes Moist Heat 15 Minutes   Moist Heat Location Shoulder   Electrical Stimulation   Electrical Stimulation Location left cervical and upper trap area   Electrical Stimulation Action IFC   Electrical Stimulation Goals Pain                  PT Short Term Goals - 04/24/15 0946    PT SHORT TERM GOAL #1   Title independent with initial HEP   Status Achieved           PT Long Term Goals - 04/19/15  0923    PT LONG TERM GOAL #1   Title understand proper posture and body mechanics   Time 8   Period Weeks   Status New   PT LONG TERM GOAL #2   Title increase cervical ROM 50%   Time 8   Period Weeks   Status New   PT LONG TERM GOAL #3   Title increase left shoulder AROM to = the right shoulder   Time 8   Period Weeks   Status New   PT LONG TERM GOAL #4   Title decrease pain 50%   Time 8   Period Weeks   Status New   PT LONG TERM GOAL #5   Title lift her child without pain >4/10   Time 8   Period Weeks   Status New               Plan - 04/24/15 0945    Clinical Impression Statement tolerated interventions with some VCing for correct tech and minimal increase in pain, improved func ROM visually noted from eval   PT Next Visit Plan assess ther ex from last session and progress        Problem List Patient Active Problem List   Diagnosis Date Noted  .  Thyroid nodule 01/18/2014  . PIH (pregnancy induced hypertension) 09/14/2013  . NSVD (normal spontaneous vaginal delivery) 09/14/2013  . Hidradenitis axillaris 11/21/2011  . Chronic cholecystitis with calculus 04/15/2011    Yvonne Fisher,[name] 04/24/2015, 9:47 AM  [place], [place], [address] Phone: [phone]   Fax:  [phone]  Name: Yvonne Fisher MRN: [id number] Date of Birth: 1976-02-19

## 2015-04-26 ENCOUNTER — Ambulatory Visit: Payer: No Typology Code available for payment source | Admitting: Physical Therapy

## 2015-04-26 ENCOUNTER — Encounter: Payer: Self-pay | Admitting: Physical Therapy

## 2015-04-26 DIAGNOSIS — M542 Cervicalgia: Secondary | ICD-10-CM | POA: Diagnosis not present

## 2015-04-26 DIAGNOSIS — M62838 Other muscle spasm: Secondary | ICD-10-CM

## 2015-04-26 DIAGNOSIS — M25512 Pain in left shoulder: Secondary | ICD-10-CM

## 2015-04-26 NOTE — Telephone Encounter (Signed)
Is this ok?

## 2015-04-26 NOTE — Therapy (Signed)
[place], [place], [address] Phone: [phone]   Fax:  [phone]  Physical Therapy Treatment  Patient Details  Name: Yvonne Fisher MRN: [id number] Date of Birth: [birth year] Referring Provider: Reginia Forts  Encounter Date: 04/26/2015      PT End of Session - 04/26/15 0857    Visit Number 3   Date for PT Re-Evaluation 06/20/15   PT Start Time 0835   PT Stop Time 0925   PT Time Calculation (min) 50 min      Past Medical History  Diagnosis Date  . Arthritis     right elbow   . Hidradenitis 11/2011    left axilla  . Rash 12/01/2011    bilat. axilla  . Pregnancy induced hypertension 2015    "still keeping eye on it" (01/18/2014)  . Thyroid nodule   . GERD (gastroesophageal reflux disease)   . Diabetes ([place])   . Gestational diabetes     diet controlled    Past Surgical History  Procedure Laterality Date  . Elbow arthroscopy  1997    right elbow  . Cholecystectomy  04/15/2011    Procedure: LAPAROSCOPIC CHOLECYSTECTOMY WITH INTRAOPERATIVE CHOLANGIOGRAM;  Surgeon: Adin Hector, MD;  [facility];  Service: General;  Laterality: N/A;  laparoscopic cholecystectomy with cholangiogram  . Hydradenitis excision  12/05/2011    Procedure: EXCISION HYDRADENITIS AXILLA;  Surgeon: Harl Bowie, MD;  Location: [facility];  Service: General;  Laterality: Left;  wide excision hidradenitis left axilla  . Thyroid lobectomy Left 01/18/2014  . Thyroidectomy Left 01/18/2014    Procedure: LEFT THYROID LOBECTOMY;  Surgeon: Coralie Keens, MD;  Location: [facility];  Service: General;  Laterality: Left;    There were no vitals filed for this visit.  Visit Diagnosis:  Neck pain  Muscle spasms of head and/or neck  Left shoulder pain      Subjective Assessment - 04/26/15 0842    Subjective felt okay after last session, estim helps   Currently in Pain? Yes   Pain Score 5    Pain Location Neck            [facility] PT Assessment - 04/26/15 0001    AROM   Overall AROM Comments cerv flex decreased 50% all other motion decreased 25%, shld flexion 125                     [facility] Adult PT Treatment/Exercise - 04/26/15 0001    Neck Exercises: Machines for Strengthening   Cybex Row 15# 2 sets 10   Other Machines for Strengthening lat pull 15# 10 times 2 sets    Neck Exercises: Standing   Neck Retraction 15 reps;3 secs   Shoulder Exercises: Standing   Other Standing Exercises ball vs wall 5 times CC and CW   Moist Heat Therapy   Number Minutes Moist Heat 15 Minutes   Moist Heat Location Shoulder   Electrical Stimulation   Electrical Stimulation Location left cervical and upper trap area   Electrical Stimulation Action IFC   Electrical Stimulation Goals Pain                PT Education - 04/26/15 0844    Education provided Yes   Education Details red tband scap stab   Person(s) Educated Patient   Methods Explanation;Demonstration;Handout   Comprehension Verbalized understanding;Returned demonstration          PT Short Term  Goals - 04/24/15 0946    PT SHORT TERM GOAL #1   Title independent with initial HEP   Status Achieved           PT Long Term Goals - 04/26/15 0110    PT LONG TERM GOAL #1   Title understand proper posture and body mechanics   Status On-going   PT LONG TERM GOAL #2   Title increase cervical ROM 50%   Baseline progressing see treatment ROM   Status Partially Met   PT LONG TERM GOAL #3   Title increase left shoulder AROM to = the right shoulder   Status On-going   PT LONG TERM GOAL #4   Title decrease pain 50%   Status On-going   PT LONG TERM GOAL #5   Title lift her child without pain >4/10   Status On-going               Plan - 04/26/15 0857    Clinical Impression Statement tolerated ther ex well, min cuing to decrease compensation. issued HEP and pt tolerated well   PT Next Visit Plan  continue with ROM, postural strengthening and UE ROM        Problem List Patient Active Problem List   Diagnosis Date Noted  . Thyroid nodule 01/18/2014  . PIH (pregnancy induced hypertension) 09/14/2013  . NSVD (normal spontaneous vaginal delivery) 09/14/2013  . Hidradenitis axillaris 11/21/2011  . Chronic cholecystitis with calculus 04/15/2011    PAYSEUR,[name] 04/26/2015, 9:00 AM  [place], [place], [address] Phone: [phone]   Fax:  [phone]  Name: Yvonne Fisher MRN: [id number] Date of Birth: [birth year]

## 2015-04-26 NOTE — Telephone Encounter (Signed)
Letter printed.

## 2015-04-26 NOTE — Telephone Encounter (Signed)
Yes. OK for note through next Wednesday.

## 2015-04-26 NOTE — Telephone Encounter (Signed)
Pt.notified

## 2015-04-26 NOTE — Telephone Encounter (Signed)
Pt needs oow note through next wednesday - she is still in [place]

## 2015-05-01 ENCOUNTER — Encounter: Payer: Self-pay | Admitting: Physical Therapy

## 2015-05-01 ENCOUNTER — Ambulatory Visit: Payer: No Typology Code available for payment source | Attending: Family Medicine | Admitting: Physical Therapy

## 2015-05-01 ENCOUNTER — Telehealth: Payer: Self-pay

## 2015-05-01 ENCOUNTER — Encounter: Payer: Self-pay | Admitting: *Deleted

## 2015-05-01 DIAGNOSIS — M542 Cervicalgia: Secondary | ICD-10-CM | POA: Insufficient documentation

## 2015-05-01 DIAGNOSIS — M62838 Other muscle spasm: Secondary | ICD-10-CM

## 2015-05-01 DIAGNOSIS — M25512 Pain in left shoulder: Secondary | ICD-10-CM | POA: Insufficient documentation

## 2015-05-01 DIAGNOSIS — M6248 Contracture of muscle, other site: Secondary | ICD-10-CM | POA: Diagnosis present

## 2015-05-01 NOTE — Therapy (Signed)
[place], [place], [address] Phone: [phone]   Fax:  [phone]  Physical Therapy Treatment  Patient Details  Name: Yvonne Fisher MRN: [id number] Date of Birth: 06-Jun-1975 Referring Provider: Reginia Forts  Encounter Date: 05/01/2015      PT End of Session - 05/01/15 0915    Visit Number 4   Date for PT Re-Evaluation 06/20/15   PT Start Time 0845   PT Stop Time 0940   PT Time Calculation (min) 55 min      Past Medical History  Diagnosis Date  . Arthritis     right elbow   . Hidradenitis 11/2011    left axilla  . Rash 12/01/2011    bilat. axilla  . Pregnancy induced hypertension 2015    "still keeping eye on it" (01/18/2014)  . Thyroid nodule   . GERD (gastroesophageal reflux disease)   . Diabetes ([place])   . Gestational diabetes     diet controlled    Past Surgical History  Procedure Laterality Date  . Elbow arthroscopy  1997    right elbow  . Cholecystectomy  04/15/2011    Procedure: LAPAROSCOPIC CHOLECYSTECTOMY WITH INTRAOPERATIVE CHOLANGIOGRAM;  Surgeon: Adin Hector, MD;  [facility];  Service: General;  Laterality: N/A;  laparoscopic cholecystectomy with cholangiogram  . Hydradenitis excision  12/05/2011    Procedure: EXCISION HYDRADENITIS AXILLA;  Surgeon: Harl Bowie, MD;  Location: [facility];  Service: General;  Laterality: Left;  wide excision hidradenitis left axilla  . Thyroid lobectomy Left 01/18/2014  . Thyroidectomy Left 01/18/2014    Procedure: LEFT THYROID LOBECTOMY;  Surgeon: Coralie Keens, MD;  Location: [facility];  Service: General;  Laterality: Left;    There were no vitals filed for this visit.  Visit Diagnosis:  Neck pain  Muscle spasms of head and/or neck  Left shoulder pain      Subjective Assessment - 05/01/15 0850    Subjective getting better, HEP is good   Currently in Pain? Yes   Pain Score 4    Pain Location Neck    Pain Orientation Left            [facility] PT Assessment - 05/01/15 0001    AROM   Overall AROM Comments Cerv flex decreased 25 % ,shld flex 140                       [facility] Adult PT Treatment/Exercise - 05/01/15 0001    Neck Exercises: Machines for Strengthening   Cybex Row 20# 2 sets 10   Other Machines for Strengthening Nustep L4 5 min UE only   Other Machines for Strengthening lat pull 20# 2 sets 10   Neck Exercises: Standing   Other Standing Exercises ball vs wall 5 times CC and CW   Other Standing Exercises 3# 4 pt rhy stab   Shoulder Exercises: Seated   External Rotation Strengthening;Both;10 reps  2 sets red tband   Other Seated Exercises 3 # ext and retraction 2 sets 10   Shoulder Exercises: Standing   Other Standing Exercises upright row 15# 2 set s10   Moist Heat Therapy   Number Minutes Moist Heat 15 Minutes   Moist Heat Location Shoulder   Electrical Stimulation   Electrical Stimulation Location left cervical and upper trap area   Electrical Stimulation Action IFC   Electrical Stimulation Goals Pain  PT Short Term Goals - 04/24/15 0946    PT SHORT TERM GOAL #1   Title independent with initial HEP   Status Achieved           PT Long Term Goals - 05/01/15 0916    PT LONG TERM GOAL #1   Title understand proper posture and body mechanics   Status On-going   PT LONG TERM GOAL #2   Title increase cervical ROM 50%   Status Partially Met   PT LONG TERM GOAL #3   Title increase left shoulder AROM to = the right shoulder   Status On-going   PT LONG TERM GOAL #4   Title decrease pain 50%   Status On-going   PT LONG TERM GOAL #5   Title lift her child without pain >4/10   Status On-going               Plan - 05/01/15 0915    Clinical Impression Statement imporved ROM and func, RT thor spasm with ther ex. Cuing for BM with ther ex   PT Next Visit Plan continue with ROM, postural strengthening and UE ROM         Problem List Patient Active Problem List   Diagnosis Date Noted  . Thyroid nodule 01/18/2014  . PIH (pregnancy induced hypertension) 09/14/2013  . NSVD (normal spontaneous vaginal delivery) 09/14/2013  . Hidradenitis axillaris 11/21/2011  . Chronic cholecystitis with calculus 04/15/2011    PAYSEUR,[name] 05/01/2015, 9:17 AM  [place], [place], [address] Phone: [phone]   Fax:  [phone]  Name: GESELLE CARDOSA MRN: [id number] Date of Birth: January 07, 1976

## 2015-05-01 NOTE — Telephone Encounter (Signed)
Pt states at one time she was put on light duty, she is back to work now and her job need to know if she is on any restrictions. Please call [phone] and ask for Kathline Magic and you May call her at [phone] if needed

## 2015-05-02 ENCOUNTER — Telehealth: Payer: Self-pay

## 2015-05-02 NOTE — Telephone Encounter (Signed)
Kristopher Glee from [employer] called in regards to [place] coming back to work tomorrow and she wants to know if she has any restrictions when she returns.  Please advise Kristopher Glee  [phone]

## 2015-05-02 NOTE — Telephone Encounter (Signed)
The note says to go back on 05/03/2015. Please clarify.

## 2015-05-02 NOTE — Telephone Encounter (Signed)
I have not seen the patient since 04-16-15; I referred her to ortho at that visit and she had an appointment with Raliegh Ip on 04-17-15.  My note from 04/16/15 stated that she could return to work on 04-16-15 on [place]. I am not aware of the note that was written on 04-26-15 that stated that she could return to 05-02-15 though the note is signed by me.  I do not see a note in the chart on 04-26-15 nor an office visit where patient was seen at [facility] to clear her to return to work?  This is very perplexing to me. I cannot clear the patient to return to regular duty without an office visit, yet I referred her to ortho who was to evaluate her on 04/17/15; thus she must receive a RTW note from them OR she must return to office to be seen by me again. Did she go to [employer] on 04-17-15?

## 2015-05-03 ENCOUNTER — Encounter: Payer: Self-pay | Admitting: Physical Therapy

## 2015-05-03 ENCOUNTER — Ambulatory Visit: Payer: No Typology Code available for payment source | Admitting: Physical Therapy

## 2015-05-03 DIAGNOSIS — M25512 Pain in left shoulder: Secondary | ICD-10-CM

## 2015-05-03 DIAGNOSIS — M542 Cervicalgia: Secondary | ICD-10-CM

## 2015-05-03 DIAGNOSIS — M62838 Other muscle spasm: Secondary | ICD-10-CM

## 2015-05-03 NOTE — Telephone Encounter (Signed)
Spoke with pt, I advised her to call her orthopedist and ask for a note. He in turn will make the decision on this. Pt understood.

## 2015-05-03 NOTE — Telephone Encounter (Signed)
Patient is calling because she has to return to work and her employer needs to know if there are any restrictions. Please call patient!

## 2015-05-03 NOTE — Therapy (Signed)
[place], [place], [address] Phone: [phone]   Fax:  [phone]  Physical Therapy Treatment  Patient Details  Name: Yvonne Fisher MRN: [id number] Date of Birth: Sep 26, 1975 Referring Provider: Reginia Forts  Encounter Date: 05/03/2015      PT End of Session - 05/03/15 1652    Visit Number 5   Date for PT Re-Evaluation 06/20/15   PT Start Time 1615   PT Stop Time 1710   PT Time Calculation (min) 55 min      Past Medical History  Diagnosis Date  . Arthritis     right elbow   . Hidradenitis 11/2011    left axilla  . Rash 12/01/2011    bilat. axilla  . Pregnancy induced hypertension 2015    "still keeping eye on it" (01/18/2014)  . Thyroid nodule   . GERD (gastroesophageal reflux disease)   . Diabetes ([place])   . Gestational diabetes     diet controlled    Past Surgical History  Procedure Laterality Date  . Elbow arthroscopy  1997    right elbow  . Cholecystectomy  04/15/2011    Procedure: LAPAROSCOPIC CHOLECYSTECTOMY WITH INTRAOPERATIVE CHOLANGIOGRAM;  Surgeon: Adin Hector, MD;  [facility];  Service: General;  Laterality: N/A;  laparoscopic cholecystectomy with cholangiogram  . Hydradenitis excision  12/05/2011    Procedure: EXCISION HYDRADENITIS AXILLA;  Surgeon: Harl Bowie, MD;  Location: [facility];  Service: General;  Laterality: Left;  wide excision hidradenitis left axilla  . Thyroid lobectomy Left 01/18/2014  . Thyroidectomy Left 01/18/2014    Procedure: LEFT THYROID LOBECTOMY;  Surgeon: Coralie Keens, MD;  Location: [facility];  Service: General;  Laterality: Left;    There were no vitals filed for this visit.  Visit Diagnosis:  Muscle spasms of head and/or neck  Left shoulder pain  Neck pain      Subjective Assessment - 05/03/15 1622    Subjective 20% better, spasms still come and go. Did not return to work-was only going back because I  need money but not physically ready   Currently in Pain? Yes   Pain Score 5    Pain Location Neck   Pain Orientation Left            [facility] PT Assessment - 05/03/15 0001    AROM   Overall AROM Comments Cerv flex decreased 25 % ,shld flex 140                       [facility] Adult PT Treatment/Exercise - 05/03/15 0001    Neck Exercises: Machines for Strengthening   UBE (Upper Arm Bike) /3 back L1   Cybex Row 20# 2 sets 10   Cybex Chest Press 10# 2 sets 10 with serratus   Other Machines for Strengthening lat pull 20# 2 sets 10   Shoulder Exercises: Standing   Other Standing Exercises finger ladder ecc lowering 5 times flex and abd 3#  increased wrist pain   Other Standing Exercises ball vs wall 5 times CC and CW   Moist Heat Therapy   Number Minutes Moist Heat 15 Minutes   Moist Heat Location Shoulder   Electrical Stimulation   Electrical Stimulation Location left cervical and upper trap area   Electrical Stimulation Action IFC   Electrical Stimulation Goals Pain   Manual Therapy   Manual Therapy Soft tissue mobilization;Neural Stretch   Manual therapy  comments very tight Left UT,cerv and rhom   Neural Stretch + Left UE                  PT Short Term Goals - 04/24/15 0946    PT SHORT TERM GOAL #1   Title independent with initial HEP   Status Achieved           PT Long Term Goals - 05/03/15 1655    PT LONG TERM GOAL #1   Title understand proper posture and body mechanics   PT LONG TERM GOAL #2   Title increase cervical ROM 50%   Status Partially Met   PT LONG TERM GOAL #3   Title increase left shoulder AROM to = the right shoulder   Status On-going   PT LONG TERM GOAL #4   Title decrease pain 50%   Status On-going   PT LONG TERM GOAL #5   Title lift her child without pain >4/10   Status On-going               Plan - 05/03/15 1653    Clinical Impression Statement improving ROM cerv and UE, continues with weakness and func  limitations d/t weakness and pain. positive NT left UE. Very tight in Left cerv adn UT area.   PT Next Visit Plan continue with ROM, postural strengthening and UE ROM        Problem List Patient Active Problem List   Diagnosis Date Noted  . Thyroid nodule 01/18/2014  . PIH (pregnancy induced hypertension) 09/14/2013  . NSVD (normal spontaneous vaginal delivery) 09/14/2013  . Hidradenitis axillaris 11/21/2011  . Chronic cholecystitis with calculus 04/15/2011    Tarika Mckethan,[name] 05/03/2015, 4:57 PM  [place], [place], [address] Phone: [phone]   Fax:  [phone]  Name: Yvonne Fisher MRN: [id number] Date of Birth: 09/25/1975

## 2015-05-03 NOTE — Telephone Encounter (Signed)
Spoke with pt, advised her to get a note from ortho.

## 2015-05-08 ENCOUNTER — Encounter: Payer: Self-pay | Admitting: Physical Therapy

## 2015-05-08 ENCOUNTER — Ambulatory Visit: Payer: No Typology Code available for payment source | Admitting: Physical Therapy

## 2015-05-08 DIAGNOSIS — M62838 Other muscle spasm: Secondary | ICD-10-CM

## 2015-05-08 DIAGNOSIS — M542 Cervicalgia: Secondary | ICD-10-CM

## 2015-05-08 DIAGNOSIS — M25512 Pain in left shoulder: Secondary | ICD-10-CM

## 2015-05-08 NOTE — Therapy (Signed)
[place], [place], [address] Phone: [phone]   Fax:  [phone]  Physical Therapy Treatment  Patient Details  Name: Yvonne Fisher MRN: [id number] Date of Birth: [birth year] Referring Provider: Reginia Forts  Encounter Date: 05/08/2015      PT End of Session - 05/08/15 1522    Visit Number 6   Date for PT Re-Evaluation 06/20/15   PT Start Time 1430   PT Stop Time 1530   PT Time Calculation (min) 60 min   Activity Tolerance Patient tolerated treatment well   Behavior During Therapy [facility] for tasks assessed/performed      Past Medical History  Diagnosis Date  . Arthritis     right elbow   . Hidradenitis 11/2011    left axilla  . Rash 12/01/2011    bilat. axilla  . Pregnancy induced hypertension 2015    "still keeping eye on it" (01/18/2014)  . Thyroid nodule   . GERD (gastroesophageal reflux disease)   . Diabetes ([place])   . Gestational diabetes     diet controlled    Past Surgical History  Procedure Laterality Date  . Elbow arthroscopy  1997    right elbow  . Cholecystectomy  04/15/2011    Procedure: LAPAROSCOPIC CHOLECYSTECTOMY WITH INTRAOPERATIVE CHOLANGIOGRAM;  Surgeon: Adin Hector, MD;  [facility];  Service: General;  Laterality: N/A;  laparoscopic cholecystectomy with cholangiogram  . Hydradenitis excision  12/05/2011    Procedure: EXCISION HYDRADENITIS AXILLA;  Surgeon: Harl Bowie, MD;  Location: [place];  Service: General;  Laterality: Left;  wide excision hidradenitis left axilla  . Thyroid lobectomy Left 01/18/2014  . Thyroidectomy Left 01/18/2014    Procedure: LEFT THYROID LOBECTOMY;  Surgeon: Coralie Keens, MD;  Location: [place];  Service: General;  Laterality: Left;    There were no vitals filed for this visit.  Visit Diagnosis:  Neck pain  Left shoulder pain  Muscle spasms of head and/or neck      Subjective Assessment -  05/08/15 1433    Subjective "Im here"   Currently in Pain? Yes   Pain Score 5    Pain Location Neck  Shoulder, and wrist   Pain Orientation Left            [facility] PT Assessment - 05/08/15 0001    AROM   Overall AROM Comments Cerv flex decreased 25 % ,shld flex 142                       [facility] Adult PT Treatment/Exercise - 05/08/15 0001    Neck Exercises: Machines for Strengthening   UBE (Upper Arm Bike) 3 frd/3 back L2   Cybex Row 20# 2 sets 10   Cybex Chest Press 10# 2 sets 10 with serratus   Other Machines for Strengthening lat pull 20# 2 sets 10   Shoulder Exercises: Standing   External Rotation 10 reps;Theraband  2 sets    Theraband Level (Shoulder External Rotation) Level 1 (Yellow)   Flexion 10 reps;Weights  2 sets   Shoulder Flexion Weight (lbs) 2   ABduction 10 reps;Weights  2 sets    Shoulder ABduction Weight (lbs) 1   Extension 15 reps;Theraband  2 sets    Theraband Level (Shoulder Extension) Level 2 (Red)   Other Standing Exercises 3 way scap stab red Tband  x10 each   Other Standing Exercises OHP with yellow ball  2x10   Moist Heat Therapy   Number Minutes Moist Heat 15 Minutes   Moist Heat Location Shoulder   Electrical Stimulation   Electrical Stimulation Location left cervical and upper trap area   Electrical Stimulation Action IFC   Electrical Stimulation Goals Pain   Manual Therapy   Manual Therapy Soft tissue mobilization;Neural Stretch   Manual therapy comments very tight Left UT,cerv and rhom   Neural Stretch + Left UE                  PT Short Term Goals - 04/24/15 0946    PT SHORT TERM GOAL #1   Title independent with initial HEP   Status Achieved           PT Long Term Goals - 05/08/15 1449    PT LONG TERM GOAL #2   Title increase cervical ROM 50%   Status Achieved               Plan - 05/08/15 1522    Clinical Impression Statement Pt able to complete all interventions with appropriate ROM this  date. Pt reports increase L shoulder and wrist pain. Pt continues to reports limitation when holding and taking care of her 44 month old son.   Pt will benefit from skilled therapeutic intervention in order to improve on the following deficits Decreased activity tolerance;Decreased range of motion;Decreased strength;Increased muscle spasms;Impaired flexibility;Postural dysfunction;Improper body mechanics;Pain   Rehab Potential Good   PT Frequency 2x / week   PT Duration 8 weeks   PT Treatment/Interventions ADLs/Self Care Home Management;Cryotherapy;Electrical Stimulation;Moist Heat;Therapeutic exercise;Therapeutic activities;Ultrasound;Patient/family education;Manual techniques   PT Next Visit Plan continue with ROM, postural strengthening and UE ROM        Problem List Patient Active Problem List   Diagnosis Date Noted  . Thyroid nodule 01/18/2014  . PIH (pregnancy induced hypertension) 09/14/2013  . NSVD (normal spontaneous vaginal delivery) 09/14/2013  . Hidradenitis axillaris 11/21/2011  . Chronic cholecystitis with calculus 04/15/2011    Scot Jun, PTA  05/08/2015, 3:26 PM  [place], [place], [address] Phone: [phone]   Fax:  [phone]  Name: Yvonne Fisher MRN: [id number] Date of Birth: 1975-05-23

## 2015-05-14 ENCOUNTER — Encounter: Payer: Self-pay | Admitting: Physical Therapy

## 2015-05-14 ENCOUNTER — Ambulatory Visit (INDEPENDENT_AMBULATORY_CARE_PROVIDER_SITE_OTHER): Payer: 59 | Admitting: Family Medicine

## 2015-05-14 ENCOUNTER — Ambulatory Visit: Payer: No Typology Code available for payment source | Admitting: Physical Therapy

## 2015-05-14 ENCOUNTER — Encounter: Payer: Self-pay | Admitting: Family Medicine

## 2015-05-14 VITALS — BP 124/82 | HR 82 | Temp 97.9°F | Resp 18 | Wt 239.4 lb

## 2015-05-14 DIAGNOSIS — M25512 Pain in left shoulder: Secondary | ICD-10-CM

## 2015-05-14 DIAGNOSIS — E669 Obesity, unspecified: Secondary | ICD-10-CM

## 2015-05-14 DIAGNOSIS — R7989 Other specified abnormal findings of blood chemistry: Secondary | ICD-10-CM

## 2015-05-14 DIAGNOSIS — M542 Cervicalgia: Secondary | ICD-10-CM

## 2015-05-14 DIAGNOSIS — E119 Type 2 diabetes mellitus without complications: Secondary | ICD-10-CM

## 2015-05-14 DIAGNOSIS — S161XXD Strain of muscle, fascia and tendon at neck level, subsequent encounter: Secondary | ICD-10-CM

## 2015-05-14 DIAGNOSIS — R945 Abnormal results of liver function studies: Secondary | ICD-10-CM

## 2015-05-14 DIAGNOSIS — M62838 Other muscle spasm: Secondary | ICD-10-CM

## 2015-05-14 LAB — GLUCOSE, POCT (MANUAL RESULT ENTRY): POC GLUCOSE: 114 mg/dL — AB (ref 70–99)

## 2015-05-14 LAB — COMPREHENSIVE METABOLIC PANEL
ALBUMIN: 4 g/dL (ref 3.6–5.1)
ALK PHOS: 84 U/L (ref 33–115)
ALT: 16 U/L (ref 6–29)
AST: 13 U/L (ref 10–30)
BUN: 10 mg/dL (ref 7–25)
CALCIUM: 9.4 mg/dL (ref 8.6–10.2)
CO2: 26 mmol/L (ref 20–31)
Chloride: 99 mmol/L (ref 98–110)
Creat: 0.68 mg/dL (ref 0.50–1.10)
GLUCOSE: 105 mg/dL — AB (ref 65–99)
POTASSIUM: 3.8 mmol/L (ref 3.5–5.3)
Sodium: 136 mmol/L (ref 135–146)
Total Bilirubin: 0.4 mg/dL (ref 0.2–1.2)
Total Protein: 7 g/dL (ref 6.1–8.1)

## 2015-05-14 LAB — POCT GLYCOSYLATED HEMOGLOBIN (HGB A1C): Hemoglobin A1C: 6.9

## 2015-05-14 NOTE — Patient Instructions (Signed)

## 2015-05-14 NOTE — Therapy (Signed)
[place], [place], [address] Phone: [phone]   Fax:  [phone]  Physical Therapy Treatment  Patient Details  Name: Yvonne Fisher MRN: [id number] Date of Birth: 02/01/1976 Referring Provider: Reginia Forts  Encounter Date: 05/14/2015      PT End of Session - 05/14/15 1515    Visit Number 7   Date for PT Re-Evaluation 06/20/15   PT Start Time 1432   PT Stop Time 1528   PT Time Calculation (min) 56 min   Activity Tolerance Patient tolerated treatment well   Behavior During Therapy [facility] for tasks assessed/performed      Past Medical History  Diagnosis Date  . Arthritis     right elbow   . Hidradenitis 11/2011    left axilla  . Rash 12/01/2011    bilat. axilla  . Pregnancy induced hypertension 2015    "still keeping eye on it" (01/18/2014)  . Thyroid nodule   . GERD (gastroesophageal reflux disease)   . Diabetes ([place])   . Gestational diabetes     diet controlled    Past Surgical History  Procedure Laterality Date  . Elbow arthroscopy  1997    right elbow  . Cholecystectomy  04/15/2011    Procedure: LAPAROSCOPIC CHOLECYSTECTOMY WITH INTRAOPERATIVE CHOLANGIOGRAM;  Surgeon: Adin Hector, MD;  [facility];  Service: General;  Laterality: N/A;  laparoscopic cholecystectomy with cholangiogram  . Hydradenitis excision  12/05/2011    Procedure: EXCISION HYDRADENITIS AXILLA;  Surgeon: Harl Bowie, MD;  Location: [facility];  Service: General;  Laterality: Left;  wide excision hidradenitis left axilla  . Thyroid lobectomy Left 01/18/2014  . Thyroidectomy Left 01/18/2014    Procedure: LEFT THYROID LOBECTOMY;  Surgeon: Coralie Keens, MD;  Location: [facility];  Service: General;  Laterality: Left;    There were no vitals filed for this visit.  Visit Diagnosis:  Muscle spasms of head and/or neck  Left shoulder pain  Neck pain      Subjective Assessment -  05/14/15 1434    Subjective "Im here, It's getting a little better " Pt reports sharp pains that comes and goes   Currently in Pain? Yes   Pain Score 4    Pain Location Neck   Pain Orientation Left            [facility] PT Assessment - 05/14/15 0001    AROM   Overall AROM Comments Cervical and shoulder ROM WNL                     [facility] Adult PT Treatment/Exercise - 05/14/15 0001    Neck Exercises: Machines for Strengthening   UBE (Upper Arm Bike) 3 frd/3 back L2   Cybex Row 25# 2 sets 10   Cybex Chest Press 15# 2 sets 10 with serratus   Other Machines for Strengthening lat pull 20# 2 sets 10   Shoulder Exercises: Sidelying   Other Sidelying Exercises wall pushups 2x10   Shoulder Exercises: Standing   External Rotation 10 reps;Theraband   Theraband Level (Shoulder External Rotation) Level 2 (Red)  2 sets    Flexion 10 reps;Weights  2 sets    Shoulder Flexion Weight (lbs) 3   ABduction 10 reps;Weights  2 sets   Shoulder ABduction Weight (lbs) 2   Extension 10 reps;Theraband  3 sets   Theraband Level (Shoulder Extension) Level 2 (Red)   Other Standing Exercises 3 way  scap stab red Tband  x10 each   Other Standing Exercises OHP with yellow ball  2x10   Moist Heat Therapy   Number Minutes Moist Heat 15 Minutes   Moist Heat Location Shoulder   Electrical Stimulation   Electrical Stimulation Location left cervical and upper trap area   Electrical Stimulation Action IFC   Electrical Stimulation Goals Pain                  PT Short Term Goals - 04/24/15 0946    PT SHORT TERM GOAL #1   Title independent with initial HEP   Status Achieved           PT Long Term Goals - 05/14/15 1505    PT LONG TERM GOAL #3   Title increase left shoulder AROM to = the right shoulder   Status Achieved               Plan - 05/14/15 1516    Clinical Impression Statement Pt reports that she feels like she is getting a little better, Pt has met ROM goal.  Shows good strength throughout session but continues to report some pain in R shoulder and R wrist.   Pt will benefit from skilled therapeutic intervention in order to improve on the following deficits Decreased activity tolerance;Decreased range of motion;Decreased strength;Increased muscle spasms;Impaired flexibility;Postural dysfunction;Improper body mechanics;Pain   Rehab Potential Good   PT Frequency 2x / week   PT Duration 8 weeks   PT Treatment/Interventions ADLs/Self Care Home Management;Cryotherapy;Electrical Stimulation;Moist Heat;Therapeutic exercise;Therapeutic activities;Ultrasound;Patient/family education;Manual techniques   PT Next Visit Plan continue with ROM, postural strengthening and UE ROM        Problem List Patient Active Problem List   Diagnosis Date Noted  . Thyroid nodule 01/18/2014  . PIH (pregnancy induced hypertension) 09/14/2013  . NSVD (normal spontaneous vaginal delivery) 09/14/2013  . Hidradenitis axillaris 11/21/2011  . Chronic cholecystitis with calculus 04/15/2011    Scot Jun, PTA  05/14/2015, 3:18 PM  [place], [place], [address] Phone: [phone]   Fax:  [phone]  Name: Yvonne Fisher MRN: [id number] Date of Birth: [birth year]

## 2015-05-14 NOTE — Progress Notes (Signed)
Subjective:    Patient ID: Yvonne Fisher, female    DOB: [birth year], 40 y.o.   MRN: [id number]  05/14/2015  Follow-up   HPI This 40 y.o. female presents for three month follow-up:   1. DMII: started Metformin at last visit due to HgbA1c of 7.3. Nocturia x 1 only.   Just started Metformin three weeks ago.  Taking one pill per day; usually taking it around 12:00-2:00pm at lunchtime.  Not checking sugars; needs test strips; does not have money for strips.  Not sure of meter.  Got meter during pregnancy and on Medicaid.  Last eye exam 09-2014; [facility].  +glasses  2.  Cervical strain and L shoulder strain: still undergoing physical therapy.  Evaluation by orthopedist in 04/23/2015.  Recommended continued physical therapy; will continue physical therapy until 06-24-15. Then follows up with ortho; possible injection.  Improved range of motion. Still having sharp shooting pain and pressure intermittently.  No medication at this time.  With normal daily activities (washing dishes, clothes, etc) causes worsening pain.  Had called requesting work note; got note from orthopedist and physical therapist.    3.  Breast cancer screening: had one at [place].     Review of Systems  Constitutional: Negative for fever, chills, diaphoresis and fatigue.  Eyes: Negative for visual disturbance.  Respiratory: Negative for cough and shortness of breath.   Cardiovascular: Negative for chest pain, palpitations and leg swelling.  Gastrointestinal: Negative for nausea, vomiting, abdominal pain, diarrhea and constipation.  Endocrine: Negative for cold intolerance, heat intolerance, polydipsia, polyphagia and polyuria.  Neurological: Negative for dizziness, tremors, seizures, syncope, facial asymmetry, speech difficulty, weakness, light-headedness, numbness and headaches.    Past Medical History  Diagnosis Date  . Arthritis     right elbow   . Hidradenitis 11/2011    left axilla  . Rash 12/01/2011   bilat. axilla  . Pregnancy induced hypertension 2015    "still keeping eye on it" (01/18/2014)  . Thyroid nodule   . GERD (gastroesophageal reflux disease)   . Diabetes ([place])   . Gestational diabetes     diet controlled   Past Surgical History  Procedure Laterality Date  . Elbow arthroscopy  1997    right elbow  . Cholecystectomy  04/15/2011    Procedure: LAPAROSCOPIC CHOLECYSTECTOMY WITH INTRAOPERATIVE CHOLANGIOGRAM;  Surgeon: Adin Hector, MD;  [facility];  Service: General;  Laterality: N/A;  laparoscopic cholecystectomy with cholangiogram  . Hydradenitis excision  12/05/2011    Procedure: EXCISION HYDRADENITIS AXILLA;  Surgeon: Harl Bowie, MD;  Location: [facility];  Service: General;  Laterality: Left;  wide excision hidradenitis left axilla  . Thyroid lobectomy Left 01/18/2014  . Thyroidectomy Left 01/18/2014    Procedure: LEFT THYROID LOBECTOMY;  Surgeon: Coralie Keens, MD;  Location: [facility];  Service: General;  Laterality: Left;   Allergies  Allergen Reactions  . Adhesive [Tape] Other (See Comments)    TEARS SKIN  . Prevacid [Lansoprazole] Hives   Current Outpatient Prescriptions  Medication Sig Dispense Refill  . metFORMIN (GLUCOPHAGE) 500 MG tablet Take 1 tablet by mouth daily with largest meal. 90 tablet 1  . meloxicam (MOBIC) 15 MG tablet Take 1 tablet (15 mg total) by mouth daily. (Patient not taking: Reported on 05/14/2015) 30 tablet 0  . methocarbamol (ROBAXIN) 500 MG tablet Take 1-2 tablets (500-1,000 mg total) by mouth every 6 (six) hours as needed for muscle spasms. (Patient not taking: Reported on 05/14/2015) 40 tablet  0  . predniSONE (DELTASONE) 20 MG tablet Three tablets x 2 days then two tablets daily x 5 days then one tablet daily x 5 days (Patient not taking: Reported on 05/14/2015) 21 tablet 0   No current facility-administered medications for this visit.   Social History   Social History  . Marital Status: Single    Spouse  Name: N/A  . Number of Children: N/A  . Years of Education: N/A   Occupational History  . Not on file.   Social History Main Topics  . Smoking status: Never Smoker   . Smokeless tobacco: Never Used  . Alcohol Use: Yes     Comment: 01/18/2014 "might have a drink @ birthday party or holidays; not all the time"  . Drug Use: No  . Sexual Activity: No   Other Topics Concern  . Not on file   Social History Narrative   Marital status: single; dating seriously x 10 years; happy; no abuse.      Children:  (15 yo son, 69 week old boy).      Lives: with 2 sons, boyfriend.      Employment:  [employer] x 15 years      Tobacco: none      Alcohol: none      Drug: none   Family History  Problem Relation Age of Onset  . Hypertension Mother   . Multiple sclerosis Mother   . Diabetes Father   . Heart disease Father 52    AMI       Objective:    BP 124/82 mmHg  Pulse 82  Temp(Src) 97.9 F (36.6 C) (Oral)  Resp 18  Wt 239 lb 6.4 oz (108.591 kg)  SpO2 98%  LMP 04/16/2015 Physical Exam  Constitutional: She is oriented to person, place, and time. She appears well-developed and well-nourished. No distress.  HENT:  Head: Normocephalic and atraumatic.  Right Ear: External ear normal.  Left Ear: External ear normal.  Nose: Nose normal.  Mouth/Throat: Oropharynx is clear and moist.  Eyes: Conjunctivae and EOM are normal. Pupils are equal, round, and reactive to light.  Neck: Normal range of motion. Neck supple. Carotid bruit is not present. No thyromegaly present.  Cardiovascular: Normal rate, regular rhythm, normal heart sounds and intact distal pulses.  Exam reveals no gallop and no friction rub.   No murmur heard. Pulmonary/Chest: Effort normal and breath sounds normal. She has no wheezes. She has no rales.  Abdominal: Soft. Bowel sounds are normal. She exhibits no distension and no mass. There is no tenderness. There is no rebound and no guarding.  Lymphadenopathy:     She has no cervical adenopathy.  Neurological: She is alert and oriented to person, place, and time. No cranial nerve deficit.  Skin: Skin is warm and dry. No rash noted. She is not diaphoretic. No erythema. No pallor.  Psychiatric: She has a normal mood and affect. Her behavior is normal.   Results for orders placed or performed in visit on 05/14/15  POCT glucose (manual entry)  Result Value Ref Range   POC Glucose 114 (A) 70 - 99 mg/dl  POCT glycosylated hemoglobin (Hb A1C)  Result Value Ref Range   Hemoglobin A1C 6.9        Assessment & Plan:   1. Type 2 diabetes mellitus without complication, without long-term current use of insulin (HCC)   2. Elevated LFTs   3. Obesity   4. Neck strain, subsequent encounter  Orders Placed This Encounter  Procedures  . Comprehensive metabolic panel  . POCT glucose (manual entry)  . POCT glycosylated hemoglobin (Hb A1C)   No orders of the defined types were placed in this encounter.    Return in about 4 months (around 09/11/2015) for recheck.    Kristi Elayne Guerin, M.D. [facility] [address] [place], [place]  [address] [phone] phone [phone] fax

## 2015-05-17 ENCOUNTER — Ambulatory Visit: Payer: No Typology Code available for payment source | Admitting: Physical Therapy

## 2015-05-17 ENCOUNTER — Encounter: Payer: Self-pay | Admitting: Physical Therapy

## 2015-05-17 DIAGNOSIS — M62838 Other muscle spasm: Secondary | ICD-10-CM

## 2015-05-17 DIAGNOSIS — M542 Cervicalgia: Secondary | ICD-10-CM | POA: Diagnosis not present

## 2015-05-17 DIAGNOSIS — M25512 Pain in left shoulder: Secondary | ICD-10-CM

## 2015-05-17 NOTE — Therapy (Signed)
[place], [place], [address] Phone: [phone]   Fax:  [phone]  Physical Therapy Treatment  Patient Details  Name: Yvonne Fisher MRN: [id number] Date of Birth: [birth year] Referring Provider: Reginia Forts  Encounter Date: 05/17/2015      PT End of Session - 05/17/15 1511    Visit Number 8   Date for PT Re-Evaluation 06/20/15   PT Start Time 1430   PT Stop Time 1511   PT Time Calculation (min) 41 min   Activity Tolerance Patient tolerated treatment well   Behavior During Therapy [facility] for tasks assessed/performed      Past Medical History  Diagnosis Date  . Arthritis     right elbow   . Hidradenitis 11/2011    left axilla  . Rash 12/01/2011    bilat. axilla  . Pregnancy induced hypertension 2015    "still keeping eye on it" (01/18/2014)  . Thyroid nodule   . GERD (gastroesophageal reflux disease)   . Diabetes ([place])   . Gestational diabetes     diet controlled    Past Surgical History  Procedure Laterality Date  . Elbow arthroscopy  1997    right elbow  . Cholecystectomy  04/15/2011    Procedure: LAPAROSCOPIC CHOLECYSTECTOMY WITH INTRAOPERATIVE CHOLANGIOGRAM;  Surgeon: Adin Hector, MD;  [facility];  Service: General;  Laterality: N/A;  laparoscopic cholecystectomy with cholangiogram  . Hydradenitis excision  12/05/2011    Procedure: EXCISION HYDRADENITIS AXILLA;  Surgeon: Harl Bowie, MD;  Location: [place];  Service: General;  Laterality: Left;  wide excision hidradenitis left axilla  . Thyroid lobectomy Left 01/18/2014  . Thyroidectomy Left 01/18/2014    Procedure: LEFT THYROID LOBECTOMY;  Surgeon: Coralie Keens, MD;  Location: [facility];  Service: General;  Laterality: Left;    There were no vitals filed for this visit.  Visit Diagnosis:  Neck pain  Left shoulder pain  Muscle spasms of head and/or neck      Subjective Assessment -  05/17/15 1433    Subjective "Its pressure right now"   Currently in Pain? Yes   Pain Score 4    Pain Location Shoulder   Pain Orientation Left                         [facility] Adult PT Treatment/Exercise - 05/17/15 0001    Neck Exercises: Machines for Strengthening   UBE (Upper Arm Bike) 3 frd/3 back L2   Cybex Row 35# 3 sets 10   Cybex Chest Press 20# 3 sets 10 with serratus   Other Machines for Strengthening lat pull 25#  3 sets 10   Neck Exercises: Standing   Other Standing Exercises OHP with blue weighted ball 2x15    Shoulder Exercises: Standing   Flexion 10 reps;Weights  2 sets    Shoulder Flexion Weight (lbs) 4   ABduction 10 reps;Weights  2 sets   Shoulder ABduction Weight (lbs) 3   Modalities   Modalities Ultrasound   Ultrasound   Ultrasound Location L trap    Ultrasound Parameters 1.1Mhz 1.2 wcm2   Ultrasound Goals Pain  tightness                   PT Short Term Goals - 04/24/15 0946    PT SHORT TERM GOAL #1   Title independent with initial HEP   Status Achieved  PT Long Term Goals - 05/14/15 1505    PT LONG TERM GOAL #3   Title increase left shoulder AROM to = the right shoulder   Status Achieved               Plan - 05/17/15 1514    Clinical Impression Statement All interventions this date consisted of increased weight and reps. Pt demos good strength and ROM with both UE. Tried [place] for the first time without issue. Reports improvement over time.   Pt will benefit from skilled therapeutic intervention in order to improve on the following deficits Decreased activity tolerance;Decreased range of motion;Decreased strength;Increased muscle spasms;Impaired flexibility;Postural dysfunction;Improper body mechanics;Pain   Rehab Potential Good   PT Frequency 2x / week   PT Duration 8 weeks   PT Treatment/Interventions ADLs/Self Care Home Management;Cryotherapy;Electrical Stimulation;Moist Heat;Therapeutic  exercise;Therapeutic activities;Ultrasound;Patient/family education;Manual techniques   PT Next Visit Plan postural strengthening and UE ROM        Problem List Patient Active Problem List   Diagnosis Date Noted  . Thyroid nodule 01/18/2014  . PIH (pregnancy induced hypertension) 09/14/2013  . NSVD (normal spontaneous vaginal delivery) 09/14/2013  . Hidradenitis axillaris 11/21/2011  . Chronic cholecystitis with calculus 04/15/2011    Scot Jun, PTA  05/17/2015, 3:17 PM  [place], [place], [address] Phone: [phone]   Fax:  [phone]  Name: Yvonne Fisher MRN: [id number] Date of Birth: Jan 16, 1976

## 2015-05-22 ENCOUNTER — Encounter: Payer: Self-pay | Admitting: Physical Therapy

## 2015-05-22 ENCOUNTER — Encounter: Payer: Self-pay | Admitting: Family Medicine

## 2015-05-22 ENCOUNTER — Ambulatory Visit: Payer: No Typology Code available for payment source | Admitting: Physical Therapy

## 2015-05-22 DIAGNOSIS — M542 Cervicalgia: Secondary | ICD-10-CM | POA: Diagnosis not present

## 2015-05-22 DIAGNOSIS — M25512 Pain in left shoulder: Secondary | ICD-10-CM

## 2015-05-22 DIAGNOSIS — M62838 Other muscle spasm: Secondary | ICD-10-CM

## 2015-05-22 NOTE — Therapy (Signed)
[place], [place], [address] Phone: [phone]   Fax:  [phone]  Physical Therapy Treatment  Patient Details  Name: Yvonne Fisher MRN: [id number] Date of Birth: [birth year] Referring Provider: Reginia Forts  Encounter Date: 05/22/2015      PT End of Session - 05/22/15 1423    Visit Number 9   Date for PT Re-Evaluation 06/20/15   PT Start Time 1345   PT Stop Time 1439   PT Time Calculation (min) 54 min   Activity Tolerance Patient tolerated treatment well   Behavior During Therapy [facility] for tasks assessed/performed      Past Medical History  Diagnosis Date  . Arthritis     right elbow   . Hidradenitis 11/2011    left axilla  . Rash 12/01/2011    bilat. axilla  . Pregnancy induced hypertension 2015    "still keeping eye on it" (01/18/2014)  . Thyroid nodule   . GERD (gastroesophageal reflux disease)   . Diabetes ([place])   . Gestational diabetes     diet controlled    Past Surgical History  Procedure Laterality Date  . Elbow arthroscopy  1997    right elbow  . Cholecystectomy  04/15/2011    Procedure: LAPAROSCOPIC CHOLECYSTECTOMY WITH INTRAOPERATIVE CHOLANGIOGRAM;  Surgeon: Adin Hector, MD;  [facility];  Service: General;  Laterality: N/A;  laparoscopic cholecystectomy with cholangiogram  . Hydradenitis excision  12/05/2011    Procedure: EXCISION HYDRADENITIS AXILLA;  Surgeon: Harl Bowie, MD;  Location: [place];  Service: General;  Laterality: Left;  wide excision hidradenitis left axilla  . Thyroid lobectomy Left 01/18/2014  . Thyroidectomy Left 01/18/2014    Procedure: LEFT THYROID LOBECTOMY;  Surgeon: Coralie Keens, MD;  Location: [place];  Service: General;  Laterality: Left;    There were no vitals filed for this visit.  Visit Diagnosis:  Muscle spasms of head and/or neck  Left shoulder pain      Subjective Assessment - 05/22/15 1345    Subjective Pt reports that she has been picking up her some more and has been experiencing increase discomfort. Pt states that she is scheduled to have an injection next Friday in L shoulder    Currently in Pain? Yes   Pain Score 5    Pain Location Shoulder   Pain Orientation Left            [facility] PT Assessment - 05/22/15 0001    Strength   Overall Strength Comments 4-/5 with pain in the left upper trap and neck                     [facility] Adult PT Treatment/Exercise - 05/22/15 0001    Neck Exercises: Machines for Strengthening   UBE (Upper Arm Bike) 3 frd/3 back L 3.5   Cybex Row 25# 2 sets 15 reps    Other Machines for Strengthening lat pull 25#  2 sets 15   Shoulder Exercises: Sidelying   Other Sidelying Exercises wall pushups 2x10   Shoulder Exercises: Standing   External Rotation Theraband;15 reps   Theraband Level (Shoulder External Rotation) Level 2 (Red)   Flexion 15 reps  2 sets    Shoulder Flexion Weight (lbs) 2   ABduction 15 reps;Weights  2 sets    Shoulder ABduction Weight (lbs) 2   Extension 15 reps  2 sets    Theraband Level (Shoulder Extension) Level  3 (Green)   Other Standing Exercises 3 way scap stab red Tband  x10 each   Other Standing Exercises OHP with blue ball  2x10                  PT Short Term Goals - 04/24/15 0946    PT SHORT TERM GOAL #1   Title independent with initial HEP   Status Achieved           PT Long Term Goals - 05/14/15 1505    PT LONG TERM GOAL #3   Title increase left shoulder AROM to = the right shoulder   Status Achieved               Plan - 05/22/15 1424    Clinical Impression Statement Lighter weight with increased reps this date reports increase discomfort pre treatment. Pt with a minor strength increase with UE. Has injection scheduled for next Friday.   Pt will benefit from skilled therapeutic intervention in order to improve on the following deficits Decreased activity  tolerance;Decreased range of motion;Decreased strength;Increased muscle spasms;Impaired flexibility;Postural dysfunction;Improper body mechanics;Pain   PT Frequency 2x / week   PT Duration 8 weeks   PT Treatment/Interventions ADLs/Self Care Home Management;Cryotherapy;Electrical Stimulation;Moist Heat;Therapeutic exercise;Therapeutic activities;Ultrasound;Patient/family education;Manual techniques   PT Next Visit Plan postural strengthening and UE ROM        Problem List Patient Active Problem List   Diagnosis Date Noted  . Thyroid nodule 01/18/2014  . PIH (pregnancy induced hypertension) 09/14/2013  . NSVD (normal spontaneous vaginal delivery) 09/14/2013  . Hidradenitis axillaris 11/21/2011  . Chronic cholecystitis with calculus 04/15/2011    Scot Jun, PTA  05/22/2015, 2:26 PM  [place], [place], [address] Phone: [phone]   Fax:  [phone]  Name: KARRY ZIESKE MRN: [id number] Date of Birth: April 27, 1976

## 2015-05-24 ENCOUNTER — Encounter: Payer: Self-pay | Admitting: Physical Therapy

## 2015-05-24 ENCOUNTER — Ambulatory Visit: Payer: No Typology Code available for payment source | Admitting: Physical Therapy

## 2015-05-24 DIAGNOSIS — M62838 Other muscle spasm: Secondary | ICD-10-CM

## 2015-05-24 DIAGNOSIS — M25512 Pain in left shoulder: Secondary | ICD-10-CM

## 2015-05-24 DIAGNOSIS — M542 Cervicalgia: Secondary | ICD-10-CM | POA: Diagnosis not present

## 2015-05-24 NOTE — Therapy (Signed)
[place], [place], [address] Phone: [phone]   Fax:  [phone]  Physical Therapy Treatment  Patient Details  Name: Yvonne Fisher MRN: [id number] Date of Birth: 01/01/1976 Referring Provider: Reginia Forts  Encounter Date: 05/24/2015      PT End of Session - 05/24/15 1424    Visit Number 10   Date for PT Re-Evaluation 06/20/15   PT Start Time 1345   PT Stop Time 1439   PT Time Calculation (min) 54 min   Activity Tolerance Patient tolerated treatment well   Behavior During Therapy [facility] for tasks assessed/performed      Past Medical History  Diagnosis Date  . Arthritis     right elbow   . Hidradenitis 11/2011    left axilla  . Rash 12/01/2011    bilat. axilla  . Pregnancy induced hypertension 2015    "still keeping eye on it" (01/18/2014)  . Thyroid nodule   . GERD (gastroesophageal reflux disease)   . Diabetes ([place])   . Gestational diabetes     diet controlled    Past Surgical History  Procedure Laterality Date  . Elbow arthroscopy  1997    right elbow  . Cholecystectomy  04/15/2011    Procedure: LAPAROSCOPIC CHOLECYSTECTOMY WITH INTRAOPERATIVE CHOLANGIOGRAM;  Surgeon: Adin Hector, MD;  [facility];  Service: General;  Laterality: N/A;  laparoscopic cholecystectomy with cholangiogram  . Hydradenitis excision  12/05/2011    Procedure: EXCISION HYDRADENITIS AXILLA;  Surgeon: Harl Bowie, MD;  Location: [place];  Service: General;  Laterality: Left;  wide excision hidradenitis left axilla  . Thyroid lobectomy Left 01/18/2014  . Thyroidectomy Left 01/18/2014    Procedure: LEFT THYROID LOBECTOMY;  Surgeon: Coralie Keens, MD;  Location: [place];  Service: General;  Laterality: Left;    There were no vitals filed for this visit.  Visit Diagnosis:  Neck pain  Left shoulder pain  Muscle spasms of head and/or neck      Subjective Assessment -  05/24/15 1344    Subjective "Doing a little better"   Currently in Pain? Yes   Pain Score 4    Pain Location Shoulder   Pain Orientation Left                         [facility] Adult PT Treatment/Exercise - 05/24/15 0001    Neck Exercises: Machines for Strengthening   UBE (Upper Arm Bike) 3 frd/3 back L 3.5   Cybex Row 35# 3 sets 10 reps    Cybex Chest Press 25# 2 sets 15 with serratus   Other Machines for Strengthening lat pull 35#  3 sets 10   Shoulder Exercises: Standing   Flexion 15 reps   Shoulder Flexion Weight (lbs) 3   ABduction 15 reps;Weights   Shoulder ABduction Weight (lbs) 3   Other Standing Exercises 3 way scap stab red Tband  x10 each; OHP #4 3x10; Bicep curls #5 x15    Other Standing Exercises Standing rev grip rows #35 2x15; straight arm pull downs #35 2x15   Modalities   Modalities Moist Heat;Electrical Stimulation   Moist Heat Therapy   Number Minutes Moist Heat 15 Minutes   Moist Heat Location Shoulder   Electrical Stimulation   Electrical Stimulation Location left cervical and upper trap area   Electrical Stimulation Action IFC   Electrical Stimulation Goals Pain  PT Short Term Goals - 04/24/15 0946    PT SHORT TERM GOAL #1   Title independent with initial HEP   Status Achieved           PT Long Term Goals - 05/14/15 1505    PT LONG TERM GOAL #3   Title increase left shoulder AROM to = the right shoulder   Status Achieved               Plan - 05/24/15 1426    Clinical Impression Statement Pt reports that she feels better overall. Tolerated all exercises well does have L shoulder pain as muscles fatigue.   Pt will benefit from skilled therapeutic intervention in order to improve on the following deficits Decreased activity tolerance;Decreased range of motion;Decreased strength;Increased muscle spasms;Impaired flexibility;Postural dysfunction;Improper body mechanics;Pain   Rehab Potential Good   PT  Frequency 2x / week   PT Duration 8 weeks   PT Treatment/Interventions ADLs/Self Care Home Management;Cryotherapy;Electrical Stimulation;Moist Heat;Therapeutic exercise;Therapeutic activities;Ultrasound;Patient/family education;Manual techniques   PT Next Visit Plan continue postural strengthening and UE ROM        Problem List Patient Active Problem List   Diagnosis Date Noted  . Thyroid nodule 01/18/2014  . PIH (pregnancy induced hypertension) 09/14/2013  . NSVD (normal spontaneous vaginal delivery) 09/14/2013  . Hidradenitis axillaris 11/21/2011  . Chronic cholecystitis with calculus 04/15/2011    Scot Jun, PTA  05/24/2015, 2:30 PM  [place], [place], [address] Phone: [phone]   Fax:  [phone]  Name: Yvonne Fisher MRN: [id number] Date of Birth: June 21, 1975

## 2015-05-29 ENCOUNTER — Ambulatory Visit: Payer: No Typology Code available for payment source | Admitting: Physical Therapy

## 2015-05-29 ENCOUNTER — Encounter: Payer: Self-pay | Admitting: Physical Therapy

## 2015-05-29 DIAGNOSIS — M62838 Other muscle spasm: Secondary | ICD-10-CM

## 2015-05-29 DIAGNOSIS — M542 Cervicalgia: Secondary | ICD-10-CM | POA: Diagnosis not present

## 2015-05-29 DIAGNOSIS — M25512 Pain in left shoulder: Secondary | ICD-10-CM

## 2015-05-29 NOTE — Therapy (Signed)
[place], [place], [address] Phone: [phone]   Fax:  [phone]  Physical Therapy Treatment  Patient Details  Name: Yvonne Fisher MRN: [id number] Date of Birth: Sep 15, 1975 Referring Provider: Reginia Forts  Encounter Date: 05/29/2015      PT End of Session - 05/29/15 1348    Visit Number 11   Date for PT Re-Evaluation 06/20/15   PT Start Time 1300   PT Stop Time 1400   PT Time Calculation (min) 60 min      Past Medical History  Diagnosis Date  . Arthritis     right elbow   . Hidradenitis 11/2011    left axilla  . Rash 12/01/2011    bilat. axilla  . Pregnancy induced hypertension 2015    "still keeping eye on it" (01/18/2014)  . Thyroid nodule   . GERD (gastroesophageal reflux disease)   . Diabetes ([place])   . Gestational diabetes     diet controlled    Past Surgical History  Procedure Laterality Date  . Elbow arthroscopy  1997    right elbow  . Cholecystectomy  04/15/2011    Procedure: LAPAROSCOPIC CHOLECYSTECTOMY WITH INTRAOPERATIVE CHOLANGIOGRAM;  Surgeon: Adin Hector, MD;  [facility];  Service: General;  Laterality: N/A;  laparoscopic cholecystectomy with cholangiogram  . Hydradenitis excision  12/05/2011    Procedure: EXCISION HYDRADENITIS AXILLA;  Surgeon: Harl Bowie, MD;  Location: [place];  Service: General;  Laterality: Left;  wide excision hidradenitis left axilla  . Thyroid lobectomy Left 01/18/2014  . Thyroidectomy Left 01/18/2014    Procedure: LEFT THYROID LOBECTOMY;  Surgeon: Coralie Keens, MD;  Location: [place];  Service: General;  Laterality: Left;    There were no vitals filed for this visit.  Visit Diagnosis:  Muscle spasms of head and/or neck  Left shoulder pain      Subjective Assessment - 05/29/15 1259    Subjective "I was trying to do my hair but my arm will not last"   Pain Score 4    Pain Location Shoulder   Pain Orientation Left                         [facility] Adult PT Treatment/Exercise - 05/29/15 0001    Neck Exercises: Machines for Strengthening   UBE (Upper Arm Bike) 3 frd/3 back L 4   Cybex Row 35# 3 sets 10 reps    Cybex Chest Press 25# 2 sets 15 with serratus   Other Machines for Strengthening lat pull 35#  3 sets 10   Shoulder Exercises: Standing   Flexion 15 reps  2 sets    Shoulder Flexion Weight (lbs) 3   ABduction 15 reps;Weights  2 sets    Shoulder ABduction Weight (lbs) 3   Other Standing Exercises 3 way scap stab red Tband  x15 each; OHP yellow ball x15; Bicep curls #4 2x15    Other Standing Exercises Standing rev grip rows #35 2x15; straight arm pull downs #35 2x15   Modalities   Modalities Moist Heat;Electrical Stimulation   Moist Heat Therapy   Number Minutes Moist Heat 15 Minutes   Moist Heat Location Shoulder   Electrical Stimulation   Electrical Stimulation Location left cervical and upper trap area   Electrical Stimulation Action IFC   Electrical Stimulation Goals Pain  PT Short Term Goals - 04/24/15 0946    PT SHORT TERM GOAL #1   Title independent with initial HEP   Status Achieved           PT Long Term Goals - 05/29/15 1309    PT LONG TERM GOAL #3   Title increase left shoulder AROM to = the right shoulder   Status Achieved   PT LONG TERM GOAL #5   Title lift her child without pain >4/10   Status Partially Met               Plan - 05/29/15 1348    Clinical Impression Statement Pt continues with shoulder strength and ROM but does reports pain & pressure in L trap area. Reports that she can hold er child now but for only short periods. Pt stated that she [facility].   Pt will benefit from skilled therapeutic intervention in order to improve on the following deficits Decreased activity tolerance;Decreased range of motion;Decreased strength;Increased muscle spasms;Impaired  flexibility;Postural dysfunction;Improper body mechanics;Pain   Rehab Potential Good   PT Frequency 2x / week   PT Duration 8 weeks   PT Treatment/Interventions ADLs/Self Care Home Management;Cryotherapy;Electrical Stimulation;Moist Heat;Therapeutic exercise;Therapeutic activities;Ultrasound;Patient/family education;Manual techniques   PT Next Visit Plan continue postural strengthening and UE ROM        Problem List Patient Active Problem List   Diagnosis Date Noted  . Thyroid nodule 01/18/2014  . PIH (pregnancy induced hypertension) 09/14/2013  . NSVD (normal spontaneous vaginal delivery) 09/14/2013  . Hidradenitis axillaris 11/21/2011  . Chronic cholecystitis with calculus 04/15/2011    Scot Jun, PTA  05/29/2015, 1:50 PM  [place], [place], [address] Phone: [phone]   Fax:  [phone]  Name: MOZEL BURDETT MRN: [id number] Date of Birth: 1976-01-10

## 2015-05-31 ENCOUNTER — Encounter: Payer: Self-pay | Admitting: Physical Therapy

## 2015-05-31 ENCOUNTER — Ambulatory Visit: Payer: No Typology Code available for payment source | Attending: Family Medicine | Admitting: Physical Therapy

## 2015-05-31 DIAGNOSIS — M542 Cervicalgia: Secondary | ICD-10-CM | POA: Diagnosis present

## 2015-05-31 DIAGNOSIS — M62838 Other muscle spasm: Secondary | ICD-10-CM

## 2015-05-31 DIAGNOSIS — M25512 Pain in left shoulder: Secondary | ICD-10-CM | POA: Diagnosis present

## 2015-05-31 DIAGNOSIS — M6248 Contracture of muscle, other site: Secondary | ICD-10-CM | POA: Insufficient documentation

## 2015-05-31 NOTE — Therapy (Signed)
[place], [place], [address] Phone: [phone]   Fax:  [phone]  Physical Therapy Treatment  Patient Details  Name: Yvonne Fisher MRN: [id number] Date of Birth: 1975-06-05 Referring Provider: Reginia Forts  Encounter Date: 05/31/2015      PT End of Session - 05/31/15 1146    Visit Number 12   Date for PT Re-Evaluation 06/20/15   PT Start Time 1102   PT Stop Time 1159   PT Time Calculation (min) 57 min   Activity Tolerance Patient tolerated treatment well   Behavior During Therapy [facility] for tasks assessed/performed      Past Medical History  Diagnosis Date  . Arthritis     right elbow   . Hidradenitis 11/2011    left axilla  . Rash 12/01/2011    bilat. axilla  . Pregnancy induced hypertension 2015    "still keeping eye on it" (01/18/2014)  . Thyroid nodule   . GERD (gastroesophageal reflux disease)   . Diabetes ([place])   . Gestational diabetes     diet controlled    Past Surgical History  Procedure Laterality Date  . Elbow arthroscopy  1997    right elbow  . Cholecystectomy  04/15/2011    Procedure: LAPAROSCOPIC CHOLECYSTECTOMY WITH INTRAOPERATIVE CHOLANGIOGRAM;  Surgeon: Adin Hector, MD;  [facility];  Service: General;  Laterality: N/A;  laparoscopic cholecystectomy with cholangiogram  . Hydradenitis excision  12/05/2011    Procedure: EXCISION HYDRADENITIS AXILLA;  Surgeon: Harl Bowie, MD;  Location: [place];  Service: General;  Laterality: Left;  wide excision hidradenitis left axilla  . Thyroid lobectomy Left 01/18/2014  . Thyroidectomy Left 01/18/2014    Procedure: LEFT THYROID LOBECTOMY;  Surgeon: Coralie Keens, MD;  Location: [place];  Service: General;  Laterality: Left;    There were no vitals filed for this visit.  Visit Diagnosis:  Left shoulder pain  Muscle spasms of head and/or neck  Neck pain      Subjective Assessment -  05/31/15 1103    Subjective Pt reports that she woke up with L elbow pain.    Currently in Pain? Yes   Pain Score 3    Pain Location Shoulder  Elbow                         [facility] Adult PT Treatment/Exercise - 05/31/15 0001    Neck Exercises: Machines for Strengthening   UBE (Upper Arm Bike) 2 frd/2 back L 4   Cybex Row 45# 3 sets 10 reps    Cybex Chest Press 25# 3 sets 10 with serratus   Other Machines for Strengthening Elliptical I10 R5 x84mn   Other Machines for Strengthening lat pull 35#  3 sets 10   Shoulder Exercises: Standing   Horizontal ABduction 15 reps;Theraband   Theraband Level (Shoulder Horizontal ABduction) Level 3 (Green)   Flexion 20 reps;Weights   Shoulder Flexion Weight (lbs) 3   ABduction Weights;20 reps   Shoulder ABduction Weight (lbs) 3   Other Standing Exercises OHP #5 2x10; Bicep curle #5 x20    Other Standing Exercises Standing rev grip rows #35 2x15; straight arm pull downs #35 2x15   Modalities   Modalities Moist Heat;Electrical Stimulation   Moist Heat Therapy   Number Minutes Moist Heat 15 Minutes   Moist Heat Location Shoulder   Electrical Stimulation   Electrical Stimulation Location left cervical  and upper trap area   Electrical Stimulation Action IFC   Electrical Stimulation Goals Pain                  PT Short Term Goals - 04/24/15 0946    PT SHORT TERM GOAL #1   Title independent with initial HEP   Status Achieved           PT Long Term Goals - 05/31/15 1147    PT LONG TERM GOAL #1   Title understand proper posture and body mechanics   Status Partially Met               Plan - 05/31/15 1146    Clinical Impression Statement Again good strength and ROM just reports pain in upper L trap area, Goes to MD tomorrow for injection.   Pt will benefit from skilled therapeutic intervention in order to improve on the following deficits Decreased activity tolerance;Decreased range of motion;Decreased  strength;Increased muscle spasms;Impaired flexibility;Postural dysfunction;Improper body mechanics;Pain   Rehab Potential Good   PT Frequency 2x / week   PT Duration 8 weeks   PT Treatment/Interventions ADLs/Self Care Home Management;Cryotherapy;Electrical Stimulation;Moist Heat;Therapeutic exercise;Therapeutic activities;Ultrasound;Patient/family education;Manual techniques   PT Next Visit Plan Get orders from MD         Problem List Patient Active Problem List   Diagnosis Date Noted  . Thyroid nodule 01/18/2014  . PIH (pregnancy induced hypertension) 09/14/2013  . NSVD (normal spontaneous vaginal delivery) 09/14/2013  . Hidradenitis axillaris 11/21/2011  . Chronic cholecystitis with calculus 04/15/2011    Scot Jun, PTA  05/31/2015, 11:48 AM  [place], [place], [address] Phone: [phone]   Fax:  [phone]  Name: CLEOPATRA SARDO MRN: [id number] Date of Birth: 20-Jan-1976

## 2015-06-06 ENCOUNTER — Encounter: Payer: Self-pay | Admitting: Physical Therapy

## 2015-06-06 ENCOUNTER — Ambulatory Visit: Payer: No Typology Code available for payment source | Admitting: Physical Therapy

## 2015-06-06 DIAGNOSIS — M25512 Pain in left shoulder: Secondary | ICD-10-CM | POA: Diagnosis not present

## 2015-06-06 DIAGNOSIS — M62838 Other muscle spasm: Secondary | ICD-10-CM

## 2015-06-06 NOTE — Therapy (Signed)
[place], [place], [address] Phone: [phone]   Fax:  [phone]  Physical Therapy Treatment  Patient Details  Name: Yvonne Fisher MRN: [id number] Date of Birth: 04-Jun-1975 Referring Provider: Reginia Forts  Encounter Date: 06/06/2015      PT End of Session - 06/06/15 1518    Visit Number 13   Date for PT Re-Evaluation 06/20/15   PT Start Time 1433   PT Stop Time 1516   PT Time Calculation (min) 43 min   Activity Tolerance Patient tolerated treatment well   Behavior During Therapy [facility] for tasks assessed/performed      Past Medical History  Diagnosis Date  . Arthritis     right elbow   . Hidradenitis 11/2011    left axilla  . Rash 12/01/2011    bilat. axilla  . Pregnancy induced hypertension 2015    "still keeping eye on it" (01/18/2014)  . Thyroid nodule   . GERD (gastroesophageal reflux disease)   . Diabetes ([place])   . Gestational diabetes     diet controlled    Past Surgical History  Procedure Laterality Date  . Elbow arthroscopy  1997    right elbow  . Cholecystectomy  04/15/2011    Procedure: LAPAROSCOPIC CHOLECYSTECTOMY WITH INTRAOPERATIVE CHOLANGIOGRAM;  Surgeon: Adin Hector, MD;  [facility];  Service: General;  Laterality: N/A;  laparoscopic cholecystectomy with cholangiogram  . Hydradenitis excision  12/05/2011    Procedure: EXCISION HYDRADENITIS AXILLA;  Surgeon: Harl Bowie, MD;  Location: [place];  Service: General;  Laterality: Left;  wide excision hidradenitis left axilla  . Thyroid lobectomy Left 01/18/2014  . Thyroidectomy Left 01/18/2014    Procedure: LEFT THYROID LOBECTOMY;  Surgeon: Coralie Keens, MD;  Location: [place];  Service: General;  Laterality: Left;    There were no vitals filed for this visit.  Visit Diagnosis:  Left shoulder pain  Muscle spasms of head and/or neck      Subjective Assessment - 06/06/15 1434    Subjective Pt received and injection in L shoulder Friday, Pt reports that the pain comes and goes   Currently in Pain? Yes   Pain Score 2    Pain Location Shoulder   Pain Orientation Left            [facility] PT Assessment - 06/06/15 0001    AROM   Overall AROM Comments Cervical and shoulder ROM WNL   Strength   Overall Strength Comments 4/5 with pain in the left upper trap and neck                     [facility] Adult PT Treatment/Exercise - 06/06/15 0001    Neck Exercises: Machines for Strengthening   UBE (Upper Arm Bike) 3 frd/3 back L 4   Cybex Row 45# 2 sets 15 reps    Cybex Chest Press 25# 3 sets 15 with serratus   Other Machines for Strengthening lat pull 35#  2 sets 15   Shoulder Exercises: Standing   Horizontal ABduction 15 reps;Theraband  2 sets    Theraband Level (Shoulder Horizontal ABduction) Level 3 (Green)   Flexion Weights;10 reps  2 sets    Shoulder Flexion Weight (lbs) 4   ABduction 15 reps;Both;Weights  2 sets   Shoulder ABduction Weight (lbs) 4   Extension Theraband;Both;15 reps  2 sets    Theraband Level (Shoulder Extension) Level 3 (Green)  Other Standing Exercises OHP #5 2x10; Bicep curle #5 x20    Other Standing Exercises Standing rev grip rows #35 2x15; straight arm pull downs #35 2x15; Shrugs #8 2x15                   PT Short Term Goals - 04/24/15 0946    PT SHORT TERM GOAL #1   Title independent with initial HEP   Status Achieved           PT Long Term Goals - 05/31/15 1147    PT LONG TERM GOAL #1   Title understand proper posture and body mechanics   Status Partially Met               Plan - 06/06/15 1518    Clinical Impression Statement Injection received Friday, orders to continue PT. Pt reports that she is getting better pain just comes and goes now. Com pleated all interventions well does have soreness as muscles fatigue.   Pt will benefit from skilled therapeutic intervention in order to improve on  the following deficits Decreased activity tolerance;Decreased range of motion;Decreased strength;Increased muscle spasms;Impaired flexibility;Postural dysfunction;Improper body mechanics;Pain   Rehab Potential Good   PT Frequency 2x / week   PT Duration 8 weeks   PT Treatment/Interventions ADLs/Self Care Home Management;Cryotherapy;Electrical Stimulation;Moist Heat;Therapeutic exercise;Therapeutic activities;Ultrasound;Patient/family education;Manual techniques   PT Next Visit Plan Possible Combo         Problem List Patient Active Problem List   Diagnosis Date Noted  . Thyroid nodule 01/18/2014  . PIH (pregnancy induced hypertension) 09/14/2013  . NSVD (normal spontaneous vaginal delivery) 09/14/2013  . Hidradenitis axillaris 11/21/2011  . Chronic cholecystitis with calculus 04/15/2011    Scot Jun, PTA  06/06/2015, 3:20 PM  [place], [place], [address] Phone: [phone]   Fax:  [phone]  Name: Yvonne Fisher MRN: [id number] Date of Birth: 02-Nov-1975

## 2015-06-08 ENCOUNTER — Ambulatory Visit: Payer: No Typology Code available for payment source | Admitting: Physical Therapy

## 2015-06-08 ENCOUNTER — Encounter: Payer: Self-pay | Admitting: Physical Therapy

## 2015-06-08 DIAGNOSIS — M542 Cervicalgia: Secondary | ICD-10-CM

## 2015-06-08 DIAGNOSIS — M62838 Other muscle spasm: Secondary | ICD-10-CM

## 2015-06-08 DIAGNOSIS — M25512 Pain in left shoulder: Secondary | ICD-10-CM | POA: Diagnosis not present

## 2015-06-08 NOTE — Therapy (Signed)
[place], [place], [address] Phone: [phone]   Fax:  [phone]  Physical Therapy Treatment  Patient Details  Name: Yvonne Fisher MRN: [id number] Date of Birth: 01/28/1976 Referring Provider: Reginia Forts  Encounter Date: 06/08/2015      PT End of Session - 06/08/15 0843    Visit Number 14   Date for PT Re-Evaluation 06/20/15   PT Start Time 0800   PT Stop Time 0846   PT Time Calculation (min) 46 min   Activity Tolerance Patient tolerated treatment well   Behavior During Therapy [facility] for tasks assessed/performed      Past Medical History  Diagnosis Date  . Arthritis     right elbow   . Hidradenitis 11/2011    left axilla  . Rash 12/01/2011    bilat. axilla  . Pregnancy induced hypertension 2015    "still keeping eye on it" (01/18/2014)  . Thyroid nodule   . GERD (gastroesophageal reflux disease)   . Diabetes ([place])   . Gestational diabetes     diet controlled    Past Surgical History  Procedure Laterality Date  . Elbow arthroscopy  1997    right elbow  . Cholecystectomy  04/15/2011    Procedure: LAPAROSCOPIC CHOLECYSTECTOMY WITH INTRAOPERATIVE CHOLANGIOGRAM;  Surgeon: Adin Hector, MD;  [facility];  Service: General;  Laterality: N/A;  laparoscopic cholecystectomy with cholangiogram  . Hydradenitis excision  12/05/2011    Procedure: EXCISION HYDRADENITIS AXILLA;  Surgeon: Harl Bowie, MD;  Location: [place];  Service: General;  Laterality: Left;  wide excision hidradenitis left axilla  . Thyroid lobectomy Left 01/18/2014  . Thyroidectomy Left 01/18/2014    Procedure: LEFT THYROID LOBECTOMY;  Surgeon: Coralie Keens, MD;  Location: [place];  Service: General;  Laterality: Left;    There were no vitals filed for this visit.  Visit Diagnosis:  Left shoulder pain  Muscle spasms of head and/or neck  Neck pain      Subjective Assessment -  06/08/15 0802    Subjective "Its feeling better, it hurts here and their but its not as bad as it did"   Currently in Pain? Yes   Pain Score 1    Pain Location Shoulder   Pain Orientation Right;Left                         [facility] Adult PT Treatment/Exercise - 06/08/15 0001    Neck Exercises: Machines for Strengthening   UBE (Upper Arm Bike) 3 frd/3 back L 4   Cybex Row 45# 2 sets 15 reps    Cybex Chest Press 25# 3 sets 15 with serratus   Other Machines for Strengthening Elliptical I10 R5 x92mn   Other Machines for Strengthening lat pull 35#  2 sets 15   Shoulder Exercises: Standing   Horizontal ABduction Theraband;20 reps   Theraband Level (Shoulder Horizontal ABduction) Level 3 (Green)   Flexion Weights;15 reps   Shoulder Flexion Weight (lbs) 4   ABduction 15 reps;Weights   Shoulder ABduction Weight (lbs) 3   Extension 20 reps;Theraband   Theraband Level (Shoulder Extension) Level 3 (Green)   Other Standing Exercises OHP #5 2x10; Bicep curle #5 x20    Other Standing Exercises Standing rev grip rows #35 2x15; straight arm pull downs #35 2x15; Shrugs #8 2x15; Up-right rows #5 2x10  PT Short Term Goals - 04/24/15 0946    PT SHORT TERM GOAL #1   Title independent with initial HEP   Status Achieved           PT Long Term Goals - 05/31/15 1147    PT LONG TERM GOAL #1   Title understand proper posture and body mechanics   Status Partially Met               Plan - 06/08/15 0843    Clinical Impression Statement Pt reports that she is doing well overall  stating that she has had less pain. Completed all interventions well this date. Does have a decrease activity tolerance on elliptical having to stop and take a rest break.    Pt will benefit from skilled therapeutic intervention in order to improve on the following deficits Decreased activity tolerance;Decreased range of motion;Decreased strength;Increased muscle spasms;Impaired  flexibility;Postural dysfunction;Improper body mechanics;Pain   Rehab Potential Good   PT Frequency 2x / week   PT Duration 8 weeks   PT Treatment/Interventions ADLs/Self Care Home Management;Cryotherapy;Electrical Stimulation;Moist Heat;Therapeutic exercise;Therapeutic activities;Ultrasound;Patient/family education;Manual techniques   PT Next Visit Plan functional endurance        Problem List Patient Active Problem List   Diagnosis Date Noted  . Thyroid nodule 01/18/2014  . PIH (pregnancy induced hypertension) 09/14/2013  . NSVD (normal spontaneous vaginal delivery) 09/14/2013  . Hidradenitis axillaris 11/21/2011  . Chronic cholecystitis with calculus 04/15/2011    Scot Jun, PTA  06/08/2015, 8:46 AM  [place], [place], [address] Phone: [phone]   Fax:  [phone]  Name: EURETHA NAJARRO MRN: [id number] Date of Birth: [birth year]

## 2015-06-13 ENCOUNTER — Encounter: Payer: Self-pay | Admitting: Physical Therapy

## 2015-06-13 ENCOUNTER — Ambulatory Visit: Payer: No Typology Code available for payment source | Admitting: Physical Therapy

## 2015-06-13 DIAGNOSIS — M542 Cervicalgia: Secondary | ICD-10-CM

## 2015-06-13 DIAGNOSIS — M25512 Pain in left shoulder: Secondary | ICD-10-CM | POA: Diagnosis not present

## 2015-06-13 DIAGNOSIS — M62838 Other muscle spasm: Secondary | ICD-10-CM

## 2015-06-13 NOTE — Therapy (Signed)
[place], [place], [address] Phone: [phone]   Fax:  [phone]  Physical Therapy Treatment  Patient Details  Name: Yvonne Fisher MRN: [id number] Date of Birth: Dec 02, 1975 Referring Provider: Reginia Forts  Encounter Date: 06/13/2015      PT End of Session - 06/13/15 1007    Visit Number 15   Date for PT Re-Evaluation 06/20/15   PT Start Time 0930   PT Stop Time 1225   PT Time Calculation (min) 44 min   Activity Tolerance Patient tolerated treatment well   Behavior During Therapy [facility] for tasks assessed/performed      Past Medical History  Diagnosis Date  . Arthritis     right elbow   . Hidradenitis 11/2011    left axilla  . Rash 12/01/2011    bilat. axilla  . Pregnancy induced hypertension 2015    "still keeping eye on it" (01/18/2014)  . Thyroid nodule   . GERD (gastroesophageal reflux disease)   . Diabetes ([place])   . Gestational diabetes     diet controlled    Past Surgical History  Procedure Laterality Date  . Elbow arthroscopy  1997    right elbow  . Cholecystectomy  04/15/2011    Procedure: LAPAROSCOPIC CHOLECYSTECTOMY WITH INTRAOPERATIVE CHOLANGIOGRAM;  Surgeon: Adin Hector, MD;  [facility];  Service: General;  Laterality: N/A;  laparoscopic cholecystectomy with cholangiogram  . Hydradenitis excision  12/05/2011    Procedure: EXCISION HYDRADENITIS AXILLA;  Surgeon: Harl Bowie, MD;  Location: [place];  Service: General;  Laterality: Left;  wide excision hidradenitis left axilla  . Thyroid lobectomy Left 01/18/2014  . Thyroidectomy Left 01/18/2014    Procedure: LEFT THYROID LOBECTOMY;  Surgeon: Coralie Keens, MD;  Location: [place];  Service: General;  Laterality: Left;    There were no vitals filed for this visit.  Visit Diagnosis:  Neck pain  Muscle spasms of head and/or neck  Left shoulder pain      Subjective Assessment -  06/13/15 0930    Subjective "Im doing good"   Currently in Pain? No/denies   Pain Score 0-No pain                         [facility] Adult PT Treatment/Exercise - 06/13/15 0001    Neck Exercises: Machines for Strengthening   UBE (Upper Arm Bike) 3 frd/3 back L 4   Cybex Row 45# 2 sets 15 reps    Cybex Chest Press 25# 3 sets 10 with serratus   Other Machines for Strengthening Elipical I10 R5 x59mn   Other Machines for Strengthening lat pull 35#  2 sets 15   Shoulder Exercises: Standing   Horizontal ABduction Theraband;20 reps   Theraband Level (Shoulder Horizontal ABduction) Level 3 (Green)   External Rotation Theraband;10 reps  2 sets    Theraband Level (Shoulder External Rotation) Level 3 (Green)   Flexion Weights;10 reps  2 sets    Shoulder Flexion Weight (lbs) 4   ABduction Weights;10 reps  2 sets    Shoulder ABduction Weight (lbs) 4   Extension 20 reps;Theraband   Theraband Level (Shoulder Extension) Level 3 (Green)   Other Standing Exercises OHP #5 2x10; Bicep curle #5 x20    Other Standing Exercises Standing rev grip rows #35 2x15; straight arm pull downs #35 2x15; Shrugs #8 2x15; Up-right rows #5 2x10  PT Short Term Goals - 04/24/15 0946    PT SHORT TERM GOAL #1   Title independent with initial HEP   Status Achieved           PT Long Term Goals - 05/31/15 1147    PT LONG TERM GOAL #1   Title understand proper posture and body mechanics   Status Partially Met               Plan - 06/13/15 1008    Clinical Impression Statement Pain free before treatment,  Does report some pain in Upper L trap area as muscles fatigue with exercises, continues with decrease activity tolerance with elliptical. Pt reports that she returns to the MD 2/21. Pt instructed to schedule one more PT appoint after MD visit.   Pt will benefit from skilled therapeutic intervention in order to improve on the following deficits Decreased activity  tolerance;Decreased range of motion;Decreased strength;Increased muscle spasms;Impaired flexibility;Postural dysfunction;Improper body mechanics;Pain   Rehab Potential Good   PT Frequency 2x / week   PT Duration 8 weeks   PT Treatment/Interventions ADLs/Self Care Home Management;Cryotherapy;Electrical Stimulation;Moist Heat;Therapeutic exercise;Therapeutic activities;Ultrasound;Patient/family education;Manual techniques   PT Next Visit Plan Get report from MD         Problem List Patient Active Problem List   Diagnosis Date Noted  . Thyroid nodule 01/18/2014  . PIH (pregnancy induced hypertension) 09/14/2013  . NSVD (normal spontaneous vaginal delivery) 09/14/2013  . Hidradenitis axillaris 11/21/2011  . Chronic cholecystitis with calculus 04/15/2011    Scot Jun, PTA  06/13/2015, 10:10 AM  [place], [place], [address] Phone: [phone]   Fax:  [phone]  Name: Yvonne Fisher MRN: [id number] Date of Birth: August 04, 1975

## 2015-06-26 ENCOUNTER — Ambulatory Visit: Payer: No Typology Code available for payment source | Admitting: Physical Therapy

## 2015-06-26 ENCOUNTER — Encounter: Payer: Self-pay | Admitting: Physical Therapy

## 2015-06-26 DIAGNOSIS — M25512 Pain in left shoulder: Secondary | ICD-10-CM

## 2015-06-26 NOTE — Therapy (Signed)
[place], [place], [address] Phone: [phone]   Fax:  [phone]  Physical Therapy Treatment  Patient Details  Name: Yvonne Fisher MRN: [id number] Date of Birth: 06-01-1975 Referring Provider: Reginia Forts  Encounter Date: 06/26/2015      PT End of Session - 06/26/15 1510    Visit Number 16   Date for PT Re-Evaluation 06/20/15   PT Start Time 1434   PT Stop Time 1525   PT Time Calculation (min) 51 min   Activity Tolerance Patient tolerated treatment well   Behavior During Therapy [facility] for tasks assessed/performed      Past Medical History  Diagnosis Date  . Arthritis     right elbow   . Hidradenitis 11/2011    left axilla  . Rash 12/01/2011    bilat. axilla  . Pregnancy induced hypertension 2015    "still keeping eye on it" (01/18/2014)  . Thyroid nodule   . GERD (gastroesophageal reflux disease)   . Diabetes ([place])   . Gestational diabetes     diet controlled    Past Surgical History  Procedure Laterality Date  . Elbow arthroscopy  1997    right elbow  . Cholecystectomy  04/15/2011    Procedure: LAPAROSCOPIC CHOLECYSTECTOMY WITH INTRAOPERATIVE CHOLANGIOGRAM;  Surgeon: Adin Hector, MD;  [facility];  Service: General;  Laterality: N/A;  laparoscopic cholecystectomy with cholangiogram  . Hydradenitis excision  12/05/2011    Procedure: EXCISION HYDRADENITIS AXILLA;  Surgeon: Harl Bowie, MD;  Location: [place];  Service: General;  Laterality: Left;  wide excision hidradenitis left axilla  . Thyroid lobectomy Left 01/18/2014  . Thyroidectomy Left 01/18/2014    Procedure: LEFT THYROID LOBECTOMY;  Surgeon: Coralie Keens, MD;  Location: [facility];  Service: General;  Laterality: Left;    There were no vitals filed for this visit.  Visit Diagnosis:  Left shoulder pain      Subjective Assessment - 06/26/15 1436    Subjective Pt reports that she has  returned to work full time. Pt stated that when she was at work her L shoulder hurting because she was not use to it. Pt stated that the MD wanted her to return to therapy    Currently in Pain? Yes   Pain Score 1             [facility] PT Assessment - 06/26/15 0001    AROM   Overall AROM Comments Cervical and shoulder ROM WNL   Strength   Overall Strength Comments 4/5 with pain in the left upper trap and neck                     [facility] Adult PT Treatment/Exercise - 06/26/15 0001    Neck Exercises: Machines for Strengthening   UBE (Upper Arm Bike) 3 frd/3 back L 4   Cybex Row 35# 2 sets 15 reps    Cybex Chest Press 25# 3 sets 10 with serratus   Other Machines for Strengthening lat pull 35#  2 sets 15   Shoulder Exercises: Standing   Flexion Weights;15 reps;Both  2 sets    Shoulder Flexion Weight (lbs) 3   ABduction Weights;15 reps  2 sets    Shoulder ABduction Weight (lbs) 2   Extension 20 reps;Theraband   Theraband Level (Shoulder Extension) Level 3 (Green)   Other Standing Exercises OHP #5 2x10; Bicep curle #5 x20  Modalities   Modalities Moist Heat;Electrical Stimulation   Moist Heat Therapy   Number Minutes Moist Heat 15 Minutes   Moist Heat Location Shoulder   Electrical Stimulation   Electrical Stimulation Location left cervical and upper trap area   Electrical Stimulation Action IFC   Electrical Stimulation Parameters tolerance    Electrical Stimulation Goals Pain                  PT Short Term Goals - 04/24/15 0946    PT SHORT TERM GOAL #1   Title independent with initial HEP   Status Achieved           PT Long Term Goals - 05/31/15 1147    PT LONG TERM GOAL #1   Title understand proper posture and body mechanics   Status Partially Met               Plan - 06/26/15 1511    Clinical Impression Statement Pt 5 min late for PT session. Pt ROM and Strength WFL. Completed all exercises but reports pain in anterior L shoulder  after increased activity with LUE use.    Pt will benefit from skilled therapeutic intervention in order to improve on the following deficits Decreased activity tolerance;Decreased range of motion;Decreased strength;Increased muscle spasms;Impaired flexibility;Postural dysfunction;Improper body mechanics;Pain   Rehab Potential Good   PT Frequency 2x / week   PT Duration 8 weeks   PT Treatment/Interventions ADLs/Self Care Home Management;Cryotherapy;Electrical Stimulation;Moist Heat;Therapeutic exercise;Therapeutic activities;Ultrasound;Patient/family education;Manual techniques   PT Next Visit Plan assess Tx.         Problem List Patient Active Problem List   Diagnosis Date Noted  . Thyroid nodule 01/18/2014  . PIH (pregnancy induced hypertension) 09/14/2013  . NSVD (normal spontaneous vaginal delivery) 09/14/2013  . Hidradenitis axillaris 11/21/2011  . Chronic cholecystitis with calculus 04/15/2011    Scot Jun, PTA  06/26/2015, 3:14 PM  [place], [place], [address] Phone: [phone]   Fax:  [phone]  Name: Yvonne Fisher MRN: [id number] Date of Birth: September 27, 1975

## 2015-06-27 ENCOUNTER — Encounter: Payer: 59 | Admitting: Physical Therapy

## 2015-07-05 ENCOUNTER — Ambulatory Visit: Payer: No Typology Code available for payment source | Admitting: Physical Therapy

## 2015-07-12 ENCOUNTER — Ambulatory Visit: Payer: No Typology Code available for payment source | Admitting: Physical Therapy

## 2015-07-18 ENCOUNTER — Encounter: Payer: Self-pay | Admitting: Physical Therapy

## 2015-07-18 ENCOUNTER — Ambulatory Visit: Payer: No Typology Code available for payment source | Attending: Family Medicine | Admitting: Physical Therapy

## 2015-07-18 DIAGNOSIS — M6248 Contracture of muscle, other site: Secondary | ICD-10-CM | POA: Insufficient documentation

## 2015-07-18 DIAGNOSIS — M62838 Other muscle spasm: Secondary | ICD-10-CM

## 2015-07-18 DIAGNOSIS — M25512 Pain in left shoulder: Secondary | ICD-10-CM | POA: Insufficient documentation

## 2015-07-18 DIAGNOSIS — M542 Cervicalgia: Secondary | ICD-10-CM | POA: Diagnosis present

## 2015-07-18 NOTE — Therapy (Signed)
[place], [place], [address] Phone: [phone]   Fax:  [phone]  Physical Therapy Treatment  Patient Details  Name: Yvonne Fisher MRN: [id number] Date of Birth: 1975-10-13 Referring Provider: Reginia Forts  Encounter Date: 07/18/2015      PT End of Session - 07/18/15 1329    Visit Number 17   Date for PT Re-Evaluation 07/18/15   PT Start Time 1300   PT Stop Time 1329   PT Time Calculation (min) 29 min   Activity Tolerance Patient tolerated treatment well   Behavior During Therapy [facility] for tasks assessed/performed      Past Medical History  Diagnosis Date  . Arthritis     right elbow   . Hidradenitis 11/2011    left axilla  . Rash 12/01/2011    bilat. axilla  . Pregnancy induced hypertension 2015    "still keeping eye on it" (01/18/2014)  . Thyroid nodule   . GERD (gastroesophageal reflux disease)   . Diabetes ([place])   . Gestational diabetes     diet controlled    Past Surgical History  Procedure Laterality Date  . Elbow arthroscopy  1997    right elbow  . Cholecystectomy  04/15/2011    Procedure: LAPAROSCOPIC CHOLECYSTECTOMY WITH INTRAOPERATIVE CHOLANGIOGRAM;  Surgeon: Adin Hector, MD;  [facility];  Service: General;  Laterality: N/A;  laparoscopic cholecystectomy with cholangiogram  . Hydradenitis excision  12/05/2011    Procedure: EXCISION HYDRADENITIS AXILLA;  Surgeon: Harl Bowie, MD;  Location: [facility] [place];  Service: General;  Laterality: Left;  wide excision hidradenitis left axilla  . Thyroid lobectomy Left 01/18/2014  . Thyroidectomy Left 01/18/2014    Procedure: LEFT THYROID LOBECTOMY;  Surgeon: Coralie Keens, MD;  Location: [place];  Service: General;  Laterality: Left;    There were no vitals filed for this visit.  Visit Diagnosis:  Left shoulder pain  Neck pain  Muscle spasms of head and/or neck      Subjective Assessment -  07/18/15 1259    Subjective "Im good"   Currently in Pain? No/denies   Pain Score 0-No pain                         [facility] Adult PT Treatment/Exercise - 07/18/15 0001    Neck Exercises: Machines for Strengthening   UBE (Upper Arm Bike) 3 frd/3 back L 4   Cybex Row 35# 2 sets 15 reps    Cybex Chest Press 25# 3 sets 10 with serratus   Other Machines for Strengthening Elipical I10 R5 x58mn   Other Machines for Strengthening lat pull 35#  2 sets 15                  PT Short Term Goals - 04/24/15 0946    PT SHORT TERM GOAL #1   Title independent with initial HEP   Status Achieved           PT Long Term Goals - 07/18/15 1303    PT LONG TERM GOAL #1   Title understand proper posture and body mechanics   Status Achieved   PT LONG TERM GOAL #5   Title lift her child without pain >4/10   Status Achieved               Plan - 07/18/15 1329    Clinical Impression Statement Pt has return to work  full time met all goals and pleased with her functional status.   Pt will benefit from skilled therapeutic intervention in order to improve on the following deficits Decreased activity tolerance;Decreased range of motion;Decreased strength;Increased muscle spasms;Impaired flexibility;Postural dysfunction;Improper body mechanics;Pain   PT Next Visit Plan D/C PT         Problem List Patient Active Problem List   Diagnosis Date Noted  . Thyroid nodule 01/18/2014  . PIH (pregnancy induced hypertension) 09/14/2013  . NSVD (normal spontaneous vaginal delivery) 09/14/2013  . Hidradenitis axillaris 11/21/2011  . Chronic cholecystitis with calculus 04/15/2011    PHYSICAL THERAPY DISCHARGE SUMMARY  Visits from Start of Care: 17   Plan: Patient agrees to discharge.  Patient goals were met. Patient is being discharged due to meeting the stated rehab goals.  ?????       Scot Jun, PTA  07/18/2015, 1:32 PM  [place], [place], [address] Phone: [phone]   Fax:  [phone]  Name: Yvonne Fisher MRN: [id number] Date of Birth: 12/06/1975

## 2015-07-24 ENCOUNTER — Encounter: Payer: 59 | Admitting: Physical Therapy

## 2015-09-11 ENCOUNTER — Ambulatory Visit (INDEPENDENT_AMBULATORY_CARE_PROVIDER_SITE_OTHER): Payer: 59 | Admitting: Family Medicine

## 2015-09-11 ENCOUNTER — Ambulatory Visit (INDEPENDENT_AMBULATORY_CARE_PROVIDER_SITE_OTHER): Payer: 59

## 2015-09-11 ENCOUNTER — Encounter: Payer: Self-pay | Admitting: Family Medicine

## 2015-09-11 VITALS — BP 149/89 | HR 84 | Temp 98.1°F | Resp 16 | Ht 69.0 in | Wt 245.0 lb

## 2015-09-11 DIAGNOSIS — E119 Type 2 diabetes mellitus without complications: Secondary | ICD-10-CM

## 2015-09-11 DIAGNOSIS — R0600 Dyspnea, unspecified: Secondary | ICD-10-CM

## 2015-09-11 DIAGNOSIS — R030 Elevated blood-pressure reading, without diagnosis of hypertension: Secondary | ICD-10-CM

## 2015-09-11 DIAGNOSIS — IMO0001 Reserved for inherently not codable concepts without codable children: Secondary | ICD-10-CM

## 2015-09-11 LAB — CBC WITH DIFFERENTIAL/PLATELET
BASOS PCT: 0 %
Basophils Absolute: 0 cells/uL (ref 0–200)
Eosinophils Absolute: 98 cells/uL (ref 15–500)
Eosinophils Relative: 2 %
HEMATOCRIT: 40.2 % (ref 35.0–45.0)
Hemoglobin: 13.4 g/dL (ref 11.7–15.5)
LYMPHS PCT: 65 %
Lymphs Abs: 3185 cells/uL (ref 850–3900)
MCH: 28.4 pg (ref 27.0–33.0)
MCHC: 33.3 g/dL (ref 32.0–36.0)
MCV: 85.2 fL (ref 80.0–100.0)
MONOS PCT: 7 %
MPV: 9.8 fL (ref 7.5–12.5)
Monocytes Absolute: 343 cells/uL (ref 200–950)
NEUTROS PCT: 26 %
Neutro Abs: 1274 cells/uL — ABNORMAL LOW (ref 1500–7800)
PLATELETS: 296 10*3/uL (ref 140–400)
RBC: 4.72 MIL/uL (ref 3.80–5.10)
RDW: 13.9 % (ref 11.0–15.0)
WBC: 4.9 10*3/uL (ref 3.8–10.8)

## 2015-09-11 LAB — POCT GLYCOSYLATED HEMOGLOBIN (HGB A1C): Hemoglobin A1C: 7.6

## 2015-09-11 LAB — GLUCOSE, POCT (MANUAL RESULT ENTRY): POC GLUCOSE: 103 mg/dL — AB (ref 70–99)

## 2015-09-11 MED ORDER — METFORMIN HCL 500 MG PO TABS: ORAL_TABLET | ORAL | Status: DC

## 2015-09-11 NOTE — Progress Notes (Signed)
Subjective:    Patient ID: Yvonne Fisher, female    DOB: 1976/02/18, 40 y.o.   MRN: [id number]  09/11/2015  Diabetes   HPI This 40 y.o. female presents for evaluation of diabetes.  Patient reports good compliance with medication, good tolerance to medication, and good symptom control.  Forgets Metformin sometimes; usually 1-2 times per week.  Not checking sugars.  Has been jogging some; gets short of breath; just started running; doing several different things.  Stays busy but no formal exercise for a while.   B: mcmuffin, egg, cheese, ham, milk or grits, 1 egg with cheese B: kicks cereal L: salmon croquette.  Or hot dog, corn and green bean mixed. Snack: skips Supper: salmon croquette, turnips, water Just changed diet and decreased portion controls.    2.  DOE: no asthma; a lot of things happened when had surgery.  Last exercise formal years ago.  No regular exercise. No chest pain.  No wheezing; no coughing.  No rhinorrhea, nasal congestion, sore throat, sneezing.       Review of Systems  Constitutional: Negative for fever, chills, diaphoresis and fatigue.  HENT: Negative for congestion, postnasal drip, rhinorrhea, sneezing and sore throat.   Eyes: Negative for visual disturbance.  Respiratory: Positive for shortness of breath. Negative for cough and wheezing.   Cardiovascular: Negative for chest pain, palpitations and leg swelling.  Gastrointestinal: Negative for nausea, vomiting, abdominal pain, diarrhea and constipation.  Endocrine: Negative for cold intolerance, heat intolerance, polydipsia, polyphagia and polyuria.  Neurological: Negative for dizziness, tremors, seizures, syncope, facial asymmetry, speech difficulty, weakness, light-headedness, numbness and headaches.    Past Medical History  Diagnosis Date  . Arthritis     right elbow   . Hidradenitis 11/2011    left axilla  . Rash 12/01/2011    bilat. axilla  . Pregnancy induced hypertension 2015    "still keeping  eye on it" (01/18/2014)  . Thyroid nodule   . GERD (gastroesophageal reflux disease)   . Diabetes ([place])   . Gestational diabetes     diet controlled   Past Surgical History  Procedure Laterality Date  . Elbow arthroscopy  1997    right elbow  . Cholecystectomy  04/15/2011    Procedure: LAPAROSCOPIC CHOLECYSTECTOMY WITH INTRAOPERATIVE CHOLANGIOGRAM;  Surgeon: Adin Hector, MD;  Location: [facility];  Service: General;  Laterality: N/A;  laparoscopic cholecystectomy with cholangiogram  . Hydradenitis excision  12/05/2011    Procedure: EXCISION HYDRADENITIS AXILLA;  Surgeon: Harl Bowie, MD;  Location: [facility];  Service: General;  Laterality: Left;  wide excision hidradenitis left axilla  . Thyroid lobectomy Left 01/18/2014  . Thyroidectomy Left 01/18/2014    Procedure: LEFT THYROID LOBECTOMY;  Surgeon: Coralie Keens, MD;  Location: [facility];  Service: General;  Laterality: Left;   Allergies  Allergen Reactions  . Adhesive [Tape] Other (See Comments)    TEARS SKIN  . Prevacid [Lansoprazole] Hives   Current Outpatient Prescriptions  Medication Sig Dispense Refill  . metFORMIN (GLUCOPHAGE) 500 MG tablet Take 1 tablet three times daily with meals. 270 tablet 1   No current facility-administered medications for this visit.   Social History   Social History  . Marital Status: Single    Spouse Name: N/A  . Number of Children: 2  . Years of Education: N/A   Occupational History  .      order processor   Social History Main Topics  . Smoking status: Never Smoker   .  Smokeless tobacco: Never Used  . Alcohol Use: Yes     Comment: 01/18/2014 "might have a drink @ birthday party or holidays; not all the time"  . Drug Use: No  . Sexual Activity: No   Other Topics Concern  . Not on file   Social History Narrative   Marital status: single; dating seriously x 10 years; happy; no abuse.      Children:  (20 yo son, 55 week old boy).      Lives: with 2 sons,  boyfriend.      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 Hypertension Mother   . Multiple sclerosis Mother   . Diabetes Father   . Heart disease Father 70    AMI       Objective:    BP 149/89 mmHg  Pulse 84  Temp(Src) 98.1 F (36.7 C) (Oral)  Resp 16  Ht 5\' 9"  (1.753 m)  Wt 245 lb (111.131 kg)  BMI 36.16 kg/m2  LMP 09/11/2015 Physical Exam  Constitutional: She is oriented to person, place, and time. She appears well-developed and well-nourished. No distress.  HENT:  Head: Normocephalic and atraumatic.  Right Ear: External ear normal.  Left Ear: External ear normal.  Nose: Nose normal.  Mouth/Throat: Oropharynx is clear and moist.  Eyes: Conjunctivae and EOM are normal. Pupils are equal, round, and reactive to light.  Neck: Normal range of motion. Neck supple. Carotid bruit is not present. No thyromegaly present.  Cardiovascular: Normal rate, regular rhythm, normal heart sounds and intact distal pulses.  Exam reveals no gallop and no friction rub.   No murmur heard. Pulmonary/Chest: Effort normal and breath sounds normal. She has no wheezes. She has no rales.  Abdominal: Soft. Bowel sounds are normal. She exhibits no distension and no mass. There is no tenderness. There is no rebound and no guarding.  Lymphadenopathy:    She has no cervical adenopathy.  Neurological: She is alert and oriented to person, place, and time. No cranial nerve deficit.  Skin: Skin is warm and dry. No rash noted. She is not diaphoretic. No erythema. No pallor.  Psychiatric: She has a normal mood and affect. Her behavior is normal.        Assessment & Plan:   1. Type 2 diabetes mellitus without complication, without long-term current use of insulin ([place])   2. Dyspnea   3. Blood pressure elevated     Orders Placed This Encounter    Procedures  . DG Chest 2 View    Standing Status: Future     Number of Occurrences: 1     Standing Expiration Date: 09/10/2016    Order Specific Question:  Reason for Exam (SYMPTOM  OR DIAGNOSIS REQUIRED)    Answer:  dyspnea    Order Specific Question:  Is the patient pregnant?    Answer:  No    Order Specific Question:  Preferred imaging location?    Answer:  External  . CBC with Differential/Platelet  . Comprehensive metabolic panel  . TSH  . POCT glucose (manual entry)  . POCT glycosylated hemoglobin (Hb A1C)  . EKG 12-Lead   Meds ordered this encounter  Medications  . metFORMIN (GLUCOPHAGE) 500 MG tablet    Sig: Take 1 tablet three times daily with meals.  Dispense:  270 tablet    Refill:  1    Return in about 6 months (around 03/13/2016) for recheck diabetes.    Reina Wilton Elayne Guerin, M.D. [facility] [address] [place], [place]  [address] [phone] phone [phone] fax

## 2015-09-11 NOTE — Patient Instructions (Addendum)
   IF you received an x-ray today, you will receive an invoice from [facility]. Please contact  Radiology at [phone] with questions or concerns regarding your invoice.   IF you received labwork today, you will receive an invoice from [facility]. Please contact [facility] at [phone] with questions or concerns regarding your invoice.   Our billing staff will not be able to assist you with questions regarding bills from these companies.  You will be contacted with the lab results as soon as they are available. The fastest way to get your results is to activate your My Chart account. Instructions are located on the last page of this paperwork. If you have not heard from us regarding the results in 2 weeks, please contact this office.     Diabetes and Exercise Exercising regularly is important. It is not just about losing weight. It has many health benefits, such as:  Improving your overall fitness, flexibility, and endurance.  Increasing your bone density.  Helping with weight control.  Decreasing your body fat.  Increasing your muscle strength.  Reducing stress and tension.  Improving your overall health. People with diabetes who exercise gain additional benefits because exercise:  Reduces appetite.  Improves the body's use of blood sugar (glucose).  Helps lower or control blood glucose.  Decreases blood pressure.  Helps control blood lipids (such as cholesterol and triglycerides).  Improves the body's use of the hormone insulin by:  Increasing the body's insulin sensitivity.  Reducing the body's insulin needs.  Decreases the risk for heart disease because exercising:  Lowers cholesterol and triglycerides levels.  Increases the levels of good cholesterol (such as high-density lipoproteins [HDL]) in the body.  Lowers blood glucose levels. YOUR ACTIVITY PLAN  Choose an activity that you enjoy, and set realistic  goals. To exercise safely, you should begin practicing any new physical activity slowly, and gradually increase the intensity of the exercise over time. Your health care provider or diabetes educator can help create an activity plan that works for you. General recommendations include:  Encouraging children to engage in at least 60 minutes of physical activity each day.  Stretching and performing strength training exercises, such as yoga or weight lifting, at least 2 times per week.  Performing a total of at least 150 minutes of moderate-intensity exercise each week, such as brisk walking or water aerobics.  Exercising at least 3 days per week, making sure you allow no more than 2 consecutive days to pass without exercising.  Avoiding long periods of inactivity (90 minutes or more). When you have to spend an extended period of time sitting down, take frequent breaks to walk or stretch. RECOMMENDATIONS FOR EXERCISING WITH TYPE 1 OR TYPE 2 DIABETES   Check your blood glucose before exercising. If blood glucose levels are greater than 240 mg/dL, check for urine ketones. Do not exercise if ketones are present.  Avoid injecting insulin into areas of the body that are going to be exercised. For example, avoid injecting insulin into:  The arms when playing tennis.  The legs when jogging.  Keep a record of:  Food intake before and after you exercise.  Expected peak times of insulin action.  Blood glucose levels before and after you exercise.  The type and amount of exercise you have done.  Review your records with your health care provider. Your health care provider will help you to develop guidelines for adjusting food intake and insulin amounts before and after exercising.    If you take insulin or oral hypoglycemic agents, watch for signs and symptoms of hypoglycemia. They include:  Dizziness.  Shaking.  Sweating.  Chills.  Confusion.  Drink plenty of water while you exercise to  prevent dehydration or heat stroke. Body water is lost during exercise and must be replaced.  Talk to your health care provider before starting an exercise program to make sure it is safe for you. Remember, almost any type of activity is better than none.   This information is not intended to replace advice given to you by your health care provider. Make sure you discuss any questions you have with your health care provider.   Document Released: 07/05/2003 Document Revised: 08/29/2014 Document Reviewed: 09/21/2012 Elsevier Interactive Patient Education 2016 Elsevier Inc.  

## 2015-09-12 LAB — COMPREHENSIVE METABOLIC PANEL
ALT: 23 U/L (ref 6–29)
AST: 17 U/L (ref 10–30)
Albumin: 4 g/dL (ref 3.6–5.1)
Alkaline Phosphatase: 79 U/L (ref 33–115)
BUN: 13 mg/dL (ref 7–25)
CALCIUM: 8.8 mg/dL (ref 8.6–10.2)
CHLORIDE: 102 mmol/L (ref 98–110)
CO2: 24 mmol/L (ref 20–31)
Creat: 0.83 mg/dL (ref 0.50–1.10)
Glucose, Bld: 104 mg/dL — ABNORMAL HIGH (ref 65–99)
POTASSIUM: 3.7 mmol/L (ref 3.5–5.3)
Sodium: 136 mmol/L (ref 135–146)
Total Bilirubin: 0.4 mg/dL (ref 0.2–1.2)
Total Protein: 6.9 g/dL (ref 6.1–8.1)

## 2015-09-12 LAB — TSH: TSH: 0.71 m[IU]/L

## 2015-10-10 ENCOUNTER — Encounter: Payer: Self-pay | Admitting: Family Medicine

## 2015-10-26 ENCOUNTER — Telehealth: Payer: Self-pay

## 2015-10-26 NOTE — Telephone Encounter (Signed)
Patient is calling to request a refill for metformin. [place]

## 2015-10-29 MED ORDER — METFORMIN HCL 500 MG PO TABS: ORAL_TABLET | ORAL | Status: DC

## 2015-10-29 NOTE — Telephone Encounter (Signed)
Sent in RFs to [facility] and notified pt. She stated she has been out of her meds for several days. I also sent in a 10 day emergency supply to local pharm and advised pt to call pharm to see if he can get the ins to cover this short term local Rx. Pt agreed.

## 2016-02-16 ENCOUNTER — Ambulatory Visit (HOSPITAL_COMMUNITY)
Admission: EM | Admit: 2016-02-16 | Discharge: 2016-02-16 | Disposition: A | Discharge status: Home / Self Care | Payer: 59 | Attending: Family Medicine | Admitting: Family Medicine

## 2016-02-16 ENCOUNTER — Encounter (HOSPITAL_COMMUNITY): Payer: Self-pay | Admitting: Family Medicine

## 2016-02-16 ENCOUNTER — Ambulatory Visit (INDEPENDENT_AMBULATORY_CARE_PROVIDER_SITE_OTHER): Payer: 59

## 2016-02-16 DIAGNOSIS — J069 Acute upper respiratory infection, unspecified: Secondary | ICD-10-CM

## 2016-02-16 MED ORDER — GUAIFENESIN-CODEINE 100-10 MG/5ML PO SYRP: 10.0000 mL | ORAL_SOLUTION | Freq: Four times a day (QID) | ORAL | 0 refills | Status: DC | PRN

## 2016-02-16 MED ORDER — IPRATROPIUM BROMIDE 0.06 % NA SOLN: 2.0000 | Freq: Four times a day (QID) | NASAL | 1 refills | Status: DC

## 2016-02-16 NOTE — Discharge Instructions (Signed)
Drink plenty of fluids as discussed, use medicine as prescribed, and mucinex or delsym for cough. Return or see your doctor if further problems °

## 2016-02-16 NOTE — ED Provider Notes (Signed)
[facility]    CSN: [id number] Arrival date & time: 02/16/16  [place]     History   Chief Complaint Chief Complaint  Patient presents with  . Cough  . Shortness of Breath    HPI Yvonne Fisher is a 40 y.o. female.   The history is provided by the patient.  Cough  Cough characteristics:  Non-productive and hacking Severity:  Moderate Onset quality:  Gradual Duration:  2 weeks Progression:  Unchanged Chronicity:  New Smoker: no   Context: upper respiratory infection and weather changes   Relieved by:  None tried Worsened by:  Nothing Ineffective treatments:  None tried Associated symptoms: rhinorrhea     Past Medical History:  Diagnosis Date  . Arthritis    right elbow   . Diabetes ([place])   . GERD (gastroesophageal reflux disease)   . Gestational diabetes    diet controlled  . Hidradenitis 11/2011   left axilla  . Pregnancy induced hypertension 2015   "still keeping eye on it" (01/18/2014)  . Rash 12/01/2011   bilat. axilla  . Thyroid nodule     Patient Active Problem List   Diagnosis Date Noted  . Thyroid nodule 01/18/2014  . PIH (pregnancy induced hypertension) 09/14/2013  . NSVD (normal spontaneous vaginal delivery) 09/14/2013  . Hidradenitis axillaris 11/21/2011  . Chronic cholecystitis with calculus 04/15/2011    Past Surgical History:  Procedure Laterality Date  . CHOLECYSTECTOMY  04/15/2011   Procedure: LAPAROSCOPIC CHOLECYSTECTOMY WITH INTRAOPERATIVE CHOLANGIOGRAM;  Surgeon: Adin Hector, MD;  Location: [facility];  Service: General;  Laterality: N/A;  laparoscopic cholecystectomy with cholangiogram  . ELBOW ARTHROSCOPY  1997   right elbow  . HYDRADENITIS EXCISION  12/05/2011   Procedure: EXCISION HYDRADENITIS AXILLA;  Surgeon: Harl Bowie, MD;  Location: [facility];  Service: General;  Laterality: Left;  wide excision hidradenitis left axilla  . THYROID LOBECTOMY Left 01/18/2014  . THYROIDECTOMY Left 01/18/2014   Procedure: LEFT THYROID LOBECTOMY;  Surgeon: Coralie Keens, MD;  Location: [facility];  Service: General;  Laterality: Left;    OB History    Gravida Para Term Preterm AB Living   4 2 2  0 2 2   SAB TAB Ectopic Multiple Live Births   2 0 0 0 2       Home Medications    Prior to Admission medications   Medication Sig Start Date End Date Taking? Authorizing Provider  metFORMIN (GLUCOPHAGE) 500 MG tablet Take 1 tablet three times daily with meals. 10/29/15   Wardell Honour, MD    Family History Family History  Problem Relation Age of Onset  . Hypertension Mother   . Multiple sclerosis Mother   . Diabetes Father   . Heart disease Father 73    AMI    Social History Social History  Substance Use Topics  . Smoking status: Never Smoker  . Smokeless tobacco: Never Used  . Alcohol use Yes     Comment: 01/18/2014 "might have a drink @ birthday party or holidays; not all the time"     Allergies   Adhesive [tape] and Prevacid [lansoprazole]   Review of Systems Review of Systems  HENT: Positive for rhinorrhea.   Respiratory: Positive for cough.      Physical Exam Triage Vital Signs ED Triage Vitals [02/16/16 1902]  Enc Vitals Group     BP 166/91     Pulse Rate 96     Resp 18     Temp  98.6 F (37 C)     Temp src      SpO2 98 %     Weight      Height      Head Circumference      Peak Flow      Pain Score      Pain Loc      Pain Edu?      Excl. in [place]?    No data found.   Updated Vital Signs BP 166/91   Pulse 96   Temp 98.6 F (37 C)   Resp 18   SpO2 98%   Visual Acuity Right Eye Distance:   Left Eye Distance:   Bilateral Distance:    Right Eye Near:   Left Eye Near:    Bilateral Near:     Physical Exam   UC Treatments / Results  Labs (all labs ordered are listed, but only abnormal results are displayed) Labs Reviewed - No data to display  EKG  EKG Interpretation None       Radiology No results found. X-rays reviewed and report per  radiologist.  Procedures Procedures (including critical care time)  Medications Ordered in UC Medications - No data to display   Initial Impression / Assessment and Plan / UC Course  I have reviewed the triage vital signs and the nursing notes.  Pertinent labs & imaging results that were available during my care of the patient were reviewed by me and considered in my medical decision making (see chart for details).  Clinical Course      Final Clinical Impressions(s) / UC Diagnoses   Final diagnoses:  None    New Prescriptions New Prescriptions   No medications on file     [name], MD 02/16/16 1956

## 2016-02-16 NOTE — ED Triage Notes (Signed)
Pt here for over 2 weeks of cough, nasal congestion, loss of voice. sts SOB when taking a deep breath and coughing.

## 2016-03-14 ENCOUNTER — Telehealth: Payer: Self-pay

## 2016-03-14 NOTE — Telephone Encounter (Signed)
Patient got a call from [place] earlier and is returning our call.  Patient is unsure if it was from a lab/prescription/clinical.  Her voicemail does not work.  If she doesn't answer her cell, try her work number.  [phone] Work: [phone]

## 2016-03-15 NOTE — Telephone Encounter (Signed)
She probably got a appt reminder call.  No answer and did not leave message since vm does not work.

## 2016-03-18 ENCOUNTER — Ambulatory Visit (INDEPENDENT_AMBULATORY_CARE_PROVIDER_SITE_OTHER): Payer: 59 | Admitting: Family Medicine

## 2016-03-18 ENCOUNTER — Encounter: Payer: Self-pay | Admitting: Family Medicine

## 2016-03-18 VITALS — BP 130/90 | HR 92 | Temp 98.8°F | Resp 16 | Ht 69.5 in | Wt 236.4 lb

## 2016-03-18 DIAGNOSIS — E010 Iodine-deficiency related diffuse (endemic) goiter: Secondary | ICD-10-CM

## 2016-03-18 DIAGNOSIS — E119 Type 2 diabetes mellitus without complications: Secondary | ICD-10-CM | POA: Diagnosis not present

## 2016-03-18 DIAGNOSIS — Z23 Encounter for immunization: Secondary | ICD-10-CM

## 2016-03-18 DIAGNOSIS — E6609 Other obesity due to excess calories: Secondary | ICD-10-CM | POA: Diagnosis not present

## 2016-03-18 DIAGNOSIS — Z6834 Body mass index (BMI) 34.0-34.9, adult: Secondary | ICD-10-CM | POA: Diagnosis not present

## 2016-03-18 LAB — LIPID PANEL
CHOL/HDL RATIO: 3.1 ratio (ref <5.0)
Cholesterol: 184 mg/dL (ref <200)
HDL: 59 mg/dL (ref >50)
LDL CALC: 111 mg/dL — AB (ref <100)
TRIGLYCERIDES: 68 mg/dL (ref <150)
VLDL: 14 mg/dL (ref <30)

## 2016-03-18 LAB — CBC WITH DIFFERENTIAL/PLATELET
BASOS ABS: 0 {cells}/uL (ref 0–200)
Basophils Relative: 0 %
EOS ABS: 100 {cells}/uL (ref 15–500)
EOS PCT: 2 %
HCT: 41 % (ref 35.0–45.0)
Hemoglobin: 13.8 g/dL (ref 11.7–15.5)
LYMPHS PCT: 59 %
Lymphs Abs: 2950 cells/uL (ref 850–3900)
MCH: 28.7 pg (ref 27.0–33.0)
MCHC: 33.7 g/dL (ref 32.0–36.0)
MCV: 85.2 fL (ref 80.0–100.0)
MONOS PCT: 9 %
MPV: 9.8 fL (ref 7.5–12.5)
Monocytes Absolute: 450 cells/uL (ref 200–950)
Neutro Abs: 1500 cells/uL (ref 1500–7800)
Neutrophils Relative %: 30 %
PLATELETS: 294 10*3/uL (ref 140–400)
RBC: 4.81 MIL/uL (ref 3.80–5.10)
RDW: 13.7 % (ref 11.0–15.0)
WBC: 5 10*3/uL (ref 3.8–10.8)

## 2016-03-18 LAB — COMPREHENSIVE METABOLIC PANEL
ALT: 20 U/L (ref 6–29)
AST: 18 U/L (ref 10–30)
Albumin: 4.1 g/dL (ref 3.6–5.1)
Alkaline Phosphatase: 83 U/L (ref 33–115)
BUN: 10 mg/dL (ref 7–25)
CHLORIDE: 102 mmol/L (ref 98–110)
CO2: 24 mmol/L (ref 20–31)
Calcium: 9 mg/dL (ref 8.6–10.2)
Creat: 0.77 mg/dL (ref 0.50–1.10)
GLUCOSE: 99 mg/dL (ref 65–99)
POTASSIUM: 4.1 mmol/L (ref 3.5–5.3)
SODIUM: 136 mmol/L (ref 135–146)
Total Bilirubin: 0.6 mg/dL (ref 0.2–1.2)
Total Protein: 7.2 g/dL (ref 6.1–8.1)

## 2016-03-18 LAB — POCT URINALYSIS DIP (MANUAL ENTRY)
BILIRUBIN UA: NEGATIVE
Bilirubin, UA: NEGATIVE
Glucose, UA: NEGATIVE
LEUKOCYTES UA: NEGATIVE
Nitrite, UA: NEGATIVE
PH UA: 5.5
Protein Ur, POC: NEGATIVE
UROBILINOGEN UA: 0.2

## 2016-03-18 MED ORDER — METFORMIN HCL 500 MG PO TABS: ORAL_TABLET | ORAL | 1 refills | Status: DC

## 2016-03-18 NOTE — Patient Instructions (Signed)
     IF you received an x-ray today, you will receive an invoice from [facility]. Please contact [facility] at [phone] with questions or concerns regarding your invoice.   IF you received labwork today, you will receive an invoice from [facility]. Please contact [facility] at [phone] with questions or concerns regarding your invoice.   Our billing staff will not be able to assist you with questions regarding bills from these companies.  You will be contacted with the lab results as soon as they are available. The fastest way to get your results is to activate your My Chart account. Instructions are located on the last page of this paperwork. If you have not heard from us regarding the results in 2 weeks, please contact this office.      

## 2016-03-18 NOTE — Progress Notes (Signed)
Subjective:    Patient ID: Yvonne Fisher, female    DOB: 06/17/1975, 40 y.o.   MRN: [id number]  03/18/2016  Diabetes   HPI This 40 y.o. female presents for evaluation of DMII.  Husband had AMI in 10/2015.  Sugars 120-130s.No formal exercise; only walks steps three flights; weight down 10 pounds.  Taking Metformin bid only; has not taken any today.  Not taking Metformin on weekends.  BP Readings from Last 3 Encounters:  03/18/16 130/90  02/16/16 166/91  09/11/15 (!) 149/89   Wt Readings from Last 3 Encounters:  03/18/16 236 lb 6.4 oz (107.2 kg)  09/11/15 245 lb (111.1 kg)  05/14/15 239 lb 6.4 oz (108.6 kg)   Immunization History  Administered Date(s) Administered  . Influenza Split 04/16/2011  . Influenza,inj,Quad PF,36+ Mos 02/01/2014, 04/04/2015, 03/18/2016  . Pneumococcal Polysaccharide-23 03/18/2016  . Tdap 06/15/2013    Review of Systems  Constitutional: Negative for chills, diaphoresis, fatigue and fever.  Eyes: Negative for visual disturbance.  Respiratory: Negative for cough and shortness of breath.   Cardiovascular: Negative for chest pain, palpitations and leg swelling.  Gastrointestinal: Negative for abdominal pain, constipation, diarrhea, nausea and vomiting.  Endocrine: Negative for cold intolerance, heat intolerance, polydipsia, polyphagia and polyuria.  Neurological: Negative for dizziness, tremors, seizures, syncope, facial asymmetry, speech difficulty, weakness, light-headedness, numbness and headaches.    Past Medical History:  Diagnosis Date  . Arthritis    right elbow   . Diabetes ([place])   . GERD (gastroesophageal reflux disease)   . Gestational diabetes    diet controlled  . Hidradenitis 11/2011   left axilla  . Pregnancy induced hypertension 2015   "still keeping eye on it" (01/18/2014)  . Rash 12/01/2011   bilat. axilla  . Thyroid nodule    Past Surgical History:  Procedure Laterality Date  . CHOLECYSTECTOMY  04/15/2011   Procedure:  LAPAROSCOPIC CHOLECYSTECTOMY WITH INTRAOPERATIVE CHOLANGIOGRAM;  Surgeon: Adin Hector, MD;  Location: [facility];  Service: General;  Laterality: N/A;  laparoscopic cholecystectomy with cholangiogram  . ELBOW ARTHROSCOPY  1997   right elbow  . HYDRADENITIS EXCISION  12/05/2011   Procedure: EXCISION HYDRADENITIS AXILLA;  Surgeon: Harl Bowie, MD;  Location: [place];  Service: General;  Laterality: Left;  wide excision hidradenitis left axilla  . THYROID LOBECTOMY Left 01/18/2014  . THYROIDECTOMY Left 01/18/2014   Procedure: LEFT THYROID LOBECTOMY;  Surgeon: Coralie Keens, MD;  Location: [place];  Service: General;  Laterality: Left;   Allergies  Allergen Reactions  . Adhesive [Tape] Other (See Comments)    TEARS SKIN  . Prevacid [Lansoprazole] Hives   Current Outpatient Prescriptions  Medication Sig Dispense Refill  . metFORMIN (GLUCOPHAGE) 500 MG tablet Take 1 tablet three times daily with meals. 270 tablet 1  . ipratropium (ATROVENT) 0.06 % nasal spray Place 2 sprays into both nostrils 4 (four) times daily. (Patient not taking: Reported on 03/18/2016) 15 mL 1   No current facility-administered medications for this visit.    Social History   Social History  . Marital status: Single    Spouse name: N/A  . Number of children: 2  . Years of education: N/A   Occupational History  .      order processor   Social History Main Topics  . Smoking status: Never Smoker  . Smokeless tobacco: Never Used  . Alcohol use Yes     Comment: 01/18/2014 "might have a drink @ birthday party or holidays; not all  the time"  . Drug use: No  . Sexual activity: No   Other Topics Concern  . Not on file   Social History Narrative   Marital status: single; dating seriously x 10 years; happy; no abuse.      Children:  (5 yo son, 44 week old boy).      Lives: with 2 sons, boyfriend.      Employment:  [employer] x 15 years; work processor; pushing/pulling/walking        Tobacco: none      Alcohol: none      Drug: none   Family History  Problem Relation Age of Onset  . Hypertension Mother   . Multiple sclerosis Mother   . Diabetes Father   . Heart disease Father 56    AMI       Objective:    BP 130/90   Pulse 92   Temp 98.8 F (37.1 C) (Oral)   Resp 16   Ht 5' 9.5" (1.765 m)   Wt 236 lb 6.4 oz (107.2 kg)   LMP 02/18/2016   SpO2 100%   BMI 34.41 kg/m  Physical Exam  Constitutional: She is oriented to person, place, and time. She appears well-developed and well-nourished. No distress.  HENT:  Head: Normocephalic and atraumatic.  Right Ear: External ear normal.  Left Ear: External ear normal.  Nose: Nose normal.  Mouth/Throat: Oropharynx is clear and moist.  Eyes: Conjunctivae and EOM are normal. Pupils are equal, round, and reactive to light.  Neck: Normal range of motion. Neck supple. Carotid bruit is not present. Thyromegaly present.  R thyroid enlargement. Well healed horizontal incision anterior neck.  Cardiovascular: Normal rate, regular rhythm, normal heart sounds and intact distal pulses.  Exam reveals no gallop and no friction rub.   No murmur heard. Pulmonary/Chest: Effort normal and breath sounds normal. She has no wheezes. She has no rales.  Abdominal: Soft. Bowel sounds are normal. She exhibits no distension and no mass. There is no tenderness. There is no rebound and no guarding.  Lymphadenopathy:    She has no cervical adenopathy.  Neurological: She is alert and oriented to person, place, and time. No cranial nerve deficit.  Skin: Skin is warm and dry. No rash noted. She is not diaphoretic. No erythema. No pallor.  Psychiatric: She has a normal mood and affect. Her behavior is normal.    Depression screen [facility] 2/9 03/18/2016 09/11/2015 05/14/2015 04/16/2015 04/09/2015  Decreased Interest 0 0 0 0 0  Down, Depressed, Hopeless 0 0 0 0 0  PHQ - 2 Score 0 0 0 0 0   Fall Risk  03/18/2016 09/11/2015 04/16/2015 04/09/2015  02/12/2015  Falls in the past year? No No No No No       Assessment & Plan:   1. Type 2 diabetes mellitus without complication, without long-term current use of insulin ([place])   2. Thyromegaly   3. Class 1 obesity due to excess calories with serious comorbidity and body mass index (BMI) of 34.0 to 34.9 in adult   4. Needs flu shot   5. Need for prophylactic vaccination against Streptococcus pneumoniae (pneumococcus)    -DMII moderately controlled; obtain labs; refills provided. -refer for thyroid US to evaluate L thyroid gland. -recommend exercise, weight loss, low calorie food choices. -s/p Pneumovax and influenza vaccines.   Orders Placed This Encounter  Procedures  . US THYROID    Wt 137/no needs/uhc/fs pt with epic order     Standing  Status:   Future    Standing Expiration Date:   05/18/2017    Order Specific Question:   Reason for Exam (SYMPTOM  OR DIAGNOSIS REQUIRED)    Answer:   R thyroid enlargement; s/p L thyroidectomy for thyroid nodule    Order Specific Question:   Preferred imaging location?    Answer:   [facility]  . Flu Vaccine QUAD 36+ mos IM  . Pneumococcal polysaccharide vaccine 23-valent greater than or equal to 2yo subcutaneous/IM  . Microalbumin, urine  . CBC with Differential/Platelet  . Comprehensive metabolic panel    Order Specific Question:   Has the patient fasted?    Answer:   Yes  . Hemoglobin A1c  . Lipid panel    Order Specific Question:   Has the patient fasted?    Answer:   Yes  . POCT urinalysis dipstick   Meds ordered this encounter  Medications  . metFORMIN (GLUCOPHAGE) 500 MG tablet    Sig: Take 1 tablet three times daily with meals.    Dispense:  270 tablet    Refill:  1    Return in about 4 months (around 07/16/2016) for recheck diabetes.  Kristi Elayne Guerin, M.D. [facility] [address] [place], [place]  [address] [phone] phone [phone] fax

## 2016-03-18 NOTE — Telephone Encounter (Signed)
Pt coming in for appt today °

## 2016-03-19 LAB — MICROALBUMIN, URINE: MICROALB UR: 1.1 mg/dL

## 2016-03-19 LAB — HEMOGLOBIN A1C
HEMOGLOBIN A1C: 6.9 % — AB (ref <5.7)
Mean Plasma Glucose: 151 mg/dL

## 2016-03-26 DIAGNOSIS — Z6835 Body mass index (BMI) 35.0-35.9, adult: Secondary | ICD-10-CM | POA: Insufficient documentation

## 2016-03-26 DIAGNOSIS — E669 Obesity, unspecified: Secondary | ICD-10-CM

## 2016-03-26 DIAGNOSIS — E119 Type 2 diabetes mellitus without complications: Secondary | ICD-10-CM | POA: Insufficient documentation

## 2016-03-26 DIAGNOSIS — E66812 Obesity, class 2: Secondary | ICD-10-CM | POA: Insufficient documentation

## 2016-04-01 ENCOUNTER — Ambulatory Visit
Admission: RE | Admit: 2016-04-01 | Discharge: 2016-04-01 | Disposition: A | Discharge status: Home / Self Care | Payer: 59 | Source: Ambulatory Visit | Attending: Family Medicine | Admitting: Family Medicine

## 2016-04-01 DIAGNOSIS — E010 Iodine-deficiency related diffuse (endemic) goiter: Secondary | ICD-10-CM

## 2016-04-02 ENCOUNTER — Telehealth: Payer: Self-pay

## 2016-04-02 NOTE — Telephone Encounter (Signed)
Patient had lab work done on 11/21 and would like to know the results. She also had an ultra sound done on 12/5. Please call once reviewed  [phone]

## 2016-04-03 NOTE — Telephone Encounter (Signed)
Routed to Dr. Smith  

## 2016-04-04 ENCOUNTER — Other Ambulatory Visit: Payer: 59

## 2016-04-14 ENCOUNTER — Telehealth: Payer: Self-pay | Admitting: Radiology

## 2016-04-14 NOTE — Telephone Encounter (Signed)
Left message on patient's voicemail; advised that thyroid ultrasound and labs stable. Will call back again later today to provide details.

## 2016-04-14 NOTE — Telephone Encounter (Signed)
Pt called in and states she has called 5 times to inquire about her lab and [place] results. I assured pt that we will review them and call her with results asap. Pt states this is  "ridiculous" to wait almost a month (labs were done on 11/21) for lab results. I offered for pt to speak with Sharee Pimple and pt stated she "doesnt want to get her doctor in trouble".  Please review and contact pt with lab and [place] results.

## 2016-04-14 NOTE — Telephone Encounter (Signed)
Left message on voicemail today to review lab results and thyroid US results.

## 2016-04-15 NOTE — Telephone Encounter (Signed)
Spoke with patient  --- labs are stable; HgbA1c has improved from last visit; cholesterol has increased slightly; thyroid ultrasound stable; R thyroid nodule unchanged.  Recommend repeat thyroid US yearly for five years.

## 2016-06-05 ENCOUNTER — Telehealth: Payer: Self-pay

## 2016-06-05 NOTE — Telephone Encounter (Signed)
Left message for patient to call back and reschedule her appointment from 07/16/16 with Dr Tamala Julian due to her being out of the office that day. Anyone can make her another appointment on Dr Thompson Caul next available day. Thank you!

## 2016-07-16 ENCOUNTER — Ambulatory Visit: Payer: 59 | Admitting: Family Medicine

## 2016-07-18 ENCOUNTER — Encounter: Payer: Self-pay | Admitting: Family Medicine

## 2016-07-23 ENCOUNTER — Ambulatory Visit (INDEPENDENT_AMBULATORY_CARE_PROVIDER_SITE_OTHER): Payer: 59 | Admitting: Family Medicine

## 2016-07-23 ENCOUNTER — Encounter: Payer: Self-pay | Admitting: Family Medicine

## 2016-07-23 VITALS — BP 154/90 | HR 86 | Temp 98.2°F | Resp 18 | Ht 69.5 in | Wt 241.0 lb

## 2016-07-23 DIAGNOSIS — E6609 Other obesity due to excess calories: Secondary | ICD-10-CM | POA: Diagnosis not present

## 2016-07-23 DIAGNOSIS — IMO0001 Reserved for inherently not codable concepts without codable children: Secondary | ICD-10-CM

## 2016-07-23 DIAGNOSIS — Z6835 Body mass index (BMI) 35.0-35.9, adult: Secondary | ICD-10-CM

## 2016-07-23 DIAGNOSIS — E041 Nontoxic single thyroid nodule: Secondary | ICD-10-CM | POA: Diagnosis not present

## 2016-07-23 DIAGNOSIS — E119 Type 2 diabetes mellitus without complications: Secondary | ICD-10-CM | POA: Diagnosis not present

## 2016-07-23 DIAGNOSIS — I10 Essential (primary) hypertension: Secondary | ICD-10-CM | POA: Diagnosis not present

## 2016-07-23 LAB — GLUCOSE, POCT (MANUAL RESULT ENTRY): POC GLUCOSE: 100 mg/dL — AB (ref 70–99)

## 2016-07-23 LAB — POCT URINALYSIS DIP (MANUAL ENTRY)
Bilirubin, UA: NEGATIVE
GLUCOSE UA: NEGATIVE
Ketones, POC UA: NEGATIVE
Leukocytes, UA: NEGATIVE
NITRITE UA: NEGATIVE
PROTEIN UA: NEGATIVE
Spec Grav, UA: 1.015 (ref 1.030–1.035)
Urobilinogen, UA: 0.2 (ref ?–2.0)
pH, UA: 5 (ref 5.0–8.0)

## 2016-07-23 LAB — POCT GLYCOSYLATED HEMOGLOBIN (HGB A1C): HEMOGLOBIN A1C: 7.3

## 2016-07-23 MED ORDER — HYDROCHLOROTHIAZIDE 12.5 MG PO TABS: 12.5000 mg | ORAL_TABLET | Freq: Every day | ORAL | 1 refills | Status: DC

## 2016-07-23 MED ORDER — METFORMIN HCL 1000 MG PO TABS: ORAL_TABLET | ORAL | 3 refills | Status: DC

## 2016-07-23 NOTE — Patient Instructions (Addendum)
   IF you received an x-ray today, you will receive an invoice from [facility]. Please contact [facility] at [phone] with questions or concerns regarding your invoice.   IF you received labwork today, you will receive an invoice from [facility]. Please contact [facility] at [phone] with questions or concerns regarding your invoice.   Our billing staff will not be able to assist you with questions regarding bills from these companies.  You will be contacted with the lab results as soon as they are available. The fastest way to get your results is to activate your My Chart account. Instructions are located on the last page of this paperwork. If you have not heard from us regarding the results in 2 weeks, please contact this office.      Carbohydrate Counting for Diabetes Mellitus, Adult Carbohydrate counting is a method for keeping track of how many carbohydrates you eat. Eating carbohydrates naturally increases the amount of sugar (glucose) in the blood. Counting how many carbohydrates you eat helps keep your blood glucose within normal limits, which helps you manage your diabetes (diabetes mellitus). It is important to know how many carbohydrates you can safely have in each meal. This is different for every person. A diet and nutrition specialist (registered dietitian) can help you make a meal plan and calculate how many carbohydrates you should have at each meal and snack. Carbohydrates are found in the following foods:  Grains, such as breads and cereals.  Dried beans and soy products.  Starchy vegetables, such as potatoes, peas, and corn.  Fruit and fruit juices.  Milk and yogurt.  Sweets and snack foods, such as cake, cookies, candy, chips, and soft drinks. How do I count carbohydrates? There are two ways to count carbohydrates in food. You can use either of the methods or a combination of both. Reading "Nutrition Facts" on packaged food  The  "Nutrition Facts" list is included on the labels of almost all packaged foods and beverages in the [place] It includes:  The serving size.  Information about nutrients in each serving, including the grams (g) of carbohydrate per serving. To use the "Nutrition Facts":  Decide how many servings you will have.  Multiply the number of servings by the number of carbohydrates per serving.  The resulting number is the total amount of carbohydrates that you will be having. Learning standard serving sizes of other foods  When you eat foods containing carbohydrates that are not packaged or do not include "Nutrition Facts" on the label, you need to measure the servings in order to count the amount of carbohydrates:  Measure the foods that you will eat with a food scale or measuring cup, if needed.  Decide how many standard-size servings you will eat.  Multiply the number of servings by 15. Most carbohydrate-rich foods have about 15 g of carbohydrates per serving.  For example, if you eat 8 oz (170 g) of strawberries, you will have eaten 2 servings and 30 g of carbohydrates (2 servings x 15 g = 30 g).  For foods that have more than one food mixed, such as soups and casseroles, you must count the carbohydrates in each food that is included. The following list contains standard serving sizes of common carbohydrate-rich foods. Each of these servings has about 15 g of carbohydrates:   hamburger bun or  English muffin.   oz (15 mL) syrup.   oz (14 g) jelly.  1 slice of bread.  1 six-inch tortilla.  3   oz (85 g) cooked rice or pasta.  4 oz (113 g) cooked dried beans.  4 oz (113 g) starchy vegetable, such as peas, corn, or potatoes.  4 oz (113 g) hot cereal.  4 oz (113 g) mashed potatoes or  of a large baked potato.  4 oz (113 g) canned or frozen fruit.  4 oz (120 mL) fruit juice.  4-6 crackers.  6 chicken nuggets.  6 oz (170 g) unsweetened dry cereal.  6 oz (170 g) plain  fat-free yogurt or yogurt sweetened with artificial sweeteners.  8 oz (240 mL) milk.  8 oz (170 g) fresh fruit or one small piece of fruit.  24 oz (680 g) popped popcorn. Example of carbohydrate counting Sample meal   3 oz (85 g) chicken breast.  6 oz (170 g) brown rice.  4 oz (113 g) corn.  8 oz (240 mL) milk.  8 oz (170 g) strawberries with sugar-free whipped topping. Carbohydrate calculation  1. Identify the foods that contain carbohydrates:  Rice.  Corn.  Milk.  Strawberries. 2. Calculate how many servings you have of each food:  2 servings rice.  1 serving corn.  1 serving milk.  1 serving strawberries. 3. Multiply each number of servings by 15 g:  2 servings rice x 15 g = 30 g.  1 serving corn x 15 g = 15 g.  1 serving milk x 15 g = 15 g.  1 serving strawberries x 15 g = 15 g. 4. Add together all of the amounts to find the total grams of carbohydrates eaten:  30 g + 15 g + 15 g + 15 g = 75 g of carbohydrates total. This information is not intended to replace advice given to you by your health care provider. Make sure you discuss any questions you have with your health care provider. Document Released: 04/14/2005 Document Revised: 11/02/2015 Document Reviewed: 09/26/2015 Elsevier Interactive Patient Education  2017 Elsevier Inc.  

## 2016-07-23 NOTE — Progress Notes (Signed)
Subjective:    Patient ID: Yvonne Fisher, female    DOB: [birth year], 41 y.o.   MRN: [id number]  07/23/2016  Follow-up (Diabetes)   HPI This 41 y.o. female presents for evaluation of DMII. Patient reports good compliance with medication, good tolerance to medication, and good symptom control.  Did not increase Metformin to tid. Not checking sugars; does not have meter.  Gave meter to boyfriend.  Boyfriend with AMI.  Next appointment with ophthalmology in June 2018.   Not exercising; not dieting.  Stays on the move all the time; very busy; never sits down.  Frustrated by weight gain.  Does not understand how gaining weight; only eats vegetables. No time to cook at home; grabs food on the go.   s/p repeat thyroid US that revealed stable multinodular goiter with prominent nodule that is stable in size.  Recommendation to follow annually for five years.  Mirena IUD for contraception.   Immunization History  Administered Date(s) Administered  . Influenza Split 04/16/2011  . Influenza,inj,Quad PF,36+ Mos 02/01/2014, 04/04/2015, 03/18/2016  . Pneumococcal Polysaccharide-23 03/18/2016  . Tdap 06/15/2013   BP Readings from Last 3 Encounters:  07/23/16 (!) 154/90  03/18/16 130/90  02/16/16 166/91   Wt Readings from Last 3 Encounters:  07/23/16 241 lb (109.3 kg)  03/18/16 236 lb 6.4 oz (107.2 kg)  09/11/15 245 lb (111.1 kg)     Review of Systems  Constitutional: Negative for chills, diaphoresis, fatigue and fever.  Eyes: Negative for visual disturbance.  Respiratory: Negative for cough and shortness of breath.   Cardiovascular: Negative for chest pain, palpitations and leg swelling.  Gastrointestinal: Negative for abdominal pain, constipation, diarrhea, nausea and vomiting.  Endocrine: Negative for cold intolerance, heat intolerance, polydipsia, polyphagia and polyuria.  Neurological: Negative for dizziness, tremors, seizures, syncope, facial asymmetry, speech difficulty, weakness,  light-headedness, numbness and headaches.    Past Medical History:  Diagnosis Date  . Arthritis    right elbow   . Diabetes ([place])   . GERD (gastroesophageal reflux disease)   . Gestational diabetes    diet controlled  . Hidradenitis 11/2011   left axilla  . Pregnancy induced hypertension 2015   "still keeping eye on it" (01/18/2014)  . Rash 12/01/2011   bilat. axilla  . Thyroid nodule    Past Surgical History:  Procedure Laterality Date  . CHOLECYSTECTOMY  04/15/2011   Procedure: LAPAROSCOPIC CHOLECYSTECTOMY WITH INTRAOPERATIVE CHOLANGIOGRAM;  Surgeon: Adin Hector, MD;  Location: [facility];  Service: General;  Laterality: N/A;  laparoscopic cholecystectomy with cholangiogram  . ELBOW ARTHROSCOPY  1997   right elbow  . HYDRADENITIS EXCISION  12/05/2011   Procedure: EXCISION HYDRADENITIS AXILLA;  Surgeon: Harl Bowie, MD;  Location: [place];  Service: General;  Laterality: Left;  wide excision hidradenitis left axilla  . THYROID LOBECTOMY Left 01/18/2014  . THYROIDECTOMY Left 01/18/2014   Procedure: LEFT THYROID LOBECTOMY;  Surgeon: Coralie Keens, MD;  Location: [place];  Service: General;  Laterality: Left;   Allergies  Allergen Reactions  . Adhesive [Tape] Other (See Comments)    TEARS SKIN  . Prevacid [Lansoprazole] Hives    Social History   Social History  . Marital status: Single    Spouse name: N/A  . Number of children: 2  . Years of education: N/A   Occupational History  .      order processor   Social History Main Topics  . Smoking status: Never Smoker  . Smokeless tobacco: Never  Used  . Alcohol use Yes     Comment: 01/18/2014 "might have a drink @ birthday party or holidays; not all the time"  . Drug use: No  . Sexual activity: No   Other Topics Concern  . Not on file   Social History Narrative   Marital status: single; dating seriously x 10 years; happy; no abuse.      Children:  (58 yo son, 22 week old boy).      Lives: with 2  sons, boyfriend.      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 Hypertension Mother   . Multiple sclerosis Mother   . Diabetes Father   . Heart disease Father 14    AMI       Objective:    BP (!) 154/90   Pulse 86   Temp 98.2 F (36.8 C) (Oral)   Resp 18   Ht 5' 9.5" (1.765 m)   Wt 241 lb (109.3 kg)   LMP 07/02/2016   SpO2 100%   BMI 35.08 kg/m  Physical Exam  Constitutional: She is oriented to person, place, and time. She appears well-developed and well-nourished. No distress.  HENT:  Head: Normocephalic and atraumatic.  Right Ear: External ear normal.  Left Ear: External ear normal.  Nose: Nose normal.  Mouth/Throat: Oropharynx is clear and moist.  Eyes: Conjunctivae and EOM are normal. Pupils are equal, round, and reactive to light.  Neck: Normal range of motion. Neck supple. Carotid bruit is not present. Thyroid mass and thyromegaly present.    R sided thyroid enlargement; well healed horizontal scar.  Cardiovascular: Normal rate, regular rhythm, normal heart sounds and intact distal pulses.  Exam reveals no gallop and no friction rub.   No murmur heard. Pulmonary/Chest: Effort normal and breath sounds normal. She has no wheezes. She has no rales.  Abdominal: Soft. Bowel sounds are normal. She exhibits no distension and no mass. There is no tenderness. There is no rebound and no guarding.  Lymphadenopathy:    She has no cervical adenopathy.  Neurological: She is alert and oriented to person, place, and time. No cranial nerve deficit.  Skin: Skin is warm and dry. No rash noted. She is not diaphoretic. No erythema. No pallor.  Psychiatric: She has a normal mood and affect. Her behavior is normal.   Results for orders placed or performed in visit on 07/23/16  POCT glucose (manual entry)  Result Value Ref Range     POC Glucose 100 (A) 70 - 99 mg/dl  POCT glycosylated hemoglobin (Hb A1C)  Result Value Ref Range   Hemoglobin A1C 7.3   POCT urinalysis dipstick  Result Value Ref Range   Color, UA yellow yellow   Clarity, UA clear clear   Glucose, UA negative negative   Bilirubin, UA negative negative   Ketones, POC UA negative negative   Spec Grav, UA 1.015 1.030 - 1.035   Blood, UA small (A) negative   pH, UA 5.0 5.0 - 8.0   Protein Ur, POC negative negative   Urobilinogen, UA 0.2 Negative - 2.0   Nitrite, UA Negative Negative   Leukocytes, UA Negative Negative   Depression screen [facility] 2/9 07/23/2016 03/18/2016 09/11/2015 05/14/2015 04/16/2015  Decreased Interest 0 0 0 0 0  Down, Depressed, Hopeless  0 0 0 0 0  PHQ - 2 Score 0 0 0 0 0   Fall Risk  07/23/2016 03/18/2016 09/11/2015 04/16/2015 04/09/2015  Falls in the past year? No No No No No       Assessment & Plan:   1. Type 2 diabetes mellitus without complication, without long-term current use of insulin ([facility])   2. Class 2 obesity due to excess calories with serious comorbidity and body mass index (BMI) of 35.0 to 35.9 in adult   3. Thyroid nodule   4. Essential hypertension    -uncontrolled diabetes; increase Metformin to 1000mg  bid. -new onset hypertension; rx for HCTZ 12.5mg  daily; has IUD Mirena yet still of childbearing age thus will not start ARB until after age 15.  -appointment in June for eye exam. -s/p repeat thyroid US that revealed stable thyroid nodule with multinodular goiter; recommend repeat thyroid US annually for five years; pt expressed understanding. -highly encourage weight loss, exercise, and low-carbohydrate low-fat food choices.   Encourage again [email or web address]   Orders Placed This Encounter  Procedures  . CBC with Differential/Platelet  . Comprehensive metabolic panel  . POCT glucose (manual entry)  . POCT glycosylated hemoglobin (Hb A1C)  . POCT urinalysis dipstick  . HM Diabetes Foot Exam   Meds  ordered this encounter  Medications  . hydrochlorothiazide (HYDRODIURIL) 12.5 MG tablet    Sig: Take 1 tablet (12.5 mg total) by mouth daily.    Dispense:  90 tablet    Refill:  1  . metFORMIN (GLUCOPHAGE) 1000 MG tablet    Sig: One tablet twice daily with meals    Dispense:  180 tablet    Refill:  3    Return in about 3 months (around 10/23/2016) for recheck.   [place] Dani Elayne Guerin, M.D. [facility] previously [facility] [address] [place], [place]  [address] [phone] phone [phone] fax

## 2016-07-24 LAB — CBC WITH DIFFERENTIAL/PLATELET
BASOS: 0 %
Basophils Absolute: 0 10*3/uL (ref 0.0–0.2)
EOS (ABSOLUTE): 0.1 10*3/uL (ref 0.0–0.4)
Eos: 1 %
HEMATOCRIT: 39.7 % (ref 34.0–46.6)
Hemoglobin: 12.9 g/dL (ref 11.1–15.9)
Immature Grans (Abs): 0 10*3/uL (ref 0.0–0.1)
Immature Granulocytes: 0 %
LYMPHS ABS: 3.4 10*3/uL — AB (ref 0.7–3.1)
Lymphs: 51 %
MCH: 28.2 pg (ref 26.6–33.0)
MCHC: 32.5 g/dL (ref 31.5–35.7)
MCV: 87 fL (ref 79–97)
MONOS ABS: 0.5 10*3/uL (ref 0.1–0.9)
Monocytes: 7 %
NEUTROS ABS: 2.8 10*3/uL (ref 1.4–7.0)
NEUTROS PCT: 41 %
PLATELETS: 287 10*3/uL (ref 150–379)
RBC: 4.57 x10E6/uL (ref 3.77–5.28)
RDW: 13.9 % (ref 12.3–15.4)
WBC: 6.9 10*3/uL (ref 3.4–10.8)

## 2016-07-24 LAB — COMPREHENSIVE METABOLIC PANEL
A/G RATIO: 1.4 (ref 1.2–2.2)
ALT: 20 IU/L (ref 0–32)
AST: 17 IU/L (ref 0–40)
Albumin: 4 g/dL (ref 3.5–5.5)
Alkaline Phosphatase: 88 IU/L (ref 39–117)
BILIRUBIN TOTAL: 0.3 mg/dL (ref 0.0–1.2)
BUN/Creatinine Ratio: 14 (ref 9–23)
BUN: 11 mg/dL (ref 6–24)
CO2: 23 mmol/L (ref 18–29)
Calcium: 9.2 mg/dL (ref 8.7–10.2)
Chloride: 97 mmol/L (ref 96–106)
Creatinine, Ser: 0.76 mg/dL (ref 0.57–1.00)
GFR calc Af Amer: 112 mL/min/{1.73_m2} (ref >59)
GFR calc non Af Amer: 97 mL/min/{1.73_m2} (ref >59)
GLOBULIN, TOTAL: 2.9 g/dL (ref 1.5–4.5)
Glucose: 96 mg/dL (ref 65–99)
Potassium: 4.2 mmol/L (ref 3.5–5.2)
SODIUM: 138 mmol/L (ref 134–144)
Total Protein: 6.9 g/dL (ref 6.0–8.5)

## 2016-10-22 ENCOUNTER — Encounter: Payer: Self-pay | Admitting: Family Medicine

## 2016-10-22 ENCOUNTER — Ambulatory Visit (INDEPENDENT_AMBULATORY_CARE_PROVIDER_SITE_OTHER): Payer: 59 | Admitting: Family Medicine

## 2016-10-22 VITALS — BP 138/82 | HR 93 | Temp 98.0°F | Resp 18 | Ht 68.11 in | Wt 236.0 lb

## 2016-10-22 DIAGNOSIS — E66812 Obesity, class 2: Secondary | ICD-10-CM

## 2016-10-22 DIAGNOSIS — I10 Essential (primary) hypertension: Secondary | ICD-10-CM | POA: Diagnosis not present

## 2016-10-22 DIAGNOSIS — E119 Type 2 diabetes mellitus without complications: Secondary | ICD-10-CM

## 2016-10-22 DIAGNOSIS — Z6835 Body mass index (BMI) 35.0-35.9, adult: Secondary | ICD-10-CM | POA: Diagnosis not present

## 2016-10-22 LAB — POCT URINALYSIS DIP (MANUAL ENTRY)
BILIRUBIN UA: NEGATIVE mg/dL
Bilirubin, UA: NEGATIVE
Glucose, UA: NEGATIVE mg/dL
Nitrite, UA: NEGATIVE
PH UA: 5.5 (ref 5.0–8.0)
PROTEIN UA: NEGATIVE mg/dL
Spec Grav, UA: 1.01 (ref 1.010–1.025)
UROBILINOGEN UA: 0.2 U/dL

## 2016-10-22 LAB — POCT GLYCOSYLATED HEMOGLOBIN (HGB A1C): Hemoglobin A1C: 7

## 2016-10-22 LAB — GLUCOSE, POCT (MANUAL RESULT ENTRY): POC GLUCOSE: 100 mg/dL — AB (ref 70–99)

## 2016-10-22 NOTE — Patient Instructions (Addendum)
   IF you received an x-ray today, you will receive an invoice from [facility]. Please contact [facility] at [phone] with questions or concerns regarding your invoice.   IF you received labwork today, you will receive an invoice from [facility]. Please contact [facility] at [phone] with questions or concerns regarding your invoice.   Our billing staff will not be able to assist you with questions regarding bills from these companies.  You will be contacted with the lab results as soon as they are available. The fastest way to get your results is to activate your My Chart account. Instructions are located on the last page of this paperwork. If you have not heard from us regarding the results in 2 weeks, please contact this office.     Diabetes Mellitus and Exercise Exercising regularly is important for your overall health, especially when you have diabetes (diabetes mellitus). Exercising is not only about losing weight. It has many health benefits, such as increasing muscle strength and bone density and reducing body fat and stress. This leads to improved fitness, flexibility, and endurance, all of which result in better overall health. Exercise has additional benefits for people with diabetes, including:  Reducing appetite.  Helping to lower and control blood glucose.  Lowering blood pressure.  Helping to control amounts of fatty substances (lipids) in the blood, such as cholesterol and triglycerides.  Helping the body to respond better to insulin (improving insulin sensitivity).  Reducing how much insulin the body needs.  Decreasing the risk for heart disease by: ? Lowering cholesterol and triglyceride levels. ? Increasing the levels of good cholesterol. ? Lowering blood glucose levels.  What is my activity plan? Your health care provider or certified diabetes educator can help you make a plan for the type and frequency of exercise (activity plan) that  works for you. Make sure that you:  Do at least 150 minutes of moderate-intensity or vigorous-intensity exercise each week. This could be brisk walking, biking, or water aerobics. ? Do stretching and strength exercises, such as yoga or weightlifting, at least 2 times a week. ? Spread out your activity over at least 3 days of the week.  Get some form of physical activity every day. ? Do not go more than 2 days in a row without some kind of physical activity. ? Avoid being inactive for more than 90 minutes at a time. Take frequent breaks to walk or stretch.  Choose a type of exercise or activity that you enjoy, and set realistic goals.  Start slowly, and gradually increase the intensity of your exercise over time.  What do I need to know about managing my diabetes?  Check your blood glucose before and after exercising. ? If your blood glucose is higher than 240 mg/dL (13.3 mmol/L) before you exercise, check your urine for ketones. If you have ketones in your urine, do not exercise until your blood glucose returns to normal.  Know the symptoms of low blood glucose (hypoglycemia) and how to treat it. Your risk for hypoglycemia increases during and after exercise. Common symptoms of hypoglycemia can include: ? Hunger. ? Anxiety. ? Sweating and feeling clammy. ? Confusion. ? Dizziness or feeling light-headed. ? Increased heart rate or palpitations. ? Blurry vision. ? Tingling or numbness around the mouth, lips, or tongue. ? Tremors or shakes. ? Irritability.  Keep a rapid-acting carbohydrate snack available before, during, and after exercise to help prevent or treat hypoglycemia.  Avoid injecting insulin into areas of the body   that are going to be exercised. For example, avoid injecting insulin into: ? The arms, when playing tennis. ? The legs, when jogging.  Keep records of your exercise habits. Doing this can help you and your health care provider adjust your diabetes management plan  as needed. Write down: ? Food that you eat before and after you exercise. ? Blood glucose levels before and after you exercise. ? The type and amount of exercise you have done. ? When your insulin is expected to peak, if you use insulin. Avoid exercising at times when your insulin is peaking.  When you start a new exercise or activity, work with your health care provider to make sure the activity is safe for you, and to adjust your insulin, medicines, or food intake as needed.  Drink plenty of water while you exercise to prevent dehydration or heat stroke. Drink enough fluid to keep your urine clear or pale yellow. This information is not intended to replace advice given to you by your health care provider. Make sure you discuss any questions you have with your health care provider. Document Released: 07/05/2003 Document Revised: 11/02/2015 Document Reviewed: 09/24/2015 Elsevier Interactive Patient Education  2018 Elsevier Inc.  

## 2016-10-22 NOTE — Progress Notes (Signed)
Subjective:    Patient ID: Yvonne Fisher, female    DOB: 06-08-1975, 41 y.o.   MRN: [id number]  10/22/2016  Diabetes (3 month follow-up) and Hypertension   HPI This 41 y.o. female presents for evaluation of DMII, hypertension. Patient reports good compliance with medication, good tolerance to medication, and good symptom control.  Lost glucometer.  Not checking BP or sugar.  Mirena.    BP Readings from Last 3 Encounters:  10/22/16 138/82  07/23/16 (!) 154/90  03/18/16 130/90   Wt Readings from Last 3 Encounters:  10/22/16 236 lb (107 kg)  07/23/16 241 lb (109.3 kg)  03/18/16 236 lb 6.4 oz (107.2 kg)   Immunization History  Administered Date(s) Administered  . Influenza Split 04/16/2011  . Influenza,inj,Quad PF,36+ Mos 02/01/2014, 04/04/2015, 03/18/2016  . Pneumococcal Polysaccharide-23 03/18/2016  . Tdap 06/15/2013     Review of Systems  Constitutional: Negative for chills, diaphoresis, fatigue and fever.  Eyes: Negative for visual disturbance.  Respiratory: Negative for cough and shortness of breath.   Cardiovascular: Negative for chest pain, palpitations and leg swelling.  Gastrointestinal: Negative for abdominal pain, constipation, diarrhea, nausea and vomiting.  Endocrine: Negative for cold intolerance, heat intolerance, polydipsia, polyphagia and polyuria.  Neurological: Negative for dizziness, tremors, seizures, syncope, facial asymmetry, speech difficulty, weakness, light-headedness, numbness and headaches.    Past Medical History:  Diagnosis Date  . Arthritis    right elbow   . Diabetes ([place])   . GERD (gastroesophageal reflux disease)   . Gestational diabetes    diet controlled  . Hidradenitis 11/2011   left axilla  . Pregnancy induced hypertension 2015   "still keeping eye on it" (01/18/2014)  . Rash 12/01/2011   bilat. axilla  . Thyroid nodule    Past Surgical History:  Procedure Laterality Date  . CHOLECYSTECTOMY  04/15/2011   Procedure:  LAPAROSCOPIC CHOLECYSTECTOMY WITH INTRAOPERATIVE CHOLANGIOGRAM;  Surgeon: Adin Hector, MD;  Location: [facility];  Service: General;  Laterality: N/A;  laparoscopic cholecystectomy with cholangiogram  . ELBOW ARTHROSCOPY  1997   right elbow  . HYDRADENITIS EXCISION  12/05/2011   Procedure: EXCISION HYDRADENITIS AXILLA;  Surgeon: Harl Bowie, MD;  Location: [facility];  Service: General;  Laterality: Left;  wide excision hidradenitis left axilla  . THYROID LOBECTOMY Left 01/18/2014  . THYROIDECTOMY Left 01/18/2014   Procedure: LEFT THYROID LOBECTOMY;  Surgeon: Coralie Keens, MD;  Location: [facility];  Service: General;  Laterality: Left;   Allergies  Allergen Reactions  . Adhesive [Tape] Other (See Comments)    TEARS SKIN  . Prevacid [Lansoprazole] Hives   Current Outpatient Prescriptions  Medication Sig Dispense Refill  . hydrochlorothiazide (HYDRODIURIL) 12.5 MG tablet Take 1 tablet (12.5 mg total) by mouth daily. 90 tablet 1  . metFORMIN (GLUCOPHAGE) 1000 MG tablet One tablet twice daily with meals 180 tablet 3   No current facility-administered medications for this visit.    Social History   Social History  . Marital status: Single    Spouse name: N/A  . Number of children: 2  . Years of education: N/A   Occupational History  .      order processor   Social History Main Topics  . Smoking status: Never Smoker  . Smokeless tobacco: Never Used  . Alcohol use Yes     Comment: 01/18/2014 "might have a drink @ birthday party or holidays; not all the time"  . Drug use: No  . Sexual activity: No  Other Topics Concern  . Not on file   Social History Narrative   Marital status: single; dating seriously x 10 years; happy; no abuse.      Children:  (85 yo son, 47 week old boy).      Lives: with 2 sons, boyfriend.      Employment:  [employer] x 15 years; work processor; pushing/pulling/walking      Tobacco: none      Alcohol: none      Drug:  none   Family History  Problem Relation Age of Onset  . Hypertension Mother   . Multiple sclerosis Mother   . Diabetes Father   . Heart disease Father 67       AMI       Objective:    BP 138/82   Pulse 93   Temp 98 F (36.7 C) (Oral)   Resp 18   Ht 5' 8.11" (1.73 m)   Wt 236 lb (107 kg)   LMP 10/15/2016 (Approximate)   SpO2 100%   BMI 35.77 kg/m  Physical Exam  Constitutional: She is oriented to person, place, and time. She appears well-developed and well-nourished. No distress.  HENT:  Head: Normocephalic and atraumatic.  Right Ear: External ear normal.  Left Ear: External ear normal.  Nose: Nose normal.  Mouth/Throat: Oropharynx is clear and moist.  Eyes: Conjunctivae and EOM are normal. Pupils are equal, round, and reactive to light.  Neck: Normal range of motion. Neck supple. Carotid bruit is not present. No thyromegaly present.  Cardiovascular: Normal rate, regular rhythm, normal heart sounds and intact distal pulses.  Exam reveals no gallop and no friction rub.   No murmur heard. Pulmonary/Chest: Effort normal and breath sounds normal. She has no wheezes. She has no rales.  Abdominal: Soft. Bowel sounds are normal. She exhibits no distension and no mass. There is no tenderness. There is no rebound and no guarding.  Lymphadenopathy:    She has no cervical adenopathy.  Neurological: She is alert and oriented to person, place, and time. No cranial nerve deficit.  Skin: Skin is warm and dry. No rash noted. She is not diaphoretic. No erythema. No pallor.  Psychiatric: She has a normal mood and affect. Her behavior is normal.   Results for orders placed or performed in visit on 10/22/16  CBC with Differential/Platelet  Result Value Ref Range   WBC 7.7 3.4 - 10.8 x10E3/uL   RBC 4.58 3.77 - 5.28 x10E6/uL   Hemoglobin 13.3 11.1 - 15.9 g/dL   Hematocrit 39.8 34.0 - 46.6 %   MCV 87 79 - 97 fL   MCH 29.0 26.6 - 33.0 pg   MCHC 33.4 31.5 - 35.7 g/dL   RDW 13.8 12.3 -  15.4 %   Platelets 307 150 - 379 x10E3/uL   Neutrophils 41 Not Estab. %   Lymphs 51 Not Estab. %   Monocytes 6 Not Estab. %   Eos 2 Not Estab. %   Basos 0 Not Estab. %   Neutrophils Absolute 3.2 1.4 - 7.0 x10E3/uL   Lymphocytes Absolute 3.9 (H) 0.7 - 3.1 x10E3/uL   Monocytes Absolute 0.5 0.1 - 0.9 x10E3/uL   EOS (ABSOLUTE) 0.1 0.0 - 0.4 x10E3/uL   Basophils Absolute 0.0 0.0 - 0.2 x10E3/uL   Immature Granulocytes 0 Not Estab. %   Immature Grans (Abs) 0.0 0.0 - 0.1 x10E3/uL  Comprehensive metabolic panel  Result Value Ref Range   Glucose 101 (H) 65 - 99 mg/dL  BUN 13 6 - 24 mg/dL   Creatinine, Ser 0.82 0.57 - 1.00 mg/dL   GFR calc non Af Amer 88 >59 mL/min/1.73   GFR calc Af Amer 101 >59 mL/min/1.73   BUN/Creatinine Ratio 16 9 - 23   Sodium 140 134 - 144 mmol/L   Potassium 3.9 3.5 - 5.2 mmol/L   Chloride 100 96 - 106 mmol/L   CO2 23 20 - 29 mmol/L   Calcium 9.4 8.7 - 10.2 mg/dL   Total Protein 7.3 6.0 - 8.5 g/dL   Albumin 4.3 3.5 - 5.5 g/dL   Globulin, Total 3.0 1.5 - 4.5 g/dL   Albumin/Globulin Ratio 1.4 1.2 - 2.2   Bilirubin Total 0.3 0.0 - 1.2 mg/dL   Alkaline Phosphatase 94 39 - 117 IU/L   AST 16 0 - 40 IU/L   ALT 21 0 - 32 IU/L  TSH  Result Value Ref Range   TSH 0.816 0.450 - 4.500 uIU/mL  Lipid panel  Result Value Ref Range   Cholesterol, Total 205 (H) 100 - 199 mg/dL   Triglycerides 89 0 - 149 mg/dL   HDL 63 >39 mg/dL   VLDL Cholesterol Cal 18 5 - 40 mg/dL   LDL Calculated 124 (H) 0 - 99 mg/dL   Chol/HDL Ratio 3.3 0.0 - 4.4 ratio  POCT glycosylated hemoglobin (Hb A1C)  Result Value Ref Range   Hemoglobin A1C 7.0   POCT glucose (manual entry)  Result Value Ref Range   POC Glucose 100 (A) 70 - 99 mg/dl  POCT urinalysis dipstick  Result Value Ref Range   Color, UA yellow yellow   Clarity, UA cloudy (A) clear   Glucose, UA negative negative mg/dL   Bilirubin, UA negative negative   Ketones, POC UA negative negative mg/dL   Spec Grav, UA 1.010 1.010 - 1.025    Blood, UA small (A) negative   pH, UA 5.5 5.0 - 8.0   Protein Ur, POC negative negative mg/dL   Urobilinogen, UA 0.2 0.2 or 1.0 E.U./dL   Nitrite, UA Negative Negative   Leukocytes, UA Trace (A) Negative       Assessment & Plan:   1. Type 2 diabetes mellitus without complication, without long-term current use of insulin ([place])   2. Essential hypertension   3. Class 2 severe obesity due to excess calories with serious comorbidity and body mass index (BMI) of 35.0 to 35.9 in adult [facility])    -improved control; obtain labs; continue current medications. -congratulations on ongoing weight loss.   Orders Placed This Encounter  Procedures  . CBC with Differential/Platelet  . Comprehensive metabolic panel    Order Specific Question:   Has the patient fasted?    Answer:   Yes  . TSH  . Lipid panel    Order Specific Question:   Has the patient fasted?    Answer:   Yes  . POCT glycosylated hemoglobin (Hb A1C)  . POCT glucose (manual entry)  . POCT urinalysis dipstick   No orders of the defined types were placed in this encounter.   Return in about 3 months (around 01/22/2017) for recheck diabetes, high blood pressure.   Syan Cullimore Elayne Guerin, M.D. Primary Care at [facility] [address] [place], [place]  [address] [phone] phone [phone] fax

## 2016-10-23 LAB — CBC WITH DIFFERENTIAL/PLATELET
BASOS ABS: 0 10*3/uL (ref 0.0–0.2)
Basos: 0 %
EOS (ABSOLUTE): 0.1 10*3/uL (ref 0.0–0.4)
Eos: 2 %
Hematocrit: 39.8 % (ref 34.0–46.6)
Hemoglobin: 13.3 g/dL (ref 11.1–15.9)
Immature Grans (Abs): 0 10*3/uL (ref 0.0–0.1)
Immature Granulocytes: 0 %
LYMPHS ABS: 3.9 10*3/uL — AB (ref 0.7–3.1)
Lymphs: 51 %
MCH: 29 pg (ref 26.6–33.0)
MCHC: 33.4 g/dL (ref 31.5–35.7)
MCV: 87 fL (ref 79–97)
MONOS ABS: 0.5 10*3/uL (ref 0.1–0.9)
Monocytes: 6 %
Neutrophils Absolute: 3.2 10*3/uL (ref 1.4–7.0)
Neutrophils: 41 %
PLATELETS: 307 10*3/uL (ref 150–379)
RBC: 4.58 x10E6/uL (ref 3.77–5.28)
RDW: 13.8 % (ref 12.3–15.4)
WBC: 7.7 10*3/uL (ref 3.4–10.8)

## 2016-10-23 LAB — COMPREHENSIVE METABOLIC PANEL
ALBUMIN: 4.3 g/dL (ref 3.5–5.5)
ALK PHOS: 94 IU/L (ref 39–117)
ALT: 21 IU/L (ref 0–32)
AST: 16 IU/L (ref 0–40)
Albumin/Globulin Ratio: 1.4 (ref 1.2–2.2)
BILIRUBIN TOTAL: 0.3 mg/dL (ref 0.0–1.2)
BUN / CREAT RATIO: 16 (ref 9–23)
BUN: 13 mg/dL (ref 6–24)
CHLORIDE: 100 mmol/L (ref 96–106)
CO2: 23 mmol/L (ref 20–29)
Calcium: 9.4 mg/dL (ref 8.7–10.2)
Creatinine, Ser: 0.82 mg/dL (ref 0.57–1.00)
GFR calc Af Amer: 101 mL/min/{1.73_m2} (ref >59)
GFR calc non Af Amer: 88 mL/min/{1.73_m2} (ref >59)
GLUCOSE: 101 mg/dL — AB (ref 65–99)
Globulin, Total: 3 g/dL (ref 1.5–4.5)
Potassium: 3.9 mmol/L (ref 3.5–5.2)
Sodium: 140 mmol/L (ref 134–144)
Total Protein: 7.3 g/dL (ref 6.0–8.5)

## 2016-10-23 LAB — LIPID PANEL
CHOLESTEROL TOTAL: 205 mg/dL — AB (ref 100–199)
Chol/HDL Ratio: 3.3 ratio (ref 0.0–4.4)
HDL: 63 mg/dL (ref >39)
LDL Calculated: 124 mg/dL — ABNORMAL HIGH (ref 0–99)
Triglycerides: 89 mg/dL (ref 0–149)
VLDL Cholesterol Cal: 18 mg/dL (ref 5–40)

## 2016-10-23 LAB — TSH: TSH: 0.816 u[IU]/mL (ref 0.450–4.500)

## 2016-10-26 ENCOUNTER — Encounter: Payer: Self-pay | Admitting: Family Medicine

## 2016-10-27 NOTE — Progress Notes (Signed)
Letter sent.

## 2016-11-26 DIAGNOSIS — N6320 Unspecified lump in the left breast, unspecified quadrant: Secondary | ICD-10-CM | POA: Diagnosis not present

## 2016-11-26 DIAGNOSIS — N6489 Other specified disorders of breast: Secondary | ICD-10-CM | POA: Diagnosis not present

## 2016-11-26 DIAGNOSIS — Z09 Encounter for follow-up examination after completed treatment for conditions other than malignant neoplasm: Secondary | ICD-10-CM | POA: Diagnosis not present

## 2017-01-20 ENCOUNTER — Encounter: Payer: Self-pay | Admitting: Family Medicine

## 2017-01-20 ENCOUNTER — Ambulatory Visit (INDEPENDENT_AMBULATORY_CARE_PROVIDER_SITE_OTHER): Payer: 59 | Admitting: Family Medicine

## 2017-01-20 VITALS — BP 130/82 | HR 100 | Temp 98.0°F | Resp 16 | Ht 68.9 in | Wt 235.0 lb

## 2017-01-20 DIAGNOSIS — I10 Essential (primary) hypertension: Secondary | ICD-10-CM | POA: Diagnosis not present

## 2017-01-20 DIAGNOSIS — Z23 Encounter for immunization: Secondary | ICD-10-CM

## 2017-01-20 DIAGNOSIS — E119 Type 2 diabetes mellitus without complications: Secondary | ICD-10-CM | POA: Diagnosis not present

## 2017-01-20 DIAGNOSIS — Z6835 Body mass index (BMI) 35.0-35.9, adult: Secondary | ICD-10-CM | POA: Diagnosis not present

## 2017-01-20 DIAGNOSIS — E041 Nontoxic single thyroid nodule: Secondary | ICD-10-CM

## 2017-01-20 NOTE — Patient Instructions (Addendum)
IF you received an x-ray today, you will receive an invoice from [facility]. Please contact [facility] at [phone] with questions or concerns regarding your invoice.   IF you received labwork today, you will receive an invoice from [facility]. Please contact [facility] at [phone] with questions or concerns regarding your invoice.   Our billing staff will not be able to assist you with questions regarding bills from these companies.  You will be contacted with the lab results as soon as they are available. The fastest way to get your results is to activate your My Chart account. Instructions are located on the last page of this paperwork. If you have not heard from [place] regarding the results in 2 weeks, please contact this office.      Carbohydrate Counting for Diabetes Mellitus, Adult Carbohydrate counting is a method for keeping track of how many carbohydrates you eat. Eating carbohydrates naturally increases the amount of sugar (glucose) in the blood. Counting how many carbohydrates you eat helps keep your blood glucose within normal limits, which helps you manage your diabetes (diabetes mellitus). It is important to know how many carbohydrates you can safely have in each meal. This is different for every person. A diet and nutrition specialist (registered dietitian) can help you make a meal plan and calculate how many carbohydrates you should have at each meal and snack. Carbohydrates are found in the following foods:  Grains, such as breads and cereals.  Dried beans and soy products.  Starchy vegetables, such as potatoes, peas, and corn.  Fruit and fruit juices.  Milk and yogurt.  Sweets and snack foods, such as cake, cookies, candy, chips, and soft drinks.  How do I count carbohydrates? There are two ways to count carbohydrates in food. You can use either of the methods or a combination of both. Reading "Nutrition Facts" on packaged food The  "Nutrition Facts" list is included on the labels of almost all packaged foods and beverages in the [place] It includes:  The serving size.  Information about nutrients in each serving, including the grams (g) of carbohydrate per serving.  To use the "Nutrition Facts":  Decide how many servings you will have.  Multiply the number of servings by the number of carbohydrates per serving.  The resulting number is the total amount of carbohydrates that you will be having.  Learning standard serving sizes of other foods When you eat foods containing carbohydrates that are not packaged or do not include "Nutrition Facts" on the label, you need to measure the servings in order to count the amount of carbohydrates:  Measure the foods that you will eat with a food scale or measuring cup, if needed.  Decide how many standard-size servings you will eat.  Multiply the number of servings by 15. Most carbohydrate-rich foods have about 15 g of carbohydrates per serving. ? For example, if you eat 8 oz (170 g) of strawberries, you will have eaten 2 servings and 30 g of carbohydrates (2 servings x 15 g = 30 g).  For foods that have more than one food mixed, such as soups and casseroles, you must count the carbohydrates in each food that is included.  The following list contains standard serving sizes of common carbohydrate-rich foods. Each of these servings has about 15 g of carbohydrates:   hamburger bun or  English muffin.   oz (15 mL) syrup.   oz (14 g) jelly.  1 slice of bread.  1 six-inch tortilla.  3 oz (85 g) cooked rice or pasta.  4 oz (113 g) cooked dried beans.  4 oz (113 g) starchy vegetable, such as peas, corn, or potatoes.  4 oz (113 g) hot cereal.  4 oz (113 g) mashed potatoes or  of a large baked potato.  4 oz (113 g) canned or frozen fruit.  4 oz (120 mL) fruit juice.  4-6 crackers.  6 chicken nuggets.  6 oz (170 g) unsweetened dry cereal.  6 oz (170 g) plain  fat-free yogurt or yogurt sweetened with artificial sweeteners.  8 oz (240 mL) milk.  8 oz (170 g) fresh fruit or one small piece of fruit.  24 oz (680 g) popped popcorn.  Example of carbohydrate counting Sample meal  3 oz (85 g) chicken breast.  6 oz (170 g) brown rice.  4 oz (113 g) corn.  8 oz (240 mL) milk.  8 oz (170 g) strawberries with sugar-free whipped topping. Carbohydrate calculation 1. Identify the foods that contain carbohydrates: ? Rice. ? Corn. ? Milk. ? Strawberries. 2. Calculate how many servings you have of each food: ? 2 servings rice. ? 1 serving corn. ? 1 serving milk. ? 1 serving strawberries. 3. Multiply each number of servings by 15 g: ? 2 servings rice x 15 g = 30 g. ? 1 serving corn x 15 g = 15 g. ? 1 serving milk x 15 g = 15 g. ? 1 serving strawberries x 15 g = 15 g. 4. Add together all of the amounts to find the total grams of carbohydrates eaten: ? 30 g + 15 g + 15 g + 15 g = 75 g of carbohydrates total. This information is not intended to replace advice given to you by your health care provider. Make sure you discuss any questions you have with your health care provider. Document Released: 04/14/2005 Document Revised: 11/02/2015 Document Reviewed: 09/26/2015 Elsevier Interactive Patient Education  [employer].

## 2017-01-20 NOTE — Progress Notes (Signed)
Subjective:    Patient ID: Yvonne Fisher, female    DOB: 04/24/1976, 41 y.o.   MRN: [id number]  01/20/2017  Diabetes (3 month follow-up) and Hypertension   HPI This 41 y.o. female presents for evaluation of DMII, hypertension, hypercholesterolemia.  Fasting sugars 125 sugar.  Not checking BP. Quit taking HCTZ two weeks ago. Metformin barely. Trying to watch what is eating.   BP Readings from Last 3 Encounters:  01/20/17 130/82  10/22/16 138/82  07/23/16 (!) 154/90   Wt Readings from Last 3 Encounters:  01/20/17 235 lb (106.6 kg)  10/22/16 236 lb (107 kg)  07/23/16 241 lb (109.3 kg)   Immunization History  Administered Date(s) Administered  . Influenza Split 04/16/2011  . Influenza,inj,Quad PF,6+ Mos 02/01/2014, 04/04/2015, 03/18/2016  . Pneumococcal Polysaccharide-23 03/18/2016  . Tdap 06/15/2013    Review of Systems  Constitutional: Negative for chills, diaphoresis, fatigue and fever.  Eyes: Negative for visual disturbance.  Respiratory: Negative for cough and shortness of breath.   Cardiovascular: Negative for chest pain, palpitations and leg swelling.  Gastrointestinal: Negative for abdominal pain, constipation, diarrhea, nausea and vomiting.  Endocrine: Negative for cold intolerance, heat intolerance, polydipsia, polyphagia and polyuria.  Neurological: Negative for dizziness, tremors, seizures, syncope, facial asymmetry, speech difficulty, weakness, light-headedness, numbness and headaches.    Past Medical History:  Diagnosis Date  . Arthritis    right elbow   . Diabetes ([place])   . GERD (gastroesophageal reflux disease)   . Gestational diabetes    diet controlled  . Hidradenitis 11/2011   left axilla  . Pregnancy induced hypertension 2015   "still keeping eye on it" (01/18/2014)  . Rash 12/01/2011   bilat. axilla  . Thyroid nodule    Past Surgical History:  Procedure Laterality Date  . CHOLECYSTECTOMY  04/15/2011   Procedure: LAPAROSCOPIC  CHOLECYSTECTOMY WITH INTRAOPERATIVE CHOLANGIOGRAM;  Surgeon: Adin Hector, MD;  Location: [facility];  Service: General;  Laterality: N/A;  laparoscopic cholecystectomy with cholangiogram  . ELBOW ARTHROSCOPY  1997   right elbow  . HYDRADENITIS EXCISION  12/05/2011   Procedure: EXCISION HYDRADENITIS AXILLA;  Surgeon: Harl Bowie, MD;  Location: [facility];  Service: General;  Laterality: Left;  wide excision hidradenitis left axilla  . THYROID LOBECTOMY Left 01/18/2014  . THYROIDECTOMY Left 01/18/2014   Procedure: LEFT THYROID LOBECTOMY;  Surgeon: Coralie Keens, MD;  Location: [facility];  Service: General;  Laterality: Left;   Allergies  Allergen Reactions  . Adhesive [Tape] Other (See Comments)    TEARS SKIN  . Prevacid [Lansoprazole] Hives   Current Outpatient Prescriptions  Medication Sig Dispense Refill  . hydrochlorothiazide (HYDRODIURIL) 12.5 MG tablet Take 1 tablet (12.5 mg total) by mouth daily. 90 tablet 1  . metFORMIN (GLUCOPHAGE) 1000 MG tablet One tablet twice daily with meals 180 tablet 3   No current facility-administered medications for this visit.    Social History   Social History  . Marital status: Single    Spouse name: N/A  . Number of children: 2  . Years of education: N/A   Occupational History  .      order processor   Social History Main Topics  . Smoking status: Never Smoker  . Smokeless tobacco: Never Used  . Alcohol use Yes     Comment: 01/18/2014 "might have a drink @ birthday party or holidays; not all the time"  . Drug use: No  . Sexual activity: No   Other Topics Concern  .  Not on file   Social History Narrative   Marital status: single; dating seriously x 10 years; happy; no abuse.      Children:  (75 yo son, 72 week old boy).      Lives: with 2 sons, boyfriend.      Employment:  [employer] x 15 years; work processor; pushing/pulling/walking      Tobacco: none      Alcohol: none      Drug: none   Family  History  Problem Relation Age of Onset  . Hypertension Mother   . Multiple sclerosis Mother   . Diabetes Father   . Heart disease Father 29       AMI       Objective:    BP 130/82   Pulse 100   Temp 98 F (36.7 C) (Oral)   Resp 16   Ht 5' 8.9" (1.75 m)   Wt 235 lb (106.6 kg)   LMP 12/22/2016 (Approximate)   SpO2 98%   BMI 34.81 kg/m  Physical Exam  Constitutional: She is oriented to person, place, and time. She appears well-developed and well-nourished. No distress.  HENT:  Head: Normocephalic and atraumatic.  Right Ear: External ear normal.  Left Ear: External ear normal.  Nose: Nose normal.  Mouth/Throat: Oropharynx is clear and moist.  Eyes: Pupils are equal, round, and reactive to light. Conjunctivae and EOM are normal.  Neck: Normal range of motion. Neck supple. Carotid bruit is not present. No thyromegaly present.  Cardiovascular: Normal rate, regular rhythm, normal heart sounds and intact distal pulses.  Exam reveals no gallop and no friction rub.   No murmur heard. Pulmonary/Chest: Effort normal and breath sounds normal. She has no wheezes. She has no rales.  Abdominal: Soft. Bowel sounds are normal. She exhibits no distension and no mass. There is no tenderness. There is no rebound and no guarding.  Lymphadenopathy:    She has no cervical adenopathy.  Neurological: She is alert and oriented to person, place, and time. No cranial nerve deficit.  Skin: Skin is warm and dry. No rash noted. She is not diaphoretic. No erythema. No pallor.  Psychiatric: She has a normal mood and affect. Her behavior is normal.    No results found. Depression screen [facility] 2/9 01/20/2017 10/22/2016 07/23/2016 03/18/2016 09/11/2015  Decreased Interest 0 0 0 0 0  Down, Depressed, Hopeless 0 0 0 0 0  PHQ - 2 Score 0 0 0 0 0   Fall Risk  01/20/2017 10/22/2016 07/23/2016 03/18/2016 09/11/2015  Falls in the past year? No No No No No        Assessment & Plan:   1. Type 2 diabetes mellitus  without complication, without long-term current use of insulin ([place])   2. Essential hypertension   3. Class 2 severe obesity due to excess calories with serious comorbidity and body mass index (BMI) of 35.0 to 35.9 in adult ([place])   4. Thyroid nodule   5. Need for prophylactic vaccination and inoculation against influenza    -moderately controlled with PRN Metformin. - I recommend weight loss, exercise, and low-carbohydrate low-sugar food choices. You should AVOID: regular sodas, sweetened tea, fruit juices.  You should LIMIT: breads, pastas, rice, potatoes, and desserts/sweets.  I would recommend limiting your total carbohydrate intake per meal to 45 grams; I would limit your total carbohydrate intake per snack to 30 grams.  I would also have a goal of 60 grams of protein intake per day; this  would equal 10-15 grams of protein per meal and 5-10 grams of protein per snack. -noncompliant with HCTZ yet BP stable right now. -recommend weight loss, exercise for 30-60 minutes five days per week; recommend 1200 kcal restriction per day with a minimum of 60 grams of protein per day. -will be due for repeat thyroid US in 03/2017; order placed.   Orders Placed This Encounter  Procedures  . US THYROID    Standing Status:   Future    Standing Expiration Date:   03/22/2018    Scheduling Instructions:     PLEASE SCHEDULE IN EARLY DECEMBER    Order Specific Question:   Reason for Exam (SYMPTOM  OR DIAGNOSIS REQUIRED)    Answer:   R thyroid nodule one year follow-up    Order Specific Question:   Preferred imaging location?    Answer:   [facility]  . Flu Vaccine QUAD 36+ mos IM  . CBC with Differential/Platelet  . Comprehensive metabolic panel  . Hemoglobin A1c   No orders of the defined types were placed in this encounter.   No Follow-up on file.   Yvonne Fisher, M.D. Primary Care at [facility] [address] [place], [place]   [id number] [phone] phone [phone] fax

## 2017-01-21 LAB — COMPREHENSIVE METABOLIC PANEL
ALBUMIN: 4.1 g/dL (ref 3.5–5.5)
ALT: 18 IU/L (ref 0–32)
AST: 20 IU/L (ref 0–40)
Albumin/Globulin Ratio: 1.4 (ref 1.2–2.2)
Alkaline Phosphatase: 91 IU/L (ref 39–117)
BUN / CREAT RATIO: 12 (ref 9–23)
BUN: 9 mg/dL (ref 6–24)
Bilirubin Total: 0.3 mg/dL (ref 0.0–1.2)
CALCIUM: 9.4 mg/dL (ref 8.7–10.2)
CO2: 24 mmol/L (ref 20–29)
CREATININE: 0.78 mg/dL (ref 0.57–1.00)
Chloride: 97 mmol/L (ref 96–106)
GFR, EST AFRICAN AMERICAN: 108 mL/min/{1.73_m2} (ref >59)
GFR, EST NON AFRICAN AMERICAN: 93 mL/min/{1.73_m2} (ref >59)
GLOBULIN, TOTAL: 2.9 g/dL (ref 1.5–4.5)
Glucose: 127 mg/dL — ABNORMAL HIGH (ref 65–99)
Potassium: 4.4 mmol/L (ref 3.5–5.2)
SODIUM: 137 mmol/L (ref 134–144)
Total Protein: 7 g/dL (ref 6.0–8.5)

## 2017-01-21 LAB — HEMOGLOBIN A1C
Est. average glucose Bld gHb Est-mCnc: 171 mg/dL
Hgb A1c MFr Bld: 7.6 % — ABNORMAL HIGH (ref 4.8–5.6)

## 2017-01-21 LAB — CBC WITH DIFFERENTIAL/PLATELET
BASOS: 0 %
Basophils Absolute: 0 10*3/uL (ref 0.0–0.2)
EOS (ABSOLUTE): 0.1 10*3/uL (ref 0.0–0.4)
EOS: 1 %
HEMATOCRIT: 40 % (ref 34.0–46.6)
HEMOGLOBIN: 13.3 g/dL (ref 11.1–15.9)
IMMATURE GRANULOCYTES: 0 %
Immature Grans (Abs): 0 10*3/uL (ref 0.0–0.1)
LYMPHS ABS: 2.6 10*3/uL (ref 0.7–3.1)
Lymphs: 44 %
MCH: 28.8 pg (ref 26.6–33.0)
MCHC: 33.3 g/dL (ref 31.5–35.7)
MCV: 87 fL (ref 79–97)
MONOCYTES: 8 %
Monocytes Absolute: 0.4 10*3/uL (ref 0.1–0.9)
NEUTROS PCT: 47 %
Neutrophils Absolute: 2.7 10*3/uL (ref 1.4–7.0)
Platelets: 303 10*3/uL (ref 150–379)
RBC: 4.62 x10E6/uL (ref 3.77–5.28)
RDW: 13.7 % (ref 12.3–15.4)
WBC: 5.7 10*3/uL (ref 3.4–10.8)

## 2017-02-13 ENCOUNTER — Telehealth: Payer: Self-pay | Admitting: Physician Assistant

## 2017-02-13 NOTE — Telephone Encounter (Signed)
Gave patient her results as Yvonne Fisher stated pt has been loosing weight and eating right she understands results but still want a call back from Dr Tamala Julian for the reason why the readings are going up we reactivated My Chart

## 2017-02-13 NOTE — Telephone Encounter (Signed)
Labs 9/25 reviewed. A1C is 7.6 (up from 7.0 last time). Kidney, liver and blood count are normal.  I suspect that Dr. Tamala Julian may recommend an additional medication to help the diabetes. She will let the patient know.  In the meantime, making healthy eating choices and getting regular exercise is important.  Please encourage the patient to activate My Chart.

## 2017-02-17 NOTE — Telephone Encounter (Signed)
Please advise 

## 2017-02-22 ENCOUNTER — Encounter: Payer: Self-pay | Admitting: Family Medicine

## 2017-02-27 ENCOUNTER — Telehealth: Payer: Self-pay | Admitting: Family Medicine

## 2017-02-27 ENCOUNTER — Other Ambulatory Visit: Payer: Self-pay

## 2017-02-27 NOTE — Telephone Encounter (Signed)
PLEASE CALL IN:  One touch ultra 2 test strips check sugar once daily #100 3 refills; once touch ultras 2 lancets check sugar once daily #100 3 refills.

## 2017-02-27 NOTE — Telephone Encounter (Signed)
Pt called saying that she missed a call from and [place] and is returning the call. We wanted to know the pharmacy that she wanted her RX to be sent to.  Please send the RX to [facility] on [address]

## 2017-03-02 NOTE — Telephone Encounter (Signed)
Prescription called in

## 2017-03-25 ENCOUNTER — Encounter: Payer: Self-pay | Admitting: Family Medicine

## 2017-03-31 MED ORDER — LANCETS MISC: 5 refills | Status: DC

## 2017-03-31 NOTE — Addendum Note (Signed)
Addended by: Wardell Honour on: 03/31/2017 01:56 PM   Modules accepted: Orders

## 2017-04-02 ENCOUNTER — Ambulatory Visit
Admission: RE | Admit: 2017-04-02 | Discharge: 2017-04-02 | Disposition: A | Discharge status: Home / Self Care | Payer: 59 | Source: Ambulatory Visit | Attending: Family Medicine | Admitting: Family Medicine

## 2017-04-02 DIAGNOSIS — E041 Nontoxic single thyroid nodule: Secondary | ICD-10-CM | POA: Diagnosis not present
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
## 2017-04-22 ENCOUNTER — Encounter: Payer: Self-pay | Admitting: Family Medicine

## 2017-04-22 ENCOUNTER — Other Ambulatory Visit: Payer: Self-pay

## 2017-04-22 ENCOUNTER — Ambulatory Visit (INDEPENDENT_AMBULATORY_CARE_PROVIDER_SITE_OTHER): Payer: 59 | Admitting: Family Medicine

## 2017-04-22 VITALS — BP 142/80 | HR 89 | Temp 97.8°F | Resp 16 | Ht 69.29 in | Wt 227.0 lb

## 2017-04-22 DIAGNOSIS — Z6832 Body mass index (BMI) 32.0-32.9, adult: Secondary | ICD-10-CM

## 2017-04-22 DIAGNOSIS — E119 Type 2 diabetes mellitus without complications: Secondary | ICD-10-CM | POA: Diagnosis not present

## 2017-04-22 DIAGNOSIS — I10 Essential (primary) hypertension: Secondary | ICD-10-CM | POA: Diagnosis not present

## 2017-04-22 DIAGNOSIS — E6609 Other obesity due to excess calories: Secondary | ICD-10-CM

## 2017-04-22 DIAGNOSIS — E041 Nontoxic single thyroid nodule: Secondary | ICD-10-CM | POA: Diagnosis not present

## 2017-04-22 LAB — POCT URINALYSIS DIP (MANUAL ENTRY)
Bilirubin, UA: NEGATIVE
GLUCOSE UA: NEGATIVE mg/dL
Leukocytes, UA: NEGATIVE
NITRITE UA: NEGATIVE
Protein Ur, POC: NEGATIVE mg/dL
Spec Grav, UA: 1.025 (ref 1.010–1.025)
Urobilinogen, UA: 0.2 E.U./dL
pH, UA: 6 (ref 5.0–8.0)

## 2017-04-22 LAB — POCT GLYCOSYLATED HEMOGLOBIN (HGB A1C): Hemoglobin A1C: 7.1

## 2017-04-22 LAB — GLUCOSE, POCT (MANUAL RESULT ENTRY): POC GLUCOSE: 128 mg/dL — AB (ref 70–99)

## 2017-04-22 NOTE — Patient Instructions (Addendum)
   IF you received an x-ray today, you will receive an invoice from [facility]. Please contact [facility] at [phone] with questions or concerns regarding your invoice.   IF you received labwork today, you will receive an invoice from [facility]. Please contact [facility] at [phone] with questions or concerns regarding your invoice.   Our billing staff will not be able to assist you with questions regarding bills from these companies.  You will be contacted with the lab results as soon as they are available. The fastest way to get your results is to activate your My Chart account. Instructions are located on the last page of this paperwork. If you have not heard from us regarding the results in 2 weeks, please contact this office.     Diabetes Mellitus and Nutrition When you have diabetes (diabetes mellitus), it is very important to have healthy eating habits because your blood sugar (glucose) levels are greatly affected by what you eat and drink. Eating healthy foods in the appropriate amounts, at about the same times every day, can help you:  Control your blood glucose.  Lower your risk of heart disease.  Improve your blood pressure.  Reach or maintain a healthy weight.  Every person with diabetes is different, and each person has different needs for a meal plan. Your health care provider may recommend that you work with a diet and nutrition specialist (dietitian) to make a meal plan that is best for you. Your meal plan may vary depending on factors such as:  The calories you need.  The medicines you take.  Your weight.  Your blood glucose, blood pressure, and cholesterol levels.  Your activity level.  Other health conditions you have, such as heart or kidney disease.  How do carbohydrates affect me? Carbohydrates affect your blood glucose level more than any other type of food. Eating carbohydrates naturally increases the amount of glucose in your  blood. Carbohydrate counting is a method for keeping track of how many carbohydrates you eat. Counting carbohydrates is important to keep your blood glucose at a healthy level, especially if you use insulin or take certain oral diabetes medicines. It is important to know how many carbohydrates you can safely have in each meal. This is different for every person. Your dietitian can help you calculate how many carbohydrates you should have at each meal and for snack. Foods that contain carbohydrates include:  Bread, cereal, rice, pasta, and crackers.  Potatoes and corn.  Peas, beans, and lentils.  Milk and yogurt.  Fruit and juice.  Desserts, such as cakes, cookies, ice cream, and candy.  How does alcohol affect me? Alcohol can cause a sudden decrease in blood glucose (hypoglycemia), especially if you use insulin or take certain oral diabetes medicines. Hypoglycemia can be a life-threatening condition. Symptoms of hypoglycemia (sleepiness, dizziness, and confusion) are similar to symptoms of having too much alcohol. If your health care provider says that alcohol is safe for you, follow these guidelines:  Limit alcohol intake to no more than 1 drink per day for nonpregnant women and 2 drinks per day for men. One drink equals 12 oz of beer, 5 oz of wine, or 1 oz of hard liquor.  Do not drink on an empty stomach.  Keep yourself hydrated with water, diet soda, or unsweetened iced tea.  Keep in mind that regular soda, juice, and other mixers may contain a lot of sugar and must be counted as carbohydrates.  What are tips for following   this plan? Reading food labels  Start by checking the serving size on the label. The amount of calories, carbohydrates, fats, and other nutrients listed on the label are based on one serving of the food. Many foods contain more than one serving per package.  Check the total grams (g) of carbohydrates in one serving. You can calculate the number of servings of  carbohydrates in one serving by dividing the total carbohydrates by 15. For example, if a food has 30 g of total carbohydrates, it would be equal to 2 servings of carbohydrates.  Check the number of grams (g) of saturated and trans fats in one serving. Choose foods that have low or no amount of these fats.  Check the number of milligrams (mg) of sodium in one serving. Most people should limit total sodium intake to less than 2,300 mg per day.  Always check the nutrition information of foods labeled as "low-fat" or "nonfat". These foods may be higher in added sugar or refined carbohydrates and should be avoided.  Talk to your dietitian to identify your daily goals for nutrients listed on the label. Shopping  Avoid buying canned, premade, or processed foods. These foods tend to be high in fat, sodium, and added sugar.  Shop around the outside edge of the grocery store. This includes fresh fruits and vegetables, bulk grains, fresh meats, and fresh dairy. Cooking  Use low-heat cooking methods, such as baking, instead of high-heat cooking methods like deep frying.  Cook using healthy oils, such as olive, canola, or sunflower oil.  Avoid cooking with butter, cream, or high-fat meats. Meal planning  Eat meals and snacks regularly, preferably at the same times every day. Avoid going long periods of time without eating.  Eat foods high in fiber, such as fresh fruits, vegetables, beans, and whole grains. Talk to your dietitian about how many servings of carbohydrates you can eat at each meal.  Eat 4-6 ounces of lean protein each day, such as lean meat, chicken, fish, eggs, or tofu. 1 ounce is equal to 1 ounce of meat, chicken, or fish, 1 egg, or 1/4 cup of tofu.  Eat some foods each day that contain healthy fats, such as avocado, nuts, seeds, and fish. Lifestyle   Check your blood glucose regularly.  Exercise at least 30 minutes 5 or more days each week, or as told by your health care  provider.  Take medicines as told by your health care provider.  Do not use any products that contain nicotine or tobacco, such as cigarettes and e-cigarettes. If you need help quitting, ask your health care provider.  Work with a counselor or diabetes educator to identify strategies to manage stress and any emotional and social challenges. What are some questions to ask my health care provider?  Do I need to meet with a diabetes educator?  Do I need to meet with a dietitian?  What number can I call if I have questions?  When are the best times to check my blood glucose? Where to find more information:  American Diabetes Association: diabetes.org/food-and-fitness/food  Academy of Nutrition and Dietetics: www.eatright.org/resources/health/diseases-and-conditions/diabetes  National Institute of Diabetes and Digestive and Kidney Diseases (NIH): www.niddk.nih.gov/health-information/diabetes/overview/diet-eating-physical-activity Summary  A healthy meal plan will help you control your blood glucose and maintain a healthy lifestyle.  Working with a diet and nutrition specialist (dietitian) can help you make a meal plan that is best for you.  Keep in mind that carbohydrates and alcohol have immediate effects on your blood glucose   glucose levels. It is important to count carbohydrates and to use alcohol carefully. This information is not intended to replace advice given to you by your health care provider. Make sure you discuss any questions you have with your health care provider. Document Released: 01/09/2005 Document Revised: 05/19/2016 Document Reviewed: 05/19/2016 Elsevier Interactive Patient Education  [employer].

## 2017-04-22 NOTE — Progress Notes (Signed)
Subjective:    Patient ID: Yvonne Fisher, female    DOB: 1976/04/19, 41 y.o.   MRN: [id number]  04/22/2017  Diabetes (follow-up ) and Hypertension    HPI This 41 y.o. female presents for three month follow-up of DMII and hypertension. Sugars [id number] (sweets).   Not taking Metformin every day.  Takes Metformin once day during work day. Not taking BP medication. Ran out and did not refill; losing weight.  Not checking BP at home.  Drinking coffee on empty stomach; will not eat for 45 minutes.  Eats one hour later; apples with protein bar.  Lunch: vegetables or spaghetti small portions.  Little food intake at Christmas. Collard greens, meatball and meatloaf.  Mac n cheese.  Yams.  Babysitter house.  Wine.    BP Readings from Last 3 Encounters:  04/22/17 (!) 142/80  01/20/17 130/82  10/22/16 138/82   Wt Readings from Last 3 Encounters:  04/22/17 227 lb (103 kg)  01/20/17 235 lb (106.6 kg)  10/22/16 236 lb (107 kg)   Immunization History  Administered Date(s) Administered  . Influenza Split 04/16/2011  . Influenza,inj,Quad PF,6+ Mos 02/01/2014, 04/04/2015, 03/18/2016, 01/20/2017  . Pneumococcal Polysaccharide-23 03/18/2016  . Tdap 06/15/2013    Review of Systems  Constitutional: Negative for chills, diaphoresis, fatigue and fever.  Eyes: Negative for visual disturbance.  Respiratory: Negative for cough and shortness of breath.   Cardiovascular: Negative for chest pain, palpitations and leg swelling.  Gastrointestinal: Negative for abdominal pain, constipation, diarrhea, nausea and vomiting.  Endocrine: Negative for cold intolerance, heat intolerance, polydipsia, polyphagia and polyuria.  Neurological: Negative for dizziness, tremors, seizures, syncope, facial asymmetry, speech difficulty, weakness, light-headedness, numbness and headaches.    Past Medical History:  Diagnosis Date  . Arthritis    right elbow   . Diabetes ([place])   . GERD (gastroesophageal reflux  disease)   . Gestational diabetes    diet controlled  . Hidradenitis 11/2011   left axilla  . Pregnancy induced hypertension 2015   "still keeping eye on it" (01/18/2014)  . Rash 12/01/2011   bilat. axilla  . Thyroid nodule    Past Surgical History:  Procedure Laterality Date  . CHOLECYSTECTOMY  04/15/2011   Procedure: LAPAROSCOPIC CHOLECYSTECTOMY WITH INTRAOPERATIVE CHOLANGIOGRAM;  Surgeon: Adin Hector, MD;  Location: [facility];  Service: General;  Laterality: N/A;  laparoscopic cholecystectomy with cholangiogram  . ELBOW ARTHROSCOPY  1997   right elbow  . HYDRADENITIS EXCISION  12/05/2011   Procedure: EXCISION HYDRADENITIS AXILLA;  Surgeon: Harl Bowie, MD;  Location: [facility];  Service: General;  Laterality: Left;  wide excision hidradenitis left axilla  . THYROID LOBECTOMY Left 01/18/2014  . THYROIDECTOMY Left 01/18/2014   Procedure: LEFT THYROID LOBECTOMY;  Surgeon: Coralie Keens, MD;  Location: [facility];  Service: General;  Laterality: Left;   Allergies  Allergen Reactions  . Adhesive [Tape] Other (See Comments)    TEARS SKIN  . Prevacid [Lansoprazole] Hives   Current Outpatient Medications on File Prior to Visit  Medication Sig Dispense Refill  . hydrochlorothiazide (HYDRODIURIL) 12.5 MG tablet Take 1 tablet (12.5 mg total) by mouth daily. 90 tablet 1  . Lancets MISC Check sugar once daily dx DMII 100 each 5  . metFORMIN (GLUCOPHAGE) 1000 MG tablet One tablet twice daily with meals 180 tablet 3   No current facility-administered medications on file prior to visit.    Social History   Socioeconomic History  . Marital status: Single  Spouse name: Not on file  . Number of children: 2  . Years of education: Not on file  . Highest education level: Not on file  Social Needs  . Financial resource strain: Not on file  . Food insecurity - worry: Not on file  . Food insecurity - inability: Not on file  . Transportation needs - medical: Not on file  .  Transportation needs - non-medical: Not on file  Occupational History    Comment: order processor  Tobacco Use  . Smoking status: Never Smoker  . Smokeless tobacco: Never Used  Substance and Sexual Activity  . Alcohol use: Yes    Comment: 01/18/2014 "might have a drink @ birthday party or holidays; not all the time"  . Drug use: No  . Sexual activity: No    Birth control/protection: None  Other Topics Concern  . Not on file  Social History Narrative   Marital status: single; dating seriously x 10 years; happy; no abuse.      Children:  (14 yo son, 45 week old boy).      Lives: with 2 sons, boyfriend.      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 Hypertension Mother   . Multiple sclerosis Mother   . Diabetes Father   . Heart disease Father 85       AMI       Objective:    BP (!) 142/80 (BP Location: Left Arm, Patient Position: Sitting, Cuff Size: Large)   Pulse 89   Temp 97.8 F (36.6 C) (Oral)   Resp 16   Ht 5' 9.29" (1.76 m)   Wt 227 lb (103 kg)   LMP 04/17/2017 (Approximate)   SpO2 98%   BMI 33.24 kg/m  Physical Exam  Constitutional: She is oriented to person, place, and time. She appears well-developed and well-nourished. No distress.  HENT:  Head: Normocephalic and atraumatic.  Right Ear: External ear normal.  Left Ear: External ear normal.  Nose: Nose normal.  Mouth/Throat: Oropharynx is clear and moist.  Eyes: Conjunctivae and EOM are normal. Pupils are equal, round, and reactive to light.  Neck: Normal range of motion. Neck supple. Carotid bruit is not present. No thyromegaly present.  Cardiovascular: Normal rate, regular rhythm, normal heart sounds and intact distal pulses. Exam reveals no gallop and no friction rub.  No murmur heard. Pulmonary/Chest: Effort normal and breath sounds normal. She has no  wheezes. She has no rales.  Abdominal: Soft. Bowel sounds are normal. She exhibits no distension and no mass. There is no tenderness. There is no rebound and no guarding.  Lymphadenopathy:    She has no cervical adenopathy.  Neurological: She is alert and oriented to person, place, and time. No cranial nerve deficit.  Skin: Skin is warm and dry. No rash noted. She is not diaphoretic. No erythema. No pallor.  Psychiatric: She has a normal mood and affect. Her behavior is normal.   No results found. Depression screen [facility] 2/9 04/22/2017 04/22/2017 01/20/2017 10/22/2016 07/23/2016  Decreased Interest 0 0 0 0 0  Down, Depressed, Hopeless 0 0 0 0 0  PHQ - 2 Score 0 0 0 0 0   Fall Risk  04/22/2017 04/22/2017 01/20/2017 10/22/2016 07/23/2016  Falls in the past year? No No No  No No   Results for orders placed or performed in visit on 04/22/17  POCT glycosylated hemoglobin (Hb A1C)  Result Value Ref Range   Hemoglobin A1C 7.1   POCT glucose (manual entry)  Result Value Ref Range   POC Glucose 128 (A) 70 - 99 mg/dl  POCT urinalysis dipstick  Result Value Ref Range   Color, UA yellow yellow   Clarity, UA clear clear   Glucose, UA negative negative mg/dL   Bilirubin, UA negative negative   Ketones, POC UA trace (5) (A) negative mg/dL   Spec Grav, UA 1.025 1.010 - 1.025   Blood, UA moderate (A) negative   pH, UA 6.0 5.0 - 8.0   Protein Ur, POC negative negative mg/dL   Urobilinogen, UA 0.2 0.2 or 1.0 E.U./dL   Nitrite, UA Negative Negative   Leukocytes, UA Negative Negative        Assessment & Plan:   1. Type 2 diabetes mellitus without complication, without long-term current use of insulin ([place])   2. Essential hypertension   3. Thyroid nodule   4. Class 1 obesity due to excess calories with serious comorbidity and body mass index (BMI) of 32.0 to 32.9 in adult     Diabetes mellitus type 2 with improved control with weight loss and dietary modification.  Taking metformin 5 days a week  during the work week.  Congratulations on weight loss during the holiday season.  Continue to work on weight loss, exercise, dietary modification.  Recommend monitoring blood sugar closely at home on a daily basis.  Hypertension moderately controlled.  Patient noncompliant with medication at this time and desires to improve blood pressure with further weight loss.  Goal blood pressure less than 130/80 due to diabetes mellitus type 2.  Encouraged checking blood pressure at home reassure her that blood pressures remaining less than 140/90 and ideally less than 130/80.  Congratulations on weight loss!  Recommend further weight loss, exercise for 30-60 minutes five days per week; recommend 1200 kcal restriction per day with a minimum of 60 grams of protein per day.   Orders Placed This Encounter  Procedures  . TSH  . Comprehensive metabolic panel  . Lipid panel  . Microalbumin, urine  . POCT glycosylated hemoglobin (Hb A1C)  . POCT glucose (manual entry)  . POCT urinalysis dipstick   No orders of the defined types were placed in this encounter.   Return in about 4 months (around 08/21/2017) for follow-up chronic medical conditions.   Stepheni Cameron Elayne Guerin, M.D. Primary Care at [facility] [address] [place], [place]  [address] [phone] phone [phone] fax

## 2017-04-23 LAB — LIPID PANEL
CHOL/HDL RATIO: 3.4 ratio (ref 0.0–4.4)
CHOLESTEROL TOTAL: 180 mg/dL (ref 100–199)
HDL: 53 mg/dL (ref >39)
LDL CALC: 103 mg/dL — AB (ref 0–99)
TRIGLYCERIDES: 121 mg/dL (ref 0–149)
VLDL CHOLESTEROL CAL: 24 mg/dL (ref 5–40)

## 2017-04-23 LAB — COMPREHENSIVE METABOLIC PANEL
ALK PHOS: 93 IU/L (ref 39–117)
ALT: 21 IU/L (ref 0–32)
AST: 14 IU/L (ref 0–40)
Albumin/Globulin Ratio: 1.5 (ref 1.2–2.2)
Albumin: 4 g/dL (ref 3.5–5.5)
BILIRUBIN TOTAL: 0.4 mg/dL (ref 0.0–1.2)
BUN/Creatinine Ratio: 13 (ref 9–23)
BUN: 11 mg/dL (ref 6–24)
CHLORIDE: 101 mmol/L (ref 96–106)
CO2: 24 mmol/L (ref 20–29)
Calcium: 9 mg/dL (ref 8.7–10.2)
Creatinine, Ser: 0.84 mg/dL (ref 0.57–1.00)
GFR calc non Af Amer: 85 mL/min/{1.73_m2} (ref >59)
GFR, EST AFRICAN AMERICAN: 98 mL/min/{1.73_m2} (ref >59)
GLUCOSE: 141 mg/dL — AB (ref 65–99)
Globulin, Total: 2.7 g/dL (ref 1.5–4.5)
Potassium: 4.2 mmol/L (ref 3.5–5.2)
Sodium: 140 mmol/L (ref 134–144)
TOTAL PROTEIN: 6.7 g/dL (ref 6.0–8.5)

## 2017-04-23 LAB — TSH: TSH: 0.401 u[IU]/mL — AB (ref 0.450–4.500)

## 2017-04-23 LAB — MICROALBUMIN, URINE: Microalbumin, Urine: 14.9 ug/mL

## 2017-08-24 ENCOUNTER — Ambulatory Visit (INDEPENDENT_AMBULATORY_CARE_PROVIDER_SITE_OTHER): Payer: 59 | Admitting: Family Medicine

## 2017-08-24 ENCOUNTER — Encounter: Payer: Self-pay | Admitting: Family Medicine

## 2017-08-24 ENCOUNTER — Other Ambulatory Visit: Payer: Self-pay

## 2017-08-24 VITALS — BP 148/80 | HR 108 | Temp 98.0°F | Resp 16 | Ht 68.5 in | Wt 224.0 lb

## 2017-08-24 DIAGNOSIS — I10 Essential (primary) hypertension: Secondary | ICD-10-CM | POA: Diagnosis not present

## 2017-08-24 DIAGNOSIS — E119 Type 2 diabetes mellitus without complications: Secondary | ICD-10-CM

## 2017-08-24 DIAGNOSIS — Z6832 Body mass index (BMI) 32.0-32.9, adult: Secondary | ICD-10-CM

## 2017-08-24 DIAGNOSIS — R3129 Other microscopic hematuria: Secondary | ICD-10-CM | POA: Diagnosis not present

## 2017-08-24 DIAGNOSIS — E6609 Other obesity due to excess calories: Secondary | ICD-10-CM

## 2017-08-24 DIAGNOSIS — E041 Nontoxic single thyroid nodule: Secondary | ICD-10-CM | POA: Diagnosis not present

## 2017-08-24 DIAGNOSIS — N921 Excessive and frequent menstruation with irregular cycle: Secondary | ICD-10-CM

## 2017-08-24 LAB — POCT GLYCOSYLATED HEMOGLOBIN (HGB A1C): HEMOGLOBIN A1C: 6.8

## 2017-08-24 LAB — GLUCOSE, POCT (MANUAL RESULT ENTRY): POC Glucose: 95 mg/dl (ref 70–99)

## 2017-08-24 MED ORDER — LANCETS MISC: 5 refills | Status: DC

## 2017-08-24 MED ORDER — METFORMIN HCL 1000 MG PO TABS: ORAL_TABLET | ORAL | 3 refills | Status: DC

## 2017-08-24 MED ORDER — HYDROCHLOROTHIAZIDE 12.5 MG PO TABS: 12.5000 mg | ORAL_TABLET | Freq: Every day | ORAL | 1 refills | Status: DC

## 2017-08-24 NOTE — Patient Instructions (Addendum)
   IF you received an x-ray today, you will receive an invoice from [facility]. Please contact [facility] at [phone] with questions or concerns regarding your invoice.   IF you received labwork today, you will receive an invoice from [facility]. Please contact [facility] at [phone] with questions or concerns regarding your invoice.   Our billing staff will not be able to assist you with questions regarding bills from these companies.  You will be contacted with the lab results as soon as they are available. The fastest way to get your results is to activate your My Chart account. Instructions are located on the last page of this paperwork. If you have not heard from us regarding the results in 2 weeks, please contact this office.     Diabetes Mellitus and Exercise Exercising regularly is important for your overall health, especially when you have diabetes (diabetes mellitus). Exercising is not only about losing weight. It has many health benefits, such as increasing muscle strength and bone density and reducing body fat and stress. This leads to improved fitness, flexibility, and endurance, all of which result in better overall health. Exercise has additional benefits for people with diabetes, including:  Reducing appetite.  Helping to lower and control blood glucose.  Lowering blood pressure.  Helping to control amounts of fatty substances (lipids) in the blood, such as cholesterol and triglycerides.  Helping the body to respond better to insulin (improving insulin sensitivity).  Reducing how much insulin the body needs.  Decreasing the risk for heart disease by: ? Lowering cholesterol and triglyceride levels. ? Increasing the levels of good cholesterol. ? Lowering blood glucose levels.  What is my activity plan? Your health care provider or certified diabetes educator can help you make a plan for the type and frequency of exercise (activity plan) that  works for you. Make sure that you:  Do at least 150 minutes of moderate-intensity or vigorous-intensity exercise each week. This could be brisk walking, biking, or water aerobics. ? Do stretching and strength exercises, such as yoga or weightlifting, at least 2 times a week. ? Spread out your activity over at least 3 days of the week.  Get some form of physical activity every day. ? Do not go more than 2 days in a row without some kind of physical activity. ? Avoid being inactive for more than 90 minutes at a time. Take frequent breaks to walk or stretch.  Choose a type of exercise or activity that you enjoy, and set realistic goals.  Start slowly, and gradually increase the intensity of your exercise over time.  What do I need to know about managing my diabetes?  Check your blood glucose before and after exercising. ? If your blood glucose is higher than 240 mg/dL (13.3 mmol/L) before you exercise, check your urine for ketones. If you have ketones in your urine, do not exercise until your blood glucose returns to normal.  Know the symptoms of low blood glucose (hypoglycemia) and how to treat it. Your risk for hypoglycemia increases during and after exercise. Common symptoms of hypoglycemia can include: ? Hunger. ? Anxiety. ? Sweating and feeling clammy. ? Confusion. ? Dizziness or feeling light-headed. ? Increased heart rate or palpitations. ? Blurry vision. ? Tingling or numbness around the mouth, lips, or tongue. ? Tremors or shakes. ? Irritability.  Keep a rapid-acting carbohydrate snack available before, during, and after exercise to help prevent or treat hypoglycemia.  Avoid injecting insulin into areas of the body   that are going to be exercised. For example, avoid injecting insulin into: ? The arms, when playing tennis. ? The legs, when jogging.  Keep records of your exercise habits. Doing this can help you and your health care provider adjust your diabetes management plan  as needed. Write down: ? Food that you eat before and after you exercise. ? Blood glucose levels before and after you exercise. ? The type and amount of exercise you have done. ? When your insulin is expected to peak, if you use insulin. Avoid exercising at times when your insulin is peaking.  When you start a new exercise or activity, work with your health care provider to make sure the activity is safe for you, and to adjust your insulin, medicines, or food intake as needed.  Drink plenty of water while you exercise to prevent dehydration or heat stroke. Drink enough fluid to keep your urine clear or pale yellow. This information is not intended to replace advice given to you by your health care provider. Make sure you discuss any questions you have with your health care provider. Document Released: 07/05/2003 Document Revised: 11/02/2015 Document Reviewed: 09/24/2015 [employer]vier Interactive Patient Education  2018 [employer]  

## 2017-08-24 NOTE — Progress Notes (Signed)
Subjective:    Patient ID: Yvonne Fisher, female    DOB: 1975-10-18, 42 y.o.   MRN: [id number]  08/24/2017  Diabetes (4 month follow-up ) and Hypertension    HPI This 42 y.o. female presents for four month follow-up of DMII, hypertension, obesity.  Management changes made at last visit include the following: Diabetes mellitus type 2 with improved control with weight loss and dietary modification.  Taking metformin 5 days a week during the work week.  Congratulations on weight loss during the holiday season.  Continue to work on weight loss, exercise, dietary modification.  Recommend monitoring blood sugar closely at home on a daily basis.  Hypertension moderately controlled.  Patient noncompliant with medication at this time and desires to improve blood pressure with further weight loss.  Goal blood pressure less than 130/80 due to diabetes mellitus type 2.  Encouraged checking blood pressure at home reassure her that blood pressures remaining less than 140/90 and ideally less than 130/80.  Congratulations on weight loss!  Recommend further weight loss, exercise for 30-60 minutes five days per week; recommend 1200 kcal restriction per day with a minimum of 60 grams of protein per day.  Labs from last visit:  Urine is normal. Thyroid function is borderline low. I recommend repeating at your next visit. Sugar or glucose is elevated at 141. Hemoglobin A1c has improved from 7.6-7.1. Keep up the great work! Liver and kidney functions are normal. Cholesterol is normal. Way to go!  UPDATE: Eating a little bit less. Family is good. Running with babies. Checked BP at church; up at first; then repeat was down. Sugar is running 120s-140s.  Not taking Bp medication. Taking Metformin two daily; tries to remember; good during the week. Heavy menses; ten days; spots.    BP Readings from Last 3 Encounters:  08/24/17 (!) 148/80  04/22/17 (!) 142/80  01/20/17 130/82   Wt Readings from  Last 3 Encounters:  08/24/17 224 lb (101.6 kg)  04/22/17 227 lb (103 kg)  01/20/17 235 lb (106.6 kg)   Immunization History  Administered Date(s) Administered  . Influenza Split 04/16/2011  . Influenza,inj,Quad PF,6+ Mos 02/01/2014, 04/04/2015, 03/18/2016, 01/20/2017  . Pneumococcal Polysaccharide-23 03/18/2016  . Tdap 06/15/2013    Review of Systems  Constitutional: Negative for chills, diaphoresis, fatigue and fever.  Eyes: Negative for visual disturbance.  Respiratory: Negative for cough and shortness of breath.   Cardiovascular: Negative for chest pain, palpitations and leg swelling.  Gastrointestinal: Negative for abdominal pain, constipation, diarrhea, nausea and vomiting.  Endocrine: Negative for cold intolerance, heat intolerance, polydipsia, polyphagia and polyuria.  Neurological: Negative for dizziness, tremors, seizures, syncope, facial asymmetry, speech difficulty, weakness, light-headedness, numbness and headaches.  Psychiatric/Behavioral: Negative for dysphoric mood. The patient is not nervous/anxious.     Past Medical History:  Diagnosis Date  . Arthritis    right elbow   . Diabetes ([place])   . GERD (gastroesophageal reflux disease)   . Gestational diabetes    diet controlled  . Hidradenitis 11/2011   left axilla  . Pregnancy induced hypertension 2015   "still keeping eye on it" (01/18/2014)  . Rash 12/01/2011   bilat. axilla  . Thyroid nodule    Past Surgical History:  Procedure Laterality Date  . CHOLECYSTECTOMY  04/15/2011   Procedure: LAPAROSCOPIC CHOLECYSTECTOMY WITH INTRAOPERATIVE CHOLANGIOGRAM;  Surgeon: Adin Hector, MD;  Location: [facility];  Service: General;  Laterality: N/A;  laparoscopic cholecystectomy with cholangiogram  . ELBOW ARTHROSCOPY  1997  right elbow  . HYDRADENITIS EXCISION  12/05/2011   Procedure: EXCISION HYDRADENITIS AXILLA;  Surgeon: Harl Bowie, MD;  Location: [place];  Service: General;  Laterality: Left;   wide excision hidradenitis left axilla  . THYROID LOBECTOMY Left 01/18/2014  . THYROIDECTOMY Left 01/18/2014   Procedure: LEFT THYROID LOBECTOMY;  Surgeon: Coralie Keens, MD;  Location: [place];  Service: General;  Laterality: Left;   Allergies  Allergen Reactions  . Adhesive [Tape] Other (See Comments)    TEARS SKIN  . Prevacid [Lansoprazole] Hives   No current outpatient medications on file prior to visit.   No current facility-administered medications on file prior to visit.    Social History   Socioeconomic History  . Marital status: Single    Spouse name: Not on file  . Number of children: 2  . Years of education: Not on file  . Highest education level: Not on file  Occupational History    Comment: order processor  Social Needs  . Financial resource strain: Not on file  . Food insecurity:    Worry: Not on file    Inability: Not on file  . Transportation needs:    Medical: Not on file    Non-medical: Not on file  Tobacco Use  . Smoking status: Never Smoker  . Smokeless tobacco: Never Used  Substance and Sexual Activity  . Alcohol use: Yes    Comment: 01/18/2014 "might have a drink @ birthday party or holidays; not all the time"  . Drug use: No  . Sexual activity: Never    Birth control/protection: None  Lifestyle  . Physical activity:    Days per week: Not on file    Minutes per session: Not on file  . Stress: Not on file  Relationships  . Social connections:    Talks on phone: Not on file    Gets together: Not on file    Attends religious service: Not on file    Active member of club or organization: Not on file    Attends meetings of clubs or organizations: Not on file    Relationship status: Not on file  . Intimate partner violence:    Fear of current or ex partner: Not on file    Emotionally abused: Not on file    Physically abused: Not on file    Forced sexual activity: Not on file  Other Topics Concern  . Not on file  Social History Narrative    Marital status: single; dating seriously x 10 years; happy; no abuse.      Children:  (61 yo son, 40 week old boy).      Lives: with 2 sons, boyfriend.      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 Hypertension Mother   . Multiple sclerosis Mother   . Diabetes Father   . Heart disease Father 60       AMI       Objective:    BP (!) 148/80   Pulse (!) 108   Temp 98 F (36.7 C) (Oral)   Resp 16   Ht 5' 8.5" (1.74 m)   Wt 224 lb (101.6 kg)   LMP 08/04/2017 (Approximate)   SpO2 99%   BMI 33.56 kg/m  Physical Exam  Constitutional: She is oriented to person, place, and time. She appears well-developed and well-nourished. No distress.  HENT:  Head: Normocephalic and atraumatic.  Right Ear: External ear normal.  Left Ear: External ear normal.  Nose: Nose normal.  Mouth/Throat: Oropharynx is clear and moist.  Eyes: Pupils are equal, round, and reactive to light. Conjunctivae and EOM are normal.  Neck: Normal range of motion. Neck supple. Carotid bruit is not present. No thyromegaly present.  Cardiovascular: Normal rate, regular rhythm, normal heart sounds and intact distal pulses. Exam reveals no gallop and no friction rub.  No murmur heard. Pulmonary/Chest: Effort normal and breath sounds normal. She has no wheezes. She has no rales.  Abdominal: Soft. Bowel sounds are normal. She exhibits no distension and no mass. There is no tenderness. There is no rebound and no guarding.  Lymphadenopathy:    She has no cervical adenopathy.  Neurological: She is alert and oriented to person, place, and time. No cranial nerve deficit.  Skin: Skin is warm and dry. No rash noted. She is not diaphoretic. No erythema. No pallor.  Psychiatric: She has a normal mood and affect. Her behavior is normal.   No results found. Depression screen [facility]  2/9 08/24/2017 04/22/2017 04/22/2017 01/20/2017 10/22/2016  Decreased Interest 0 0 0 0 0  Down, Depressed, Hopeless 0 0 0 0 0  PHQ - 2 Score 0 0 0 0 0   Fall Risk  08/24/2017 04/22/2017 04/22/2017 01/20/2017 10/22/2016  Falls in the past year? No No No No No        Assessment & Plan:   1. Essential hypertension   2. Type 2 diabetes mellitus without complication, without long-term current use of insulin (HCC)   3. Class 1 obesity due to excess calories with serious comorbidity and body mass index (BMI) of 32.0 to 32.9 in adult   4. Microscopic hematuria   5. Thyroid nodule   6. Menorrhagia with irregular cycle     HTN: uncontrolled due to non-compliance with medication; obtain labs; highly encourage compliance with HCTZ.  DMII; moderately controlled; hemoglobin A1c improved to 6.8 today; continue current dose of Metformin; continue to work on further weight loss, regular exercise.  Microscopic hematuria: New; obtain labs today; if microscopic hematuria present, refer to urology.  Thyroid nodule: stable; warrants repeat thyroid US every December until 2022.  Obtain TSH today.  Menorrhagia with irregular cycles: New; obtain labs including TSH. Recommend follow-up with OB/GYN.  Obesity with  BMI 32: Recommend weight loss, exercise for 30-60 minutes five days per week; recommend 1200 kcal restriction per day with a minimum of 60 grams of protein per day.  Eat 3 meals per day. Do not skip meals. Consider having a protein shake as a meal replacement to aid with eliminating meal skipping. Look for products with <220 calories, <7 gm sugar, and 20-30 gm protein.  Eat breakfast within 2 hours of getting up.   Make  your plate non-starchy vegetables,  protein, and  carbohydrates at lunch and dinner.   Aim for at least 64 oz. of calorie-free beverages daily (water, Crystal Light, diet green tea, etc.). Eliminate any sugary beverages such as regular soda, sweet tea, or fruit juice.   Pay  attention to hunger and fullness cues.  Stop eating once you feel satisfied; don't wait until you feel full, stuffed, or sick from eating.  Choose lean meats and low fat/fat free dairy products.  Choose foods high in fiber such as fruits, vegetables, and whole grains (  brown rice, whole wheat pasta, whole wheat bread, etc.).  Limit foods with added sugar to <7 gm per serving.  Always eat in the kitchen/dining room.  Never eat in the bedroom or in front of the TV.     Orders Placed This Encounter  Procedures  . Comprehensive metabolic panel    Order Specific Question:   Has the patient fasted?    Answer:   No  . Lipid panel    Order Specific Question:   Has the patient fasted?    Answer:   No  . TSH  . Urinalysis, dipstick only  . Microalbumin / creatinine urine ratio  . Urine Microscopic  . T4, free  . Urine Microscopic  . Ambulatory referral to Gynecology    Referral Priority:   Routine    Referral Type:   Consultation    Referral Reason:   Specialty Services Required    Requested Specialty:   Gynecology    Number of Visits Requested:   1  . POCT glucose (manual entry)  . POCT glycosylated hemoglobin (Hb A1C)   Meds ordered this encounter  Medications  . metFORMIN (GLUCOPHAGE) 1000 MG tablet    Sig: One tablet twice daily with meals    Dispense:  180 tablet    Refill:  3  . Lancets MISC    Sig: Check sugar once daily dx DMII    Dispense:  100 each    Refill:  5  . hydrochlorothiazide (HYDRODIURIL) 12.5 MG tablet    Sig: Take 1 tablet (12.5 mg total) by mouth daily.    Dispense:  90 tablet    Refill:  1    Return in about 4 months (around 12/24/2017) for follow-up chronic medical conditions DR. STALLINGS.   Federica Allport Elayne Guerin, M.D. Primary Care at [facility] [address] [place], [place]  [phone] phone [phone] fax

## 2017-08-25 LAB — LIPID PANEL
CHOL/HDL RATIO: 3.1 ratio (ref 0.0–4.4)
Cholesterol, Total: 188 mg/dL (ref 100–199)
HDL: 61 mg/dL (ref >39)
LDL Calculated: 113 mg/dL — ABNORMAL HIGH (ref 0–99)
Triglycerides: 71 mg/dL (ref 0–149)
VLDL Cholesterol Cal: 14 mg/dL (ref 5–40)

## 2017-08-25 LAB — URINALYSIS, DIPSTICK ONLY
BILIRUBIN UA: NEGATIVE
GLUCOSE, UA: NEGATIVE
KETONES UA: NEGATIVE
Leukocytes, UA: NEGATIVE
Nitrite, UA: NEGATIVE
PROTEIN UA: NEGATIVE
Specific Gravity, UA: 1.016 (ref 1.005–1.030)
UUROB: 0.2 mg/dL (ref 0.2–1.0)
pH, UA: 5.5 (ref 5.0–7.5)

## 2017-08-25 LAB — MICROALBUMIN / CREATININE URINE RATIO
CREATININE, UR: 100.3 mg/dL
MICROALB/CREAT RATIO: 13 mg/g{creat} (ref 0.0–30.0)
MICROALBUM., U, RANDOM: 13 ug/mL

## 2017-08-25 LAB — COMPREHENSIVE METABOLIC PANEL
A/G RATIO: 1.6 (ref 1.2–2.2)
ALBUMIN: 4.2 g/dL (ref 3.5–5.5)
ALT: 23 IU/L (ref 0–32)
AST: 21 IU/L (ref 0–40)
Alkaline Phosphatase: 90 IU/L (ref 39–117)
BUN/Creatinine Ratio: 13 (ref 9–23)
BUN: 11 mg/dL (ref 6–24)
Bilirubin Total: 0.4 mg/dL (ref 0.0–1.2)
CALCIUM: 9.4 mg/dL (ref 8.7–10.2)
CO2: 23 mmol/L (ref 20–29)
Chloride: 100 mmol/L (ref 96–106)
Creatinine, Ser: 0.84 mg/dL (ref 0.57–1.00)
GFR, EST AFRICAN AMERICAN: 98 mL/min/{1.73_m2} (ref >59)
GFR, EST NON AFRICAN AMERICAN: 85 mL/min/{1.73_m2} (ref >59)
GLOBULIN, TOTAL: 2.7 g/dL (ref 1.5–4.5)
Glucose: 94 mg/dL (ref 65–99)
POTASSIUM: 4.5 mmol/L (ref 3.5–5.2)
SODIUM: 136 mmol/L (ref 134–144)
TOTAL PROTEIN: 6.9 g/dL (ref 6.0–8.5)

## 2017-08-25 LAB — T4, FREE: FREE T4: 1.09 ng/dL (ref 0.82–1.77)

## 2017-08-25 LAB — URINALYSIS, MICROSCOPIC ONLY: Casts: NONE SEEN /lpf

## 2017-08-25 LAB — TSH: TSH: 1.09 u[IU]/mL (ref 0.450–4.500)

## 2017-09-17 ENCOUNTER — Encounter: Payer: Self-pay | Admitting: Family Medicine

## 2017-11-19 ENCOUNTER — Telehealth: Payer: Self-pay | Admitting: Family Medicine

## 2017-11-19 NOTE — Telephone Encounter (Signed)
LVM for pt to reschedule her appt with Nolon Rod that is on 12/07/17.  If pt calls back, please reschedule her for a different day with [facility].  Thank you

## 2017-12-07 ENCOUNTER — Ambulatory Visit: Payer: 59 | Admitting: Family Medicine

## 2017-12-14 ENCOUNTER — Encounter: Payer: Self-pay | Admitting: Family Medicine

## 2017-12-14 ENCOUNTER — Other Ambulatory Visit: Payer: Self-pay

## 2017-12-14 ENCOUNTER — Ambulatory Visit (INDEPENDENT_AMBULATORY_CARE_PROVIDER_SITE_OTHER): Payer: 59 | Admitting: Family Medicine

## 2017-12-14 VITALS — BP 138/86 | HR 99 | Temp 99.0°F | Resp 16 | Ht 68.5 in | Wt 224.6 lb

## 2017-12-14 DIAGNOSIS — E6609 Other obesity due to excess calories: Secondary | ICD-10-CM

## 2017-12-14 DIAGNOSIS — E119 Type 2 diabetes mellitus without complications: Secondary | ICD-10-CM | POA: Diagnosis not present

## 2017-12-14 DIAGNOSIS — Z6832 Body mass index (BMI) 32.0-32.9, adult: Secondary | ICD-10-CM

## 2017-12-14 DIAGNOSIS — I10 Essential (primary) hypertension: Secondary | ICD-10-CM

## 2017-12-14 MED ORDER — HYDROCHLOROTHIAZIDE 12.5 MG PO TABS: 12.5000 mg | ORAL_TABLET | Freq: Every day | ORAL | 1 refills | Status: DC

## 2017-12-14 MED ORDER — METFORMIN HCL 500 MG PO TABS: 500.0000 mg | ORAL_TABLET | Freq: Every day | ORAL | 3 refills | Status: DC

## 2017-12-14 NOTE — Progress Notes (Signed)
Chief Complaint  Patient presents with  . f/u chronic medical conditions    establish care with dr Nolon Rod    HPI  Diabetes Mellitus Pt reports that she has been well controlled with her diabetes Lab Results  Component Value Date   HGBA1C 6.8 08/24/2017   She reports that she has been compliant with her medications She takes only 500mg  of metformin once a day She exercises daily She sticks to an ADA diet She eats high protein and cut back on starches   Obesity- pt reports that she has been holding steady with her weight She stats that she is working hard to lose weight to get off medications Wt Readings from Last 3 Encounters:  12/14/17 224 lb 9.6 oz (101.9 kg)  08/24/17 224 lb (101.6 kg)  04/22/17 227 lb (103 kg)   Essential Hypertension BP Readings from Last 3 Encounters:  12/14/17 138/86  08/24/17 (!) 148/80  04/22/17 (!) 142/80   Pt denies chest pain, palpitations, LE edema She is compliant with her bp meds She avoids salty foods and processed meats  She does not take any nsaids     Past Medical History:  Diagnosis Date  . Arthritis    right elbow   . Diabetes ([place])   . GERD (gastroesophageal reflux disease)   . Gestational diabetes    diet controlled  . Hidradenitis 11/2011   left axilla  . Pregnancy induced hypertension 2015   "still keeping eye on it" (01/18/2014)  . Rash 12/01/2011   bilat. axilla  . Thyroid nodule     Current Outpatient Medications  Medication Sig Dispense Refill  . hydrochlorothiazide (HYDRODIURIL) 12.5 MG tablet Take 1 tablet (12.5 mg total) by mouth daily. 90 tablet 1  . Lancets MISC Check sugar once daily dx DMII 100 each 5  . metFORMIN (GLUCOPHAGE) 500 MG tablet Take 1 tablet (500 mg total) by mouth daily with breakfast. One tablet twice daily with meals 90 tablet 3   No current facility-administered medications for this visit.     Allergies:  Allergies  Allergen Reactions  . Adhesive [Tape] Other (See Comments)   TEARS SKIN  . Prevacid [Lansoprazole] Hives    Past Surgical History:  Procedure Laterality Date  . CHOLECYSTECTOMY  04/15/2011   Procedure: LAPAROSCOPIC CHOLECYSTECTOMY WITH INTRAOPERATIVE CHOLANGIOGRAM;  Surgeon: Adin Hector, MD;  Location: [facility];  Service: General;  Laterality: N/A;  laparoscopic cholecystectomy with cholangiogram  . ELBOW ARTHROSCOPY  1997   right elbow  . HYDRADENITIS EXCISION  12/05/2011   Procedure: EXCISION HYDRADENITIS AXILLA;  Surgeon: Harl Bowie, MD;  Location: [place];  Service: General;  Laterality: Left;  wide excision hidradenitis left axilla  . THYROID LOBECTOMY Left 01/18/2014  . THYROIDECTOMY Left 01/18/2014   Procedure: LEFT THYROID LOBECTOMY;  Surgeon: Coralie Keens, MD;  Location: [place];  Service: General;  Laterality: Left;    Social History   Socioeconomic History  . Marital status: Single    Spouse name: Not on file  . Number of children: 2  . Years of education: Not on file  . Highest education level: Not on file  Occupational History    Comment: order processor  Social Needs  . Financial resource strain: Not on file  . Food insecurity:    Worry: Not on file    Inability: Not on file  . Transportation needs:    Medical: Not on file    Non-medical: Not on file  Tobacco Use  .  Smoking status: Never Smoker  . Smokeless tobacco: Never Used  Substance and Sexual Activity  . Alcohol use: Yes    Comment: 01/18/2014 "might have a drink @ birthday party or holidays; not all the time"  . Drug use: No  . Sexual activity: Never    Birth control/protection: None  Lifestyle  . Physical activity:    Days per week: Not on file    Minutes per session: Not on file  . Stress: Not on file  Relationships  . Social connections:    Talks on phone: Not on file    Gets together: Not on file    Attends religious service: Not on file    Active member of club or organization: Not on file    Attends meetings of clubs or  organizations: Not on file    Relationship status: Not on file  Other Topics Concern  . Not on file  Social History Narrative   Marital status: single; dating seriously x 10 years; happy; no abuse.      Children:  (63 yo son, 46 week old boy).      Lives: with 2 sons, boyfriend.      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 Hypertension Mother   . Multiple sclerosis Mother   . Diabetes Father   . Heart disease Father 73       AMI     ROS Review of Systems See HPI Constitution: No fevers or chills No malaise No diaphoresis Skin: No rash or itching Eyes: no blurry vision, no double vision GU: no dysuria or hematuria Neuro: no dizziness or headaches all others reviewed and negative   Objective: Vitals:   12/14/17 1534 12/14/17 1610  BP: (!) 158/91 138/86  Pulse: 99   Resp: 16   Temp: 99 F (37.2 C)   TempSrc: Oral   SpO2: 100%   Weight: 224 lb 9.6 oz (101.9 kg)   Height: 5' 8.5" (1.74 m)     Physical Exam Physical Exam  Constitutional: She is oriented to person, place, and time. She appears well-developed and well-nourished.  HENT:  Head: Normocephalic and atraumatic.  Eyes: Conjunctivae and EOM are normal.  Cardiovascular: Normal rate, regular rhythm and normal heart sounds.   Pulmonary/Chest: Effort normal and breath sounds normal. No respiratory distress. She has no wheezes.  Abdominal: Normal appearance and bowel sounds are normal. There is no tenderness. There is no CVA tenderness.  Neurological: She is alert and oriented to person, place, and time.    Assessment and Plan Orra was seen today for f/u chronic medical conditions.  Diagnoses and all orders for this visit:  Essential hypertension -     Lipid panel; Future -     Hemoglobin A1c; Future -     Comprehensive metabolic panel;  Future Patient's blood pressure is at goal of 139/89 or less. Condition is stable. Continue current medications and treatment plan. I recommend that you exercise for 30-45 minutes 5 days a week. I also recommend a balanced diet with fruits and vegetables every day, lean meats, and little fried foods. The DASH diet (you can find this online) is a good example of this.   Type 2 diabetes mellitus without complication, without long-term current use of insulin (HCC) -  Lipid panel; Future -     Hemoglobin A1c; Future -     Comprehensive metabolic panel; Future Plan to discontinue metformin if her A1c remains at goal with just 500mg  of metformin daily Discussed goals Continue ADA diet  Class 1 obesity due to excess calories with serious comorbidity and body mass index (BMI) of 32.0 to 32.9 in adult - stabilized Discussed diet and exercise Continue metformin for weight mgmt -     Lipid panel; Future -     Hemoglobin A1c; Future -     Comprehensive metabolic panel; Future  Other orders -     metFORMIN (GLUCOPHAGE) 500 MG tablet; Take 1 tablet (500 mg total) by mouth daily with breakfast. One tablet twice daily with meals -     hydrochlorothiazide (HYDRODIURIL) 12.5 MG tablet; Take 1 tablet (12.5 mg total) by mouth daily.     [place]

## 2017-12-14 NOTE — Patient Instructions (Addendum)
If you have lab work done today you will be contacted with your lab results within the next 2 weeks.  If you have not heard from [place] then please contact us. The fastest way to get your results is to register for My Chart.   IF you received an x-ray today, you will receive an invoice from [facility]. Please contact [facility] at [phone] with questions or concerns regarding your invoice.   IF you received labwork today, you will receive an invoice from [place]. Please contact [facility] at [phone] with questions or concerns regarding your invoice.   Our billing staff will not be able to assist you with questions regarding bills from these companies.  You will be contacted with the lab results as soon as they are available. The fastest way to get your results is to activate your My Chart account. Instructions are located on the last page of this paperwork. If you have not heard from [place] regarding the results in 2 weeks, please contact this office.     Diabetes Mellitus and Exercise Exercising regularly is important for your overall health, especially when you have diabetes (diabetes mellitus). Exercising is not only about losing weight. It has many health benefits, such as increasing muscle strength and bone density and reducing body fat and stress. This leads to improved fitness, flexibility, and endurance, all of which result in better overall health. Exercise has additional benefits for people with diabetes, including:  Reducing appetite.  Helping to lower and control blood glucose.  Lowering blood pressure.  Helping to control amounts of fatty substances (lipids) in the blood, such as cholesterol and triglycerides.  Helping the body to respond better to insulin (improving insulin sensitivity).  Reducing how much insulin the body needs.  Decreasing the risk for heart disease by: ? Lowering cholesterol and triglyceride levels. ? Increasing the levels  of good cholesterol. ? Lowering blood glucose levels.  What is my activity plan? Your health care provider or certified diabetes educator can help you make a plan for the type and frequency of exercise (activity plan) that works for you. Make sure that you:  Do at least 150 minutes of moderate-intensity or vigorous-intensity exercise each week. This could be brisk walking, biking, or water aerobics. ? Do stretching and strength exercises, such as yoga or weightlifting, at least 2 times a week. ? Spread out your activity over at least 3 days of the week.  Get some form of physical activity every day. ? Do not go more than 2 days in a row without some kind of physical activity. ? Avoid being inactive for more than 90 minutes at a time. Take frequent breaks to walk or stretch.  Choose a type of exercise or activity that you enjoy, and set realistic goals.  Start slowly, and gradually increase the intensity of your exercise over time.  What do I need to know about managing my diabetes?  Check your blood glucose before and after exercising. ? If your blood glucose is higher than 240 mg/dL (13.3 mmol/L) before you exercise, check your urine for ketones. If you have ketones in your urine, do not exercise until your blood glucose returns to normal.  Know the symptoms of low blood glucose (hypoglycemia) and how to treat it. Your risk for hypoglycemia increases during and after exercise. Common symptoms of hypoglycemia can include: ? Hunger. ? Anxiety. ? Sweating and feeling clammy. ? Confusion. ? Dizziness or feeling light-headed. ? Increased heart rate or  palpitations. ? Blurry vision. ? Tingling or numbness around the mouth, lips, or tongue. ? Tremors or shakes. ? Irritability.  Keep a rapid-acting carbohydrate snack available before, during, and after exercise to help prevent or treat hypoglycemia.  Avoid injecting insulin into areas of the body that are going to be exercised. For  example, avoid injecting insulin into: ? The arms, when playing tennis. ? The legs, when jogging.  Keep records of your exercise habits. Doing this can help you and your health care provider adjust your diabetes management plan as needed. Write down: ? Food that you eat before and after you exercise. ? Blood glucose levels before and after you exercise. ? The type and amount of exercise you have done. ? When your insulin is expected to peak, if you use insulin. Avoid exercising at times when your insulin is peaking.  When you start a new exercise or activity, work with your health care provider to make sure the activity is safe for you, and to adjust your insulin, medicines, or food intake as needed.  Drink plenty of water while you exercise to prevent dehydration or heat stroke. Drink enough fluid to keep your urine clear or pale yellow. This information is not intended to replace advice given to you by your health care provider. Make sure you discuss any questions you have with your health care provider. Document Released: 07/05/2003 Document Revised: 11/02/2015 Document Reviewed: 09/24/2015 Elsevier Interactive Patient Education  2018 [employer].

## 2017-12-24 ENCOUNTER — Ambulatory Visit: Payer: 59 | Admitting: Family Medicine

## 2018-02-19 DIAGNOSIS — Z30431 Encounter for routine checking of intrauterine contraceptive device: Secondary | ICD-10-CM | POA: Diagnosis not present

## 2018-02-19 DIAGNOSIS — Z1231 Encounter for screening mammogram for malignant neoplasm of breast: Secondary | ICD-10-CM | POA: Diagnosis not present

## 2018-02-19 DIAGNOSIS — R87613 High grade squamous intraepithelial lesion on cytologic smear of cervix (HGSIL): Secondary | ICD-10-CM | POA: Diagnosis not present

## 2018-02-19 DIAGNOSIS — Z01419 Encounter for gynecological examination (general) (routine) without abnormal findings: Secondary | ICD-10-CM | POA: Diagnosis not present

## 2018-02-19 LAB — HM MAMMOGRAPHY

## 2018-02-19 LAB — HM PAP SMEAR: HM Pap smear: POSITIVE

## 2018-03-15 ENCOUNTER — Ambulatory Visit (INDEPENDENT_AMBULATORY_CARE_PROVIDER_SITE_OTHER): Payer: 59 | Admitting: Family Medicine

## 2018-03-15 ENCOUNTER — Encounter: Payer: Self-pay | Admitting: Family Medicine

## 2018-03-15 ENCOUNTER — Other Ambulatory Visit: Payer: Self-pay

## 2018-03-15 VITALS — BP 165/101 | HR 78 | Temp 98.1°F | Resp 17 | Ht 68.5 in | Wt 228.6 lb

## 2018-03-15 DIAGNOSIS — E1159 Type 2 diabetes mellitus with other circulatory complications: Secondary | ICD-10-CM | POA: Diagnosis not present

## 2018-03-15 DIAGNOSIS — E6609 Other obesity due to excess calories: Secondary | ICD-10-CM | POA: Diagnosis not present

## 2018-03-15 DIAGNOSIS — Z6834 Body mass index (BMI) 34.0-34.9, adult: Secondary | ICD-10-CM | POA: Diagnosis not present

## 2018-03-15 DIAGNOSIS — I152 Hypertension secondary to endocrine disorders: Secondary | ICD-10-CM

## 2018-03-15 DIAGNOSIS — I10 Essential (primary) hypertension: Secondary | ICD-10-CM | POA: Diagnosis not present

## 2018-03-15 MED ORDER — LISINOPRIL-HYDROCHLOROTHIAZIDE 10-12.5 MG PO TABS: 1.0000 | ORAL_TABLET | Freq: Every day | ORAL | 0 refills | Status: DC

## 2018-03-15 NOTE — Progress Notes (Signed)
Chief Complaint  Patient presents with  . Diabetes    3 month f/u    HPI Hypertension She reports that she has not taken her bp medicaiton HCTZ She reports that she stopped her medication and wanted to see if she needed it She states that she had pregnancy induced hypertension in 2015 She reports that she does not smoke During her pregnancy she did not require medications She has been on medications for hypertension for a year She sticks to a DASH diet She is not exercising but is not sedentary. She denies chest pains, lower extremity edema, palpitations She denies snoring but reports some daytime drowsiness which she attributes to lack of rest.   The 10-year ASCVD risk score Mikey Bussing DC Brooke Bonito., et al., 2013) is: 13%   Values used to calculate the score:     Age: 28 years     Sex: Female     Is Non-Hispanic African American: Yes     Diabetic: Yes     Tobacco smoker: No     Systolic Blood Pressure: 767 mmHg     Is BP treated: Yes     HDL Cholesterol: 61 mg/dL     Total Cholesterol: 188 mg/dL   Diabetes Mellitus: Patient presents for follow up of diabetes. Symptoms: none. Symptoms have stabilized. Patient denies hyperglycemia and hypoglycemia .  Evaluation to date has been included: hemoglobin A1C.  Home sugars: patient does not check sugars. Treatment to date: diet Patient is not taking her metformin. She states that she "barely takes it". Lab Results  Component Value Date   HGBA1C 6.8 08/24/2017    Class 1 Obesity She has been eating more fruits and cutting back her calories She reports that she eats beef and other meat products  She eats some sphaghetti  She reports that Body mass index is 34.25 kg/m. Wt Readings from Last 3 Encounters:  03/15/18 228 lb 9.6 oz (103.7 kg)  12/14/17 224 lb 9.6 oz (101.9 kg)  08/24/17 224 lb (101.6 kg)     Past Medical History:  Diagnosis Date  . Arthritis    right elbow   . Diabetes ([place])   . GERD (gastroesophageal reflux disease)    . Gestational diabetes    diet controlled  . Hidradenitis 11/2011   left axilla  . Pregnancy induced hypertension 2015   "still keeping eye on it" (01/18/2014)  . Rash 12/01/2011   bilat. axilla  . Thyroid nodule     Current Outpatient Medications  Medication Sig Dispense Refill  . Lancets MISC Check sugar once daily dx DMII 100 each 5  . metFORMIN (GLUCOPHAGE) 500 MG tablet Take 1 tablet (500 mg total) by mouth daily with breakfast. One tablet twice daily with meals 90 tablet 3  . lisinopril-hydrochlorothiazide (PRINZIDE,ZESTORETIC) 10-12.5 MG tablet Take 1 tablet by mouth daily. 90 tablet 0   No current facility-administered medications for this visit.     Allergies:  Allergies  Allergen Reactions  . Adhesive [Tape] Other (See Comments)    TEARS SKIN  . Prevacid [Lansoprazole] Hives    Past Surgical History:  Procedure Laterality Date  . CHOLECYSTECTOMY  04/15/2011   Procedure: LAPAROSCOPIC CHOLECYSTECTOMY WITH INTRAOPERATIVE CHOLANGIOGRAM;  Surgeon: Adin Hector, MD;  Location: [facility];  Service: General;  Laterality: N/A;  laparoscopic cholecystectomy with cholangiogram  . ELBOW ARTHROSCOPY  1997   right elbow  . HYDRADENITIS EXCISION  12/05/2011   Procedure: EXCISION HYDRADENITIS AXILLA;  Surgeon: Harl Bowie, MD;  Location: [facility];  Service: General;  Laterality: Left;  wide excision hidradenitis left axilla  . THYROID LOBECTOMY Left 01/18/2014  . THYROIDECTOMY Left 01/18/2014   Procedure: LEFT THYROID LOBECTOMY;  Surgeon: Coralie Keens, MD;  Location: [facility];  Service: General;  Laterality: Left;    Social History   Socioeconomic History  . Marital status: Single    Spouse name: Not on file  . Number of children: 2  . Years of education: Not on file  . Highest education level: Not on file  Occupational History    Comment: order processor  Social Needs  . Financial resource strain: Not on file  . Food insecurity:    Worry: Not on  file    Inability: Not on file  . Transportation needs:    Medical: Not on file    Non-medical: Not on file  Tobacco Use  . Smoking status: Never Smoker  . Smokeless tobacco: Never Used  Substance and Sexual Activity  . Alcohol use: Yes    Comment: 01/18/2014 "might have a drink @ birthday party or holidays; not all the time"  . Drug use: No  . Sexual activity: Never    Birth control/protection: None  Lifestyle  . Physical activity:    Days per week: Not on file    Minutes per session: Not on file  . Stress: Not on file  Relationships  . Social connections:    Talks on phone: Not on file    Gets together: Not on file    Attends religious service: Not on file    Active member of club or organization: Not on file    Attends meetings of clubs or organizations: Not on file    Relationship status: Not on file  Other Topics Concern  . Not on file  Social History Narrative   Marital status: single; dating seriously x 10 years; happy; no abuse.      Children:  (12 yo son, 60 week old boy).      Lives: with 2 sons, boyfriend.      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 Hypertension Mother   . Multiple sclerosis Mother   . Diabetes Father   . Heart disease Father 100       AMI     ROS Review of Systems See HPI Constitution: No fevers or chills No malaise No diaphoresis Skin: No rash or itching Eyes: no blurry vision, no double vision GU: no dysuria or hematuria Neuro: no dizziness or headaches all others reviewed and negative   Objective: Vitals:   03/15/18 1006  BP: (!) 165/101  Pulse: 78  Resp: 17  Temp: 98.1 F (36.7 C)  TempSrc: Oral  SpO2: 99%  Weight: 228 lb 9.6 oz (103.7 kg)  Height: 5' 8.5" (1.74 m)    Physical Exam Physical Exam  Constitutional: He is oriented to person, place, and time. He  appears well-developed and well-nourished.  HENT:  Head: Normocephalic and atraumatic.  Eyes: Conjunctivae and EOM are normal.  Cardiovascular: Normal rate, regular rhythm, normal heart sounds and intact distal pulses.  No murmur heard. Pulmonary/Chest: Effort normal and breath sounds normal. No stridor. No respiratory distress. He has no wheezes.  Neurological: He is alert and oriented to person, place, and time.  Skin: Skin is warm. Capillary refill takes less than 2 seconds.  Psychiatric: He has a normal mood and affect. His behavior is normal. Judgment and thought content normal.   Assessment and Plan Valaria was seen today for diabetes.  Diagnoses and all orders for this visit:  Essential hypertension- Patient's noncompliant with HCTZ Discussed ASCVD risk Discussed prevention of end stage heart disease -     Comprehensive metabolic panel -     Lipid panel -     lisinopril-hydrochlorothiazide (PRINZIDE,ZESTORETIC) 10-12.5 MG tablet; Take 1 tablet by mouth daily.  Hypertension associated with type 2 diabetes mellitus ([place])-  Pt also noncompliant with metformin  She is sticking to a better diet but needs more education about appropriate eating Discussed goals for A1C of <7 Added ACE inhibitor today -     Hemoglobin A1c -     lisinopril-hydrochlorothiazide (PRINZIDE,ZESTORETIC) 10-12.5 MG tablet; Take 1 tablet by mouth daily.  Class 1 obesity due to excess calories with serious comorbidity and body mass index (BMI) of 34.0 to 34.9 in adult- worsening weight due to insufficient intake of plant based proteins and healthy fats      [employer] Kindt A Nolon Rod

## 2018-03-15 NOTE — Patient Instructions (Addendum)
Low Glycemic Foods (20-49)  Breakfast Cereals: All-Bran                All-Bran Fruit ' n Oats Fiber One               Oatmeal (not instant)  Oat bran Fruits and fruit juices: (Limit to 1-2 servings per day) Apples               Apricots (fresh & dried)  Blackberries            Blueberries Cherries                  Cranberries             Peaches                  Pears                       Plums                       Prunes Grapefruit                Raspberries            Strawberries           Tangerine Apple juice            Tomato juice  Beans and legumes (fresh-cooked): Black-eyed peas     Butter beans Chick peas              Lentils     Green beans           Lima beans               Kidney beans          Navy beans  Non-starchy vegetables: Asparagus, bok choy, broccoli, cabbage, cauliflower, celery, cucumber, greens, lettuce, mushrooms, peppers, tomatoes, okra, onions, snow peas, spinach, summer squash  Grains: Barley                                Bulgur Rye                                    Wild rice  Nuts and oils : Almonds         Peanuts     Sunflower seeds  Hazelnuts      Pecans          Walnuts Oils that are liquid at room temperature  Dairy, fish, and meat: Milk, skim                         Lowfat cheese Yogurt, lowfat, fruit sugar sweetened Lean red meat                      Fish  Skinless chicken & [place]    Shellfish Moderate Glycemic Foods (50-69)  Breakfast Cereals: Bran Buds                             Bran Chex Just Right  Mini-Wheats Special K         Swiss muesli  Fruits: Banana (under-ripe)             Dates Figs                                      Grapes Kiwi                                      Mango Oranges                               Raisins  Fruit Juices: Cranberry juice                    Orange juice  Beans and legumes: [place]-type baked beans Canned pinto, kidney, or navy beans Green  peas  Vegetables: Beets                         Raw Carrots  Sweet potato              Yam Corn on the cob  Breads: Pita (pocket) bread          Oat bran bread Pumpernickel bread           Rye bread Wheat bread, high fiber       Grains: Cornmeal                           Rice, brown   Rice, white                         Couscous Pasta: Macaroni                           Pizza  cheese Raviolimeat filled           Spaghetti, white        Nuts: Cashews                           Macadamia  Snacks: Chocolate                    Ice cream,lowfat  Muffin                               Popcorn High Glycemic Foods (70-100)   Breakfast Cereals: Cheerios                 Corn Chex Corn Flakes            Cream of Wheat Grape Nuts              Grape Nut Flakes Life                 Nutri-Grain       Puffed Rice               Puffed Wheat Rice Chex                 Rice Krispies Shredded Wheat  Team Total Fruits: Pineapple                 Watermelon Banana (over-ripe)  Beverages: Sodas, sweet tea, pineapple juice  Vegetables: Potato, baked, boiled, fried, mashed [place] fries Canned or frozen corn Cooked carrots Parsnips Winter squash  Breads: Most breads (white and whole grain) Bagels                     Bread sticks Bread stuffing          Kaiser roll Dinner rolls  Grains: Rice, instant          Tapioca, with milk  Candy and most cookies Snacks: Donuts                      Corn chips        Jelly beans                 Pretzels Pastries                             Restaurant and ethnic foods Most [place] food (sugar in stir fry or wok sauces) Teriyaki-style meats and vegetables      If you have lab work done today you will be contacted with your lab results within the next 2 weeks.  If you have not heard from [place] then please contact us. The fastest way to get your results is to register for My Chart.   IF you received an x-ray today, you will  receive an invoice from [facility]. Please contact [facility] at [phone] with questions or concerns regarding your invoice.   IF you received labwork today, you will receive an invoice from [facility]. Please contact [facility] at [phone] with questions or concerns regarding your invoice.   Our billing staff will not be able to assist you with questions regarding bills from these companies.  You will be contacted with the lab results as soon as they are available. The fastest way to get your results is to activate your My Chart account. Instructions are located on the last page of this paperwork. If you have not heard from [place] regarding the results in 2 weeks, please contact this office.

## 2018-03-16 ENCOUNTER — Telehealth: Payer: Self-pay | Admitting: Family Medicine

## 2018-03-16 LAB — LIPID PANEL
CHOL/HDL RATIO: 2.9 ratio (ref 0.0–4.4)
Cholesterol, Total: 190 mg/dL (ref 100–199)
HDL: 66 mg/dL (ref >39)
LDL CALC: 105 mg/dL — AB (ref 0–99)
TRIGLYCERIDES: 94 mg/dL (ref 0–149)
VLDL CHOLESTEROL CAL: 19 mg/dL (ref 5–40)

## 2018-03-16 LAB — COMPREHENSIVE METABOLIC PANEL
ALT: 21 IU/L (ref 0–32)
AST: 20 IU/L (ref 0–40)
Albumin/Globulin Ratio: 1.4 (ref 1.2–2.2)
Albumin: 4.1 g/dL (ref 3.5–5.5)
Alkaline Phosphatase: 95 IU/L (ref 39–117)
BUN/Creatinine Ratio: 10 (ref 9–23)
BUN: 7 mg/dL (ref 6–24)
Bilirubin Total: 0.4 mg/dL (ref 0.0–1.2)
CALCIUM: 9.1 mg/dL (ref 8.7–10.2)
CO2: 23 mmol/L (ref 20–29)
CREATININE: 0.73 mg/dL (ref 0.57–1.00)
Chloride: 97 mmol/L (ref 96–106)
GFR, EST AFRICAN AMERICAN: 116 mL/min/{1.73_m2} (ref >59)
GFR, EST NON AFRICAN AMERICAN: 100 mL/min/{1.73_m2} (ref >59)
Globulin, Total: 2.9 g/dL (ref 1.5–4.5)
Glucose: 126 mg/dL — ABNORMAL HIGH (ref 65–99)
Potassium: 4.5 mmol/L (ref 3.5–5.2)
Sodium: 135 mmol/L (ref 134–144)
TOTAL PROTEIN: 7 g/dL (ref 6.0–8.5)

## 2018-03-16 LAB — HEMOGLOBIN A1C
Est. average glucose Bld gHb Est-mCnc: 166 mg/dL
Hgb A1c MFr Bld: 7.4 % — ABNORMAL HIGH (ref 4.8–5.6)

## 2018-03-16 NOTE — Telephone Encounter (Signed)
LVM for pt to call the office and reschedule their appt. Due to provider schedule change, their provider will not be available. When pt calls back, please reschedule at their convenience.    Thank you!

## 2018-03-24 ENCOUNTER — Telehealth: Payer: Self-pay | Admitting: Family Medicine

## 2018-03-24 NOTE — Telephone Encounter (Signed)
Called and spoke with pt regarding their appt on 04/19/18. Due to provider schedule changes, we needed to get pt rescheduled. I was able to reschedule with Dr. Nolon Rod on 04/14/18 at 3:00. I advised of time, building and late policy. Pt acknowledged.   Pt states that she does get off work at 2:30 and will be coming from [facility]. She understands the late policy but we have had to cancel her appt(s) twice due to provider schedule changes. If we could please be considerate of that, it would be very much appreciated. Thank you!

## 2018-03-29 DIAGNOSIS — D069 Carcinoma in situ of cervix, unspecified: Secondary | ICD-10-CM | POA: Diagnosis not present

## 2018-03-29 DIAGNOSIS — Z3202 Encounter for pregnancy test, result negative: Secondary | ICD-10-CM | POA: Diagnosis not present

## 2018-04-13 NOTE — Progress Notes (Signed)
3.  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

## 2018-04-14 ENCOUNTER — Ambulatory Visit: Payer: 59 | Admitting: Family Medicine

## 2018-04-14 DIAGNOSIS — D069 Carcinoma in situ of cervix, unspecified: Secondary | ICD-10-CM | POA: Diagnosis not present

## 2018-04-23 ENCOUNTER — Ambulatory Visit: Payer: 59 | Admitting: Family Medicine

## 2018-04-26 ENCOUNTER — Ambulatory Visit: Payer: 59 | Admitting: Family Medicine

## 2018-08-11 DIAGNOSIS — Z30433 Encounter for removal and reinsertion of intrauterine contraceptive device: Secondary | ICD-10-CM | POA: Diagnosis not present

## 2018-08-11 DIAGNOSIS — Z3202 Encounter for pregnancy test, result negative: Secondary | ICD-10-CM | POA: Diagnosis not present

## 2019-01-18 NOTE — Progress Notes (Signed)
Please place surgery orders. Pt is scheduled for PAT appt on 01-19-19.

## 2019-01-18 NOTE — Patient Instructions (Addendum)
YOU ARE SCHEDULED FOR A COVID TEST 01-24-19 @ 9:30 AM. THIS TEST MUST BE DONE BEFORE SURGERY. GO TO  [address], [place], [address] AND REMAIN IN YOUR CAR, THIS IS A DRIVE UP TEST. ONCE YOUR COVID TEST IS DONE PLEASE FOLLOW ALL THE QUARANTINE  INSTRUCTIONS GIVEN IN YOUR HANDOUT.  1 VISITOR IS ALLOWED IN WAITING ROOM ONLY DAY OF SURGERY. NO VISITOR MAY SPEND THE NIGHT. VISITOR ARE ALLOWED TO STAY UNTIL 800 PM.    Your procedure is scheduled on 01-27-19    Report to [facility] AT  6:00 A M.   Call this number if you have problems the morning of surgery  :[phone].   OUR ADDRESS IS [place].  WE ARE LOCATED IN [facility].                                     REMEMBER:  DO NOT EAT FOOD OR DRINK LIQUIDS AFTER MIDNIGHT .    TAKE THESE MEDICATIONS MORNING OF SURGERY WITH A SIP OF WATER: None  IF YOU ARE SPENDING THE NIGHT AFTER SURGERY PLEASE BRING ALL YOUR PRESCRIPTION MEDICATIONS IN THEIR ORIGINAL BOTTLES.                                      DO NOT WEAR JEWERLY, MAKE UP, OR NAIL POLISH,   DO NOT WEAR LOTIONS, POWDERS, PERFUMES OR DEODORANT.  DO NOT SHAVE FOR 24 HOURS PRIOR TO DAY OF SURGERY.  CONTACTS, GLASSES, OR DENTURES MAY NOT BE WORN TO SURGERY.                                    [facility] IS NOT RESPONSIBLE  FOR ANY BELONGINGS.                                                                    Marland Kitchen                                                                                                    [facility] - Preparing for Surgery Before surgery, you can play an important role.  Because skin is not sterile, your skin needs to be as free of germs as possible.  You can reduce the number of germs on your skin by washing with CHG (chlorahexidine gluconate) soap before surgery.  CHG is an antiseptic cleaner which kills germs and bonds with the skin to continue killing germs even after washing. Please DO NOT use if you have an  allergy to CHG or antibacterial soaps.  If  your skin becomes reddened/irritated stop using the CHG and inform your nurse when you arrive at Short Stay. Do not shave (including legs and underarms) for at least 48 hours prior to the first CHG shower.  You may shave your face/neck. Please follow these instructions carefully:  1.  Shower with CHG Soap the night before surgery and the  morning of Surgery.  2.  If you choose to wash your hair, wash your hair first as usual with your  normal  shampoo.  3.  After you shampoo, rinse your hair and body thoroughly to remove the  shampoo.                           4.  Use CHG as you would any other liquid soap.  You can apply chg directly  to the skin and wash                       Gently with a scrungie or clean washcloth.  5.  Apply the CHG Soap to your body ONLY FROM THE NECK DOWN.   Do not use on face/ open                           Wound or open sores. Avoid contact with eyes, ears mouth and genitals (private parts).                       Wash face,  Genitals (private parts) with your normal soap.             6.  Wash thoroughly, paying special attention to the area where your surgery  will be performed.  7.  Thoroughly rinse your body with warm water from the neck down.  8.  DO NOT shower/wash with your normal soap after using and rinsing off  the CHG Soap.                9.  Pat yourself dry with a clean towel.            10.  Wear clean pajamas.            11.  Place clean sheets on your bed the night of your first shower and do not  sleep with pets. Day of Surgery : Do not apply any lotions/deodorants the morning of surgery.  Please wear clean clothes to the hospital/surgery center.  FAILURE TO FOLLOW THESE INSTRUCTIONS MAY RESULT IN THE CANCELLATION OF YOUR SURGERY PATIENT SIGNATURE_________________________________  NURSE SIGNATURE__________________________________  ________________________________________________________________________

## 2019-01-19 ENCOUNTER — Encounter (HOSPITAL_COMMUNITY): Payer: Self-pay | Admitting: Physician Assistant

## 2019-01-19 ENCOUNTER — Encounter (HOSPITAL_COMMUNITY): Payer: Self-pay

## 2019-01-19 ENCOUNTER — Other Ambulatory Visit: Payer: Self-pay

## 2019-01-19 ENCOUNTER — Encounter (HOSPITAL_COMMUNITY)
Admission: RE | Admit: 2019-01-19 | Discharge: 2019-01-19 | Disposition: A | Discharge status: Home / Self Care | Payer: 59 | Source: Ambulatory Visit | Attending: Obstetrics and Gynecology | Admitting: Obstetrics and Gynecology

## 2019-01-19 DIAGNOSIS — E119 Type 2 diabetes mellitus without complications: Secondary | ICD-10-CM | POA: Insufficient documentation

## 2019-01-19 DIAGNOSIS — I10 Essential (primary) hypertension: Secondary | ICD-10-CM | POA: Insufficient documentation

## 2019-01-19 DIAGNOSIS — Z01812 Encounter for preprocedural laboratory examination: Secondary | ICD-10-CM | POA: Diagnosis not present

## 2019-01-19 DIAGNOSIS — I498 Other specified cardiac arrhythmias: Secondary | ICD-10-CM | POA: Diagnosis not present

## 2019-01-19 LAB — CBC
HCT: 44.9 % (ref 36.0–46.0)
Hemoglobin: 15 g/dL (ref 12.0–15.0)
MCH: 29.4 pg (ref 26.0–34.0)
MCHC: 33.4 g/dL (ref 30.0–36.0)
MCV: 87.9 fL (ref 80.0–100.0)
Platelets: 292 10*3/uL (ref 150–400)
RBC: 5.11 MIL/uL (ref 3.87–5.11)
RDW: 12.2 % (ref 11.5–15.5)
WBC: 6.6 10*3/uL (ref 4.0–10.5)
nRBC: 0 % (ref 0.0–0.2)

## 2019-01-19 LAB — BASIC METABOLIC PANEL
Anion gap: 10 (ref 5–15)
BUN: 12 mg/dL (ref 6–20)
CO2: 26 mmol/L (ref 22–32)
Calcium: 9.1 mg/dL (ref 8.9–10.3)
Chloride: 98 mmol/L (ref 98–111)
Creatinine, Ser: 0.79 mg/dL (ref 0.44–1.00)
GFR calc Af Amer: >60 mL/min (ref >60)
GFR calc non Af Amer: >60 mL/min (ref >60)
Glucose, Bld: 211 mg/dL — ABNORMAL HIGH (ref 70–99)
Potassium: 3.7 mmol/L (ref 3.5–5.1)
Sodium: 134 mmol/L — ABNORMAL LOW (ref 135–145)

## 2019-01-19 LAB — GLUCOSE, CAPILLARY: Glucose-Capillary: 210 mg/dL — ABNORMAL HIGH (ref 70–99)

## 2019-01-20 ENCOUNTER — Ambulatory Visit (INDEPENDENT_AMBULATORY_CARE_PROVIDER_SITE_OTHER): Payer: 59 | Admitting: Adult Health Nurse Practitioner

## 2019-01-20 ENCOUNTER — Other Ambulatory Visit: Payer: Self-pay | Admitting: Adult Health Nurse Practitioner

## 2019-01-20 ENCOUNTER — Encounter: Payer: Self-pay | Admitting: Adult Health Nurse Practitioner

## 2019-01-20 VITALS — BP 150/92 | HR 105 | Temp 98.0°F | Resp 18 | Ht 68.5 in | Wt 231.2 lb

## 2019-01-20 DIAGNOSIS — E1159 Type 2 diabetes mellitus with other circulatory complications: Secondary | ICD-10-CM

## 2019-01-20 DIAGNOSIS — E119 Type 2 diabetes mellitus without complications: Secondary | ICD-10-CM | POA: Diagnosis not present

## 2019-01-20 DIAGNOSIS — E6609 Other obesity due to excess calories: Secondary | ICD-10-CM

## 2019-01-20 DIAGNOSIS — I10 Essential (primary) hypertension: Secondary | ICD-10-CM | POA: Diagnosis not present

## 2019-01-20 DIAGNOSIS — Z6832 Body mass index (BMI) 32.0-32.9, adult: Secondary | ICD-10-CM

## 2019-01-20 DIAGNOSIS — E041 Nontoxic single thyroid nodule: Secondary | ICD-10-CM

## 2019-01-20 LAB — HEMOGLOBIN A1C
Hgb A1c MFr Bld: 11 % — ABNORMAL HIGH (ref 4.8–5.6)
Mean Plasma Glucose: 269 mg/dL

## 2019-01-20 MED ORDER — GLIPIZIDE 5 MG PO TABS: 5.0000 mg | ORAL_TABLET | Freq: Two times a day (BID) | ORAL | 3 refills | Status: DC

## 2019-01-20 MED ORDER — METFORMIN HCL 500 MG PO TABS: 500.0000 mg | ORAL_TABLET | Freq: Two times a day (BID) | ORAL | 0 refills | Status: DC

## 2019-01-20 MED ORDER — LISINOPRIL-HYDROCHLOROTHIAZIDE 20-25 MG PO TABS: 1.0000 | ORAL_TABLET | Freq: Every day | ORAL | 3 refills | Status: DC

## 2019-01-20 NOTE — Progress Notes (Signed)
Chief Complaint  Patient presents with  . Establish Care    and surgical clearance, having hysterectomy 10/01    HPI   Patient presents for surgical clearance for a hysterectomy.  Scheduled for next Thursday.  Has not been seen in this office since early 2019.  Recently had blood work completed which shows a HgbA1C > 11%.  This is up from 7.4% previously.  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  She has a lot on her plate but states she would like to make her health a priority.  We discussed that with a HgbA1C of 11%, she cannot be given surgical clearance.   Diabetes Mellitus: Patient presents for follow up of diabetes. Symptoms: no symptoms per patient. Symptoms have been basically asymptomatic. Patient denies hyperglycemia, paresthesia of the feet, polydipsia, polyuria and visual disturbances.  Evaluation to date has been included: hemoglobin A1C.  Home sugars: patient does not check sugars. Treatment to date: more intensive attention to diet which has been too early to assess effectiveness and increased aerobic exercise which has been too early to assess effectiveness.  Lab Results  Component Value Date   HGBA1C 11.0 (H) 01/19/2019    Hypertension: Patient here for follow-up of elevated blood pressure. She is exercising and is adherent to low salt diet.  Blood pressure unknown if well controlled at home. Cardiac symptoms none. Patient denies none.  Cardiovascular risk factors: diabetes mellitus, hypertension and obesity (BMI >= 30 kg/m2). Use of agents associated with hypertension: none. History of target organ damage: none.   Obesity: Yvonne Fisher is a 43 y.o. female who presents for continued treatment for obesity. She has noted a weight gain of approximately 3  pounds over the last 1 year.  Previous treatments for obesity include: none. Associated medical conditions: hypertension. Associated medications: none. Cardiovascular risk factors besides obesity: diabetes mellitus, hypertension and obesity (BMI >= 30 kg/m2). Wt Readings from Last 3 Encounters:  01/20/19 231 lb 3.2 oz (104.9 kg)  01/19/19 231 lb (104.8 kg)  03/15/18 228 lb 9.6 oz (103.7 kg)    Thyroid Nodule:  Hx of thyroid nodule in the past.  No recent TSH. Will recheck today.  Denies changes in temperature.  No increase in appetite.  No excessive weight gain or weight loss. Denies change in bowel or bladder function.      Past Medical History:  Diagnosis Date  . Arthritis    right elbow   . Diabetes ([place])   . GERD (gastroesophageal reflux disease)   . Gestational diabetes    diet controlled  . Hidradenitis 11/2011   left axilla  . Pregnancy induced hypertension 2015   "still keeping eye on it" (01/18/2014)  . Rash 12/01/2011   bilat. axilla  . Thyroid nodule     Current Outpatient Medications  Medication Sig Dispense Refill  . Lancets MISC Check sugar once daily dx DMII 100 each 5  . glipiZIDE (GLUCOTROL) 5 MG tablet Take 1 tablet (5 mg total) by mouth 2 (two) times daily before a meal. 60 tablet 3  . lisinopril-hydrochlorothiazide (ZESTORETIC) 20-25 MG tablet Take 1 tablet by mouth daily. 90 tablet 3  . metFORMIN (GLUCOPHAGE) 500 MG tablet Take 1 tablet (500 mg total) by mouth 2 (two) times daily with a meal. 60  tablet 0   No current facility-administered medications for this visit.     Allergies:  Allergies  Allergen Reactions  . Adhesive [Tape] Other (See Comments)    TEARS SKIN  . Prevacid [Lansoprazole] Hives    Past Surgical History:  Procedure Laterality Date  . CHOLECYSTECTOMY  04/15/2011   Procedure: LAPAROSCOPIC CHOLECYSTECTOMY WITH INTRAOPERATIVE CHOLANGIOGRAM;  Surgeon: Yvonne Hector, MD;  Location: [facility];  Service: General;  Laterality: N/A;  laparoscopic cholecystectomy with  cholangiogram  . ELBOW ARTHROSCOPY  1997   right elbow  . HYDRADENITIS EXCISION  12/05/2011   Procedure: EXCISION HYDRADENITIS AXILLA;  Surgeon: Yvonne Bowie, MD;  Location: [facility];  Service: General;  Laterality: Left;  wide excision hidradenitis left axilla  . THYROID LOBECTOMY Left 01/18/2014  . THYROIDECTOMY Left 01/18/2014   Procedure: LEFT THYROID LOBECTOMY;  Surgeon: Yvonne Keens, MD;  Location: [facility];  Service: General;  Laterality: Left;    Social History   Socioeconomic History  . Marital status: Single    Spouse name: Not on file  . Number of children: 2  . Years of education: Not on file  . Highest education level: Not on file  Occupational History    Comment: order processor  Social Needs  . Financial resource strain: Not on file  . Food insecurity    Worry: Not on file    Inability: Not on file  . Transportation needs    Medical: Not on file    Non-medical: Not on file  Tobacco Use  . Smoking status: Never Smoker  . Smokeless tobacco: Never Used  Substance and Sexual Activity  . Alcohol use: Yes    Comment: 01/18/2014 "might have a drink @ birthday party or holidays; not all the time"  . Drug use: No  . Sexual activity: Never    Birth control/protection: None  Lifestyle  . Physical activity    Days per week: Not on file    Minutes per session: Not on file  . Stress: Not on file  Relationships  . Social Herbalist on phone: Not on file    Gets together: Not on file    Attends religious service: Not on file    Active member of club or organization: Not on file    Attends meetings of clubs or organizations: Not on file    Relationship status: Not on file  Other Topics Concern  . Not on file  Social History Narrative   Marital status: single; dating seriously x 10 years; happy; no abuse.      Children:  (33 yo son, 57 week old boy).      Lives: with 2 sons, boyfriend.      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 Hypertension Mother   . Multiple sclerosis Mother   . Diabetes Father   . Heart disease Father 3       AMI     ROS Review of Systems See HPI Constitution: No fevers or chills No malaise No diaphoresis Skin: No rash or itching Eyes: no blurry vision, no double vision GU: no dysuria or hematuria Neuro: no dizziness or headaches * all others reviewed and negative   Objective: Vitals:   01/20/19 1537  BP: Marland Kitchen)  150/92  Pulse: (!) 105  Resp: 18  Temp: 98 F (36.7 C)  TempSrc: Oral  SpO2: 96%  Weight: 231 lb 3.2 oz (104.9 kg)  Height: 5' 8.5" (1.74 m)    Physical Exam Constitutional:      Appearance: Normal appearance. She is obese.  Neck:     Musculoskeletal: Normal range of motion and neck supple.     Vascular: No carotid bruit.  Cardiovascular:     Rate and Rhythm: Normal rate and regular rhythm.     Pulses: Normal pulses.     Heart sounds: Normal heart sounds. No murmur.  Pulmonary:     Effort: Pulmonary effort is normal.     Breath sounds: Normal breath sounds.  Skin:    General: Skin is warm and dry.     Capillary Refill: Capillary refill takes less than 2 seconds.     Findings: No erythema or rash.  Neurological:     General: No focal deficit present.     Mental Status: She is alert and oriented to person, place, and time.  Psychiatric:        Mood and Affect: Mood normal.        Behavior: Behavior normal.        Thought Content: Thought content normal.        Judgment: Judgment normal.     Assessment and Plan Yvonne Fisher was seen today for establish care.  Diagnoses and all orders for this visit:  Type 2 diabetes mellitus without complication, without long-term current use of insulin (HCC) -     metFORMIN (GLUCOPHAGE) 500 MG tablet; Take 1 tablet (500 mg total) by mouth 2 (two) times daily with  a meal. -     glipiZIDE (GLUCOTROL) 5 MG tablet; Take 1 tablet (5 mg total) by mouth 2 (two) times daily before a meal. -     lisinopril-hydrochlorothiazide (ZESTORETIC) 20-25 MG tablet; Take 1 tablet by mouth daily. -     Lipid panel -     Thyroid Panel With TSH  Essential hypertension -     metFORMIN (GLUCOPHAGE) 500 MG tablet; Take 1 tablet (500 mg total) by mouth 2 (two) times daily with a meal. -     glipiZIDE (GLUCOTROL) 5 MG tablet; Take 1 tablet (5 mg total) by mouth 2 (two) times daily before a meal. -     lisinopril-hydrochlorothiazide (ZESTORETIC) 20-25 MG tablet; Take 1 tablet by mouth daily. -     Lipid panel -     Thyroid Panel With TSH  Class 1 obesity due to excess calories with serious comorbidity and body mass index (BMI) of 32.0 to 32.9 in adult  Thyroid Nodule:  Patient has a past hx of this.  Will check TSH and thyroid panel.   Problem List Items Addressed This Visit      Cardiovascular and Mediastinum   Essential hypertension   Relevant Medications   metFORMIN (GLUCOPHAGE) 500 MG tablet   glipiZIDE (GLUCOTROL) 5 MG tablet   lisinopril-hydrochlorothiazide (ZESTORETIC) 20-25 MG tablet   Other Relevant Orders   Lipid panel   Thyroid Panel With TSH     Endocrine   Type 2 diabetes mellitus without complication, without long-term current use of insulin (HCC) - Primary (Chronic)   Relevant Medications   metFORMIN (GLUCOPHAGE) 500 MG tablet   glipiZIDE (GLUCOTROL) 5 MG tablet   lisinopril-hydrochlorothiazide (ZESTORETIC) 20-25 MG tablet   Other Relevant Orders   Lipid panel   Thyroid Panel With TSH  Other   Obesity (Chronic)   Relevant Medications   metFORMIN (GLUCOPHAGE) 500 MG tablet   glipiZIDE (GLUCOTROL) 5 MG tablet      General weight loss/lifestyle modification strategies discussed (elicit support from others; identify saboteurs; non-food rewards, etc). Diet interventions: qualitative changes (increase low-fat,  high-fiber foods) and referral to  dietitian for guidance in these changes. Informal exercise measures discussed, e.g. taking stairs instead of elevator. Regular aerobic exercise program discussed.  Medication changes per orders. Regular aerobic exercise. Weight loss.  Discussed general issues about diabetes pathophysiology and management. Counseling at today's visit: focused on the need for regular aerobic exercise, discussed the advantages of a diet low in carbohydrates, reminded to check sugars regularly and to bring readings in at the time of the next visit and discussed management of hypoglycemic episodes. Neurosurgeon distributed. Encouraged aerobic exercise. Started sulfonylurea; see medication orders.  Restarted metformin; see  medication orders. Increased dose of ACE inhibitor; see medication orders. Labs: fasting lipid panel and microalbuminuria. Diabetes educator referral. Reminded to bring in blood sugar diary at next visit. Follow up in 2 weeks or as needed.    Glyn Ade  A total of  40 minutes were spent face-to-face with the patient during this encounter and over half of that time was spent on counseling and coordination of care.

## 2019-01-21 ENCOUNTER — Other Ambulatory Visit: Payer: Self-pay

## 2019-01-21 ENCOUNTER — Ambulatory Visit (INDEPENDENT_AMBULATORY_CARE_PROVIDER_SITE_OTHER): Payer: 59 | Admitting: Family Medicine

## 2019-01-21 DIAGNOSIS — E119 Type 2 diabetes mellitus without complications: Secondary | ICD-10-CM

## 2019-01-21 DIAGNOSIS — E041 Nontoxic single thyroid nodule: Secondary | ICD-10-CM

## 2019-01-21 DIAGNOSIS — E1159 Type 2 diabetes mellitus with other circulatory complications: Secondary | ICD-10-CM

## 2019-01-21 MED ORDER — BLOOD GLUCOSE METER KIT: PACK | 0 refills | Status: DC

## 2019-01-22 LAB — LIPID PANEL
Chol/HDL Ratio: 3.6 ratio (ref 0.0–4.4)
Cholesterol, Total: 189 mg/dL (ref 100–199)
HDL: 53 mg/dL (ref >39)
LDL Chol Calc (NIH): 124 mg/dL — ABNORMAL HIGH (ref 0–99)
Triglycerides: 65 mg/dL (ref 0–149)
VLDL Cholesterol Cal: 12 mg/dL (ref 5–40)

## 2019-01-22 LAB — THYROID PANEL WITH TSH
Free Thyroxine Index: 1.9 (ref 1.2–4.9)
T3 Uptake Ratio: 26 % (ref 24–39)
T4, Total: 7.2 ug/dL (ref 4.5–12.0)
TSH: 0.835 u[IU]/mL (ref 0.450–4.500)

## 2019-01-24 ENCOUNTER — Telehealth: Payer: Self-pay | Admitting: Adult Health Nurse Practitioner

## 2019-01-24 ENCOUNTER — Other Ambulatory Visit (HOSPITAL_COMMUNITY): Payer: 59

## 2019-01-24 ENCOUNTER — Other Ambulatory Visit (HOSPITAL_COMMUNITY)
Admission: RE | Admit: 2019-01-24 | Discharge: 2019-01-24 | Disposition: A | Discharge status: Home / Self Care | Payer: 59 | Source: Ambulatory Visit | Attending: Obstetrics and Gynecology | Admitting: Obstetrics and Gynecology

## 2019-01-24 DIAGNOSIS — Z20828 Contact with and (suspected) exposure to other viral communicable diseases: Secondary | ICD-10-CM | POA: Insufficient documentation

## 2019-01-24 DIAGNOSIS — Z01812 Encounter for preprocedural laboratory examination: Secondary | ICD-10-CM | POA: Diagnosis present

## 2019-01-24 NOTE — Telephone Encounter (Signed)
Copied from [place] [id number]. Topic: General - Call Back - No Documentation >> Jan 24, 2019 12:29 PM Erick Blinks wrote: Yvonne Fisher [phone]  Jessica from [facility], called to make sure medical clearance forms were received via fax. Please advise

## 2019-01-25 LAB — NOVEL CORONAVIRUS, NAA (HOSP ORDER, SEND-OUT TO REF LAB; TAT 18-24 HRS): SARS-CoV-2, NAA: NOT DETECTED

## 2019-01-25 NOTE — Progress Notes (Signed)
Patient called.  Patient aware.  

## 2019-01-25 NOTE — Telephone Encounter (Signed)
The surgical clearance for this pt is in Dr. Polly Cobia urgent red box in doctor's lounge. Jessica from [facility] is callings checking on status of this message. Fax back when complete

## 2019-01-26 ENCOUNTER — Telehealth: Payer: Self-pay | Admitting: Adult Health Nurse Practitioner

## 2019-01-26 NOTE — Telephone Encounter (Signed)
Pt is wanting to stop by and pick up diabetic  kit  Pharmacy did not have an order /..please contact pt when ready to pick up at front desk at our office. Pt doesn't want to pay for it .

## 2019-01-27 ENCOUNTER — Other Ambulatory Visit: Payer: Self-pay | Admitting: Adult Health Nurse Practitioner

## 2019-01-27 ENCOUNTER — Encounter (HOSPITAL_BASED_OUTPATIENT_CLINIC_OR_DEPARTMENT_OTHER): Admission: RE | Payer: Self-pay | Source: Home / Self Care

## 2019-01-27 ENCOUNTER — Ambulatory Visit (HOSPITAL_BASED_OUTPATIENT_CLINIC_OR_DEPARTMENT_OTHER): Admission: RE | Admit: 2019-01-27 | Payer: 59 | Source: Home / Self Care | Admitting: Obstetrics and Gynecology

## 2019-01-27 SURGERY — HYSTERECTOMY, VAGINAL, LAPAROSCOPY-ASSISTED, WITH SALPINGECTOMY
Anesthesia: General

## 2019-01-27 MED ORDER — LANCETS MISC: 5 refills | Status: DC

## 2019-01-27 MED ORDER — BLOOD GLUCOSE METER KIT: PACK | 0 refills | Status: AC

## 2019-02-23 ENCOUNTER — Other Ambulatory Visit: Payer: Self-pay | Admitting: Adult Health Nurse Practitioner

## 2019-02-23 DIAGNOSIS — I10 Essential (primary) hypertension: Secondary | ICD-10-CM

## 2019-02-23 DIAGNOSIS — E119 Type 2 diabetes mellitus without complications: Secondary | ICD-10-CM

## 2019-02-23 MED ORDER — METFORMIN HCL 500 MG PO TABS: 500.0000 mg | ORAL_TABLET | Freq: Two times a day (BID) | ORAL | 0 refills | Status: DC

## 2019-02-23 NOTE — Telephone Encounter (Signed)
Copied from [place] [id number]. Topic: Quick Communication - Rx Refill/Question >> Feb 23, 2019 10:31 AM Rainey Pines A wrote: Medication: metFORMIN (GLUCOPHAGE) 500 MG tablet (Patient stated that pharmacy has been reaching out to get medication sent over. Patient is completely out.)  Has the patient contacted their pharmacy? {Yes (Agent: If no, request that the patient contact the pharmacy for the refill.) (Agent: If yes, when and what did the pharmacy advise?)Contact PCP  Preferred Pharmacy (with phone number or street name): [facility] [phone] - [place], [address] AT [address] [phone] (Phone) [phone] (Fax)    Agent: Please be advised that RX refills may take up to 3 business days. We ask that you follow-up with your pharmacy.

## 2019-02-25 ENCOUNTER — Encounter: Payer: 59 | Attending: Adult Health Nurse Practitioner | Admitting: Registered"

## 2019-02-25 ENCOUNTER — Other Ambulatory Visit: Payer: Self-pay

## 2019-02-25 ENCOUNTER — Encounter: Payer: Self-pay | Admitting: Registered"

## 2019-02-25 DIAGNOSIS — E119 Type 2 diabetes mellitus without complications: Secondary | ICD-10-CM | POA: Diagnosis not present

## 2019-02-25 NOTE — Patient Instructions (Addendum)
Goals:  Follow Diabetes Meal Plan as instructed  Eat 3 meals and 2 snacks, every 3-5 hrs  Add lean protein foods to meals/snacks  Monitor glucose levels as instructed by your doctor  Aim for 30 mins of physical activity daily  Bring food record and glucose log to your next nutrition visit  Make 1/2 plate as non-starchy vegetables

## 2019-02-25 NOTE — Progress Notes (Signed)
Diabetes Self-Management Education  Visit Type:  First/Initial  Appt. Start Time: 8:00 Appt. End Time: 9:20  02/25/2019  [name], identified by name and date of birth, is a 43 y.o. female with a diagnosis of Diabetes: Type 2.   ASSESSMENT  Pt expectations: wants to know what to eat. Ultimately wants to stop taking diabetes medications  Pt states she checks BS 1-2x/day: FBS (122-170) and after meals (115-201).   Works in [employer] M-F 5/6:25am-2:35/5pm. Sometimes works between 8-9 hrs. Schedule fluctuates but states she manages well. Does not increase stress for her.   States she used to drink alcohol and sodas more often. Since A1c has increased states she is no longer eating cereal and does not consume bread often. States she really wants to decrease A1c to have surgery.  There were no vitals taken for this visit. There is no height or weight on file to calculate BMI.   Diabetes Self-Management Education - 02/25/19 0806      Health Coping   How would you rate your overall health?  Fair      Psychosocial Assessment   Patient Belief/Attitude about Diabetes  Motivated to manage diabetes    Self-care barriers  None    Self-management support  Friends    Patient Concerns  Medication;Nutrition/Meal planning    Special Needs  None    Preferred Learning Style  No preference indicated    Learning Readiness  Ready      Complications   Last HgB A1C per patient/outside source  11 %    How often do you check your blood sugar?  1-2 times/day    Fasting Blood glucose range (mg/dL)  70-129;130-179    Postprandial Blood glucose range (mg/dL)  70-129;130-179;180-200    Number of hypoglycemic episodes per month  0    Number of hyperglycemic episodes per week  0    Have you had a dilated eye exam in the past 12 months?  Yes    Have you had a dental exam in the past 12 months?  No    Are you checking your feet?  Yes    How many days per week are you checking your feet?  4      Dietary Intake   Breakfast  [employer]-bacon egg english muffin or individual bag apple slices + water + 2.5 slices chicken bacon    Lunch  green beans + chicken noodles    Dinner  [employer]-2 beef tacos + nachos and cheese + serving size cinnamon twists + water    Snack (evening)  1 individual bag apple slices    Beverage(s)  vanilla caramel latte (sometimes), water (48+ oz), coffee (sometimes), green tea      Exercise   Exercise Type  ADL's    How many days per week to you exercise?  0    How many minutes per day do you exercise?  0    Total minutes per week of exercise  0      Patient Education   Previous Diabetes Education  No    Disease state   Definition of diabetes, type 1 and 2, and the diagnosis of diabetes;Factors that contribute to the development of diabetes    Nutrition management   Role of diet in the treatment of diabetes and the relationship between the three main macronutrients and blood glucose level;Reviewed blood glucose goals for pre and post meals and how to evaluate the patients' food intake on their blood glucose level.;Effects  of alcohol on blood glucose and safety factors with consumption of alcohol.;Information on hints to eating out and maintain blood glucose control.;Meal options for control of blood glucose level and chronic complications.    Physical activity and exercise   Role of exercise on diabetes management, blood pressure control and cardiac health.    Monitoring  Purpose and frequency of SMBG.;Taught/discussed recording of test results and interpretation of SMBG.;Interpreting lab values - A1C, lipid, urine microalbumina.;Identified appropriate SMBG and/or A1C goals.    Acute complications  Taught treatment of hypoglycemia - the 15 rule.;Discussed and identified patients' treatment of hyperglycemia.    Chronic complications  Relationship between chronic complications and blood glucose control;Lipid levels, blood glucose control and heart disease;Reviewed with  patient heart disease, higher risk of, and prevention    Psychosocial adjustment  Identified and addressed patients feelings and concerns about diabetes;Helped patient identify a support system for diabetes management      Individualized Goals (developed by patient)   Nutrition  General guidelines for healthy choices and portions discussed;Follow meal plan discussed    Medications  take my medication as prescribed    Monitoring   test my blood glucose as discussed    Reducing Risk  examine blood glucose patterns;treat hypoglycemia with 15 grams of carbs if blood glucose less than 70mg /dL;increase portions of nuts and seeds      Post-Education Assessment   Patient understands the diabetes disease and treatment process.  Demonstrates understanding / competency    Patient understands incorporating nutritional management into lifestyle.  Demonstrates understanding / competency    Patient undertands incorporating physical activity into lifestyle.  Needs Review    Patient understands using medications safely.  Demonstrates understanding / competency    Patient understands monitoring blood glucose, interpreting and using results  Demonstrates understanding / competency    Patient understands prevention, detection, and treatment of acute complications.  Demonstrates understanding / competency    Patient understands prevention, detection, and treatment of chronic complications.  Needs Review    Patient understands how to develop strategies to address psychosocial issues.  Needs Review    Patient understands how to develop strategies to promote health/change behavior.  Needs Review      Outcomes   Program Status  Not Completed       Learning Objective:  Patient will have a greater understanding of diabetes self-management. Patient education plan is to attend individual and/or group sessions per assessed needs and concerns.   Plan:   Patient Instructions  Goals:  Follow Diabetes Meal Plan as  instructed  Eat 3 meals and 2 snacks, every 3-5 hrs  Add lean protein foods to meals/snacks  Monitor glucose levels as instructed by your doctor  Aim for 30 mins of physical activity daily  Bring food record and glucose log to your next nutrition visit  Make 1/2 plate as non-starchy vegetables     Expected Outcomes:  Demonstrated interest in learning. Expect positive outcomes  Education material provided: ADA - How to Thrive: A Guide for Your Journey with Diabetes  If problems or questions, patient to contact team via:  Phone and Email  Future DSME appointment: - Other (comment)(3 weeks)

## 2019-03-09 ENCOUNTER — Telehealth: Payer: Self-pay | Admitting: Adult Health Nurse Practitioner

## 2019-03-09 NOTE — Telephone Encounter (Signed)
Pt called and said that she spoke with her pharmacy and they told her so has no refills left for her lancets, test strips, needles. I see the test strips says 5 refills but the pharmacy says that they do not have the prescription

## 2019-03-15 ENCOUNTER — Telehealth: Payer: Self-pay | Admitting: Adult Health Nurse Practitioner

## 2019-03-15 NOTE — Telephone Encounter (Signed)
Pt called saying she still has not recieved the her lancets and test strips.  [place]

## 2019-03-17 ENCOUNTER — Encounter: Payer: Self-pay | Admitting: Registered"

## 2019-03-17 ENCOUNTER — Other Ambulatory Visit: Payer: Self-pay

## 2019-03-17 ENCOUNTER — Encounter: Payer: 59 | Attending: Adult Health Nurse Practitioner | Admitting: Registered"

## 2019-03-17 DIAGNOSIS — E119 Type 2 diabetes mellitus without complications: Secondary | ICD-10-CM | POA: Insufficient documentation

## 2019-03-17 MED ORDER — ACCU-CHEK GUIDE VI STRP: ORAL_STRIP | 5 refills | Status: AC

## 2019-03-17 MED ORDER — LANCETS MISC: 5 refills | Status: AC

## 2019-03-17 NOTE — Telephone Encounter (Signed)
Resent Rx 

## 2019-03-17 NOTE — Progress Notes (Signed)
Diabetes Self-Management Education  Visit Type:  Follow-up  Appt. Start Time: 3:25 Appt. End Time: 4:20  03/17/2019  Yvonne Fisher, identified by name and date of birth, is a 43 y.o. female with a diagnosis of Diabetes: Type 2.   ASSESSMENT  Pt arrives with documented BS numbers. Checking 1-2x/day. States she has been working more in a different department which is physical activity for her. States it requires a lot more walking, bending, pushing, and moving items.   There were no vitals taken for this visit. There is no height or weight on file to calculate BMI.   Diabetes Self-Management Education - 03/17/19 1534      Psychosocial Assessment   Patient Belief/Attitude about Diabetes  Motivated to manage diabetes    Self-care barriers  None      Complications   How often do you check your blood sugar?  1-2 times/day    Fasting Blood glucose range (mg/dL)  130-179    Postprandial Blood glucose range (mg/dL)  130-179;70-129    Number of hypoglycemic episodes per month  0    Number of hyperglycemic episodes per week  0    Have you had a dilated eye exam in the past 12 months?  Yes    Have you had a dental exam in the past 12 months?  No    Are you checking your feet?  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  0    How many minutes per day do you exercise?  0    Total minutes per week of exercise  0      Patient Education   Previous Diabetes Education  No    Disease state   Definition of diabetes, type 1 and 2, and the diagnosis of diabetes;Factors that contribute to the development of diabetes      Individualized Goals (developed by patient)   Physical Activity  Exercise 1-2 times per week;30 minutes per day    Health Coping  Not Applicable      Post-Education Assessment   Patient understands the diabetes disease and treatment process.  Demonstrates understanding / competency    Patient understands incorporating nutritional management into lifestyle.  Demonstrates understanding / competency    Patient undertands incorporating physical activity into lifestyle.  Demonstrates understanding / competency    Patient understands using medications safely.  Demonstrates understanding / competency    Patient understands monitoring blood glucose, interpreting and using results  Demonstrates understanding / competency    Patient understands prevention, detection, and treatment of acute complications.  Demonstrates understanding / competency    Patient understands prevention, detection, and treatment of chronic complications.  Demonstrates understanding / competency    Patient understands how to develop strategies to address psychosocial issues.  Demonstrates understanding / competency    Patient understands how to develop strategies to promote health/change behavior.  Demonstrates understanding / competency      Outcomes   Program Status  Completed      Subsequent Visit   Since your last visit have you continued or begun to take your medications as prescribed?  Yes    Since your last visit have you had your blood pressure checked?  No    Since your last visit have you experienced any weight changes?  No change    Since your last visit, are you checking your blood glucose at least once a day?  Yes       Learning Objective:  Patient will have a greater understanding of diabetes self-management. Patient education plan is to attend individual and/or group sessions per assessed needs and concerns.   Plan:   Patient Instructions  Goals:  Follow Diabetes Meal Plan as instructed  Eat 3 meals  and 2 snacks, every 3-5 hrs  Limit carbohydrate intake to 30-45 grams carbohydrate/meal  Limit carbohydrate intake to 15-30 grams carbohydrate/snack  Add lean protein foods to meals/snacks  Monitor glucose levels as instructed by your doctor  Aim for 30 mins of physical activity daily  Bring food record and glucose log to your next nutrition visit     Expected Outcomes:  Demonstrated interest in learning. Expect positive outcomes  Education material provided: ADA - How to Thrive: A Guide for Your Journey with Diabetes, Meal plan card and Carbohydrate counting sheet  If problems or questions, patient to contact team via:  Phone and Email  Future DSME appointment: - Yearly

## 2019-03-17 NOTE — Patient Instructions (Signed)
Goals:  Follow Diabetes Meal Plan as instructed  Eat 3 meals and 2 snacks, every 3-5 hrs  Limit carbohydrate intake to 30-45 grams carbohydrate/meal  Limit carbohydrate intake to 15-30 grams carbohydrate/snack  Add lean protein foods to meals/snacks  Monitor glucose levels as instructed by your doctor  Aim for 30 mins of physical activity daily  Bring food record and glucose log to your next nutrition visit  

## 2019-03-23 NOTE — Patient Instructions (Addendum)
ONCE YOUR COVID TEST IS DONE PLEASE FOLLOW ALL THE QUARANTINE  INSTRUCTIONS GIVEN IN YOUR HANDOUT.      Your procedure is scheduled on  Thursday 03/31/2019   Report to [facility]. M.   Call this number if you have problems the morning of surgery  :[phone].   OUR ADDRESS IS [place].  WE ARE LOCATED IN [facility].      How to Manage Your Diabetes Before and After Surgery  Why is it important to control my blood sugar before and after surgery? . Improving blood sugar levels before and after surgery helps healing and can limit problems. . A way of improving blood sugar control is eating a healthy diet by: o  Eating less sugar and carbohydrates o  Increasing activity/exercise o  Talking with your doctor about reaching your blood sugar goals . High blood sugars (greater than 180 mg/dL) can raise your risk of infections and slow your recovery, so you will need to focus on controlling your diabetes during the weeks before surgery. . Make sure that the doctor who takes care of your diabetes knows about your planned surgery including the date and location.  How do I manage my blood sugar before surgery? . Check your blood sugar at least 4 times a day, starting 2 days before surgery, to make sure that the level is not too high or low. o Check your blood sugar the morning of your surgery when you wake up and every 2 hours until you get to the Short Stay unit. . If your blood sugar is less than 70 mg/dL, you will need to treat for low blood sugar: o Do not take insulin. o Treat a low blood sugar (less than 70 mg/dL) with  cup of clear juice (cranberry or apple), 4 glucose tablets, OR glucose gel. o Recheck blood sugar in 15 minutes after treatment (to make sure it is greater than 70 mg/dL). If your blood sugar is not greater than 70 mg/dL on recheck, call [phone] for further instructions. . Report your blood sugar to the  short stay nurse when you get to Short Stay.  . If you are admitted to the hospital after surgery: o Your blood sugar will be checked by the staff and you will probably be given insulin after surgery (instead of oral diabetes medicines) to make sure you have good blood sugar levels. o The goal for blood sugar control after surgery is 80-180 mg/dL.   WHAT DO I DO ABOUT MY DIABETES MEDICATION?       The day before surgery, Take morning dose ONLY of Glipizide (Glucotrol)!         The day before surgery, Take Metformin (Glucophage ) as prescribed!  . Do not take oral diabetes medicines (pills) the morning of surgery.                                    REMEMBER:  DO NOT EAT FOOD OR DRINK LIQUIDS AFTER MIDNIGHT .    YOU MAY  BRUSH YOUR TEETH MORNING OF SURGERY AND RINSE YOUR MOUTH OUT, NO CHEWING GUM CANDY OR MINTS.    TAKE THESE MEDICATIONS MORNING OF SURGERY WITH A SIP OF WATER:  Amoxicillin   IF YOU ARE SPENDING THE NIGHT AFTER SURGERY PLEASE BRING ALL YOUR PRESCRIPTION MEDICATIONS IN THEIR ORIGINAL  BOTTLES. 1 VISITOR IS ALLOWED IN WAITING ROOM ONLY DAY OF SURGERY. NO VISITOR MAY SPEND THE NIGHT. VISITOR ARE ALLOWED TO STAY UNTIL 800 PM.                                    DO NOT WEAR JEWERLY, MAKE UP, OR NAIL POLISH ON FINGERNAILS. DO NOT WEAR LOTIONS, POWDERS, PERFUMES OR DEODORANT. DO NOT SHAVE FOR 24 HOURS PRIOR TO DAY OF SURGERY. . CONTACTS, GLASSES, OR DENTURES MAY NOT BE WORN TO SURGERY.                                    [facility] IS NOT RESPONSIBLE  FOR ANY BELONGINGS.                                                                    Marland Kitchen                                                                                                    [facility] - Preparing for Surgery Before surgery, you can play an important role.  Because skin is not sterile, your skin needs to be as free of germs as possible.  You can reduce the number of germs on your skin by washing with CHG  (chlorahexidine gluconate) soap before surgery.  CHG is an antiseptic cleaner which kills germs and bonds with the skin to continue killing germs even after washing. Please DO NOT use if you have an allergy to CHG or antibacterial soaps.  If your skin becomes reddened/irritated stop using the CHG and inform your nurse when you arrive at Short Stay. Do not shave (including legs and underarms) for at least 48 hours prior to the first CHG shower.  You may shave your face/neck. Please follow these instructions carefully:  1.  Shower with CHG Soap the night before surgery and the  morning of Surgery.  2.  If you choose to wash your hair, wash your hair first as usual with your  normal  shampoo.  3.  After you shampoo, rinse your hair and body thoroughly to remove the  shampoo.                           4.  Use CHG as you would any other liquid soap.  You can apply chg directly  to the skin and wash                       Gently with a scrungie or clean washcloth.  5.  Apply the CHG Soap to your body ONLY FROM THE NECK DOWN.  Do not use on face/ open                           Wound or open sores. Avoid contact with eyes, ears mouth and genitals (private parts).                       Wash face,  Genitals (private parts) with your normal soap.             6.  Wash thoroughly, paying special attention to the area where your surgery  will be performed.  7.  Thoroughly rinse your body with warm water from the neck down.  8.  DO NOT shower/wash with your normal soap after using and rinsing off  the CHG Soap.                9.  Pat yourself dry with a clean towel.            10.  Wear clean pajamas.            11.  Place clean sheets on your bed the night of your first shower and do not  sleep with pets. Day of Surgery : Do not apply any lotions/deodorants the morning of surgery.  Please wear clean clothes to the hospital/surgery center.  FAILURE TO FOLLOW THESE INSTRUCTIONS MAY RESULT IN THE CANCELLATION OF  YOUR SURGERY PATIENT SIGNATURE_________________________________  NURSE SIGNATURE__________________________________  ________________________________________________________________________  WHAT IS A BLOOD TRANSFUSION? Blood Transfusion Information  A transfusion is the replacement of blood or some of its parts. Blood is made up of multiple cells which provide different functions.  Red blood cells carry oxygen and are used for blood loss replacement.  White blood cells fight against infection.  Platelets control bleeding.  Plasma helps clot blood.  Other blood products are available for specialized needs, such as hemophilia or other clotting disorders. BEFORE THE TRANSFUSION  Who gives blood for transfusions?   Healthy volunteers who are fully evaluated to make sure their blood is safe. This is blood bank blood. Transfusion therapy is the safest it has ever been in the practice of medicine. Before blood is taken from a donor, a complete history is taken to make sure that person has no history of diseases nor engages in risky social behavior (examples are intravenous drug use or sexual activity with multiple partners). The donor's travel history is screened to minimize risk of transmitting infections, such as malaria. The donated blood is tested for signs of infectious diseases, such as HIV and hepatitis. The blood is then tested to be sure it is compatible with you in order to minimize the chance of a transfusion reaction. If you or a relative donates blood, this is often done in anticipation of surgery and is not appropriate for emergency situations. It takes many days to process the donated blood. RISKS AND COMPLICATIONS Although transfusion therapy is very safe and saves many lives, the main dangers of transfusion include:   Getting an infectious disease.  Developing a transfusion reaction. This is an allergic reaction to something in the blood you were given. Every precaution is  taken to prevent this. The decision to have a blood transfusion has been considered carefully by your caregiver before blood is given. Blood is not given unless the benefits outweigh the risks. AFTER THE TRANSFUSION  Right after receiving a blood transfusion, you will usually feel much better and more energetic. This is  especially true if your red blood cells have gotten low (anemic). The transfusion raises the level of the red blood cells which carry oxygen, and this usually causes an energy increase.  The nurse administering the transfusion will monitor you carefully for complications. HOME CARE INSTRUCTIONS  No special instructions are needed after a transfusion. You may find your energy is better. Speak with your caregiver about any limitations on activity for underlying diseases you may have. SEEK MEDICAL CARE IF:   Your condition is not improving after your transfusion.  You develop redness or irritation at the intravenous (IV) site. SEEK IMMEDIATE MEDICAL CARE IF:  Any of the following symptoms occur over the next 12 hours:  Shaking chills.  You have a temperature by mouth above 102 F (38.9 C), not controlled by medicine.  Chest, back, or muscle pain.  People around you feel you are not acting correctly or are confused.  Shortness of breath or difficulty breathing.  Dizziness and fainting.  You get a rash or develop hives.  You have a decrease in urine output.  Your urine turns a dark color or changes to pink, red, or brown. Any of the following symptoms occur over the next 10 days:  You have a temperature by mouth above 102 F (38.9 C), not controlled by medicine.  Shortness of breath.  Weakness after normal activity.  The white part of the eye turns yellow (jaundice).  You have a decrease in the amount of urine or are urinating less often.  Your urine turns a dark color or changes to pink, red, or brown. Document Released: 04/11/2000 Document Revised:  07/07/2011 Document Reviewed: 11/29/2007 [facility] Patient Information 2014 [place], [place].  _______________________________________________________________________

## 2019-03-28 ENCOUNTER — Other Ambulatory Visit (HOSPITAL_COMMUNITY)
Admission: RE | Admit: 2019-03-28 | Discharge: 2019-03-28 | Disposition: A | Discharge status: Home / Self Care | Payer: 59 | Source: Ambulatory Visit | Attending: Obstetrics and Gynecology | Admitting: Obstetrics and Gynecology

## 2019-03-28 ENCOUNTER — Other Ambulatory Visit: Payer: Self-pay | Admitting: Adult Health Nurse Practitioner

## 2019-03-28 DIAGNOSIS — Z01812 Encounter for preprocedural laboratory examination: Secondary | ICD-10-CM | POA: Insufficient documentation

## 2019-03-28 DIAGNOSIS — E119 Type 2 diabetes mellitus without complications: Secondary | ICD-10-CM

## 2019-03-28 DIAGNOSIS — Z20828 Contact with and (suspected) exposure to other viral communicable diseases: Secondary | ICD-10-CM | POA: Diagnosis not present

## 2019-03-28 DIAGNOSIS — I10 Essential (primary) hypertension: Secondary | ICD-10-CM

## 2019-03-28 MED ORDER — METFORMIN HCL 500 MG PO TABS: 500.0000 mg | ORAL_TABLET | Freq: Two times a day (BID) | ORAL | 0 refills | Status: DC

## 2019-03-28 NOTE — Telephone Encounter (Signed)
Medication: metFORMIN (GLUCOPHAGE) 500 MG tablet [id number]  Patient has one pill left and will be going into surgery on Thursday.  Has the patient contacted their pharmacy? Yes  (Agent: If no, request that the patient contact the pharmacy for the refill.) (Agent: If yes, when and what did the pharmacy advise?)  Preferred Pharmacy (with phone number or street name): [facility] [phone] - [place], [address] AT [facility] [address] [phone] (Phone) [phone] (Fax)    Agent: Please be advised that RX refills may take up to 3 business days. We ask that you follow-up with your pharmacy.

## 2019-03-29 ENCOUNTER — Encounter (HOSPITAL_COMMUNITY)
Admission: RE | Admit: 2019-03-29 | Discharge: 2019-03-29 | Disposition: A | Discharge status: Home / Self Care | Payer: 59 | Source: Ambulatory Visit | Attending: Obstetrics and Gynecology | Admitting: Obstetrics and Gynecology

## 2019-03-29 ENCOUNTER — Encounter (HOSPITAL_COMMUNITY): Payer: Self-pay

## 2019-03-29 ENCOUNTER — Other Ambulatory Visit: Payer: Self-pay

## 2019-03-29 DIAGNOSIS — Z01812 Encounter for preprocedural laboratory examination: Secondary | ICD-10-CM | POA: Diagnosis present

## 2019-03-29 LAB — CBC
HCT: 42.6 % (ref 36.0–46.0)
Hemoglobin: 13.8 g/dL (ref 12.0–15.0)
MCH: 28.8 pg (ref 26.0–34.0)
MCHC: 32.4 g/dL (ref 30.0–36.0)
MCV: 88.9 fL (ref 80.0–100.0)
Platelets: 313 10*3/uL (ref 150–400)
RBC: 4.79 MIL/uL (ref 3.87–5.11)
RDW: 12.6 % (ref 11.5–15.5)
WBC: 7.3 10*3/uL (ref 4.0–10.5)
nRBC: 0 % (ref 0.0–0.2)

## 2019-03-29 LAB — BASIC METABOLIC PANEL
Anion gap: 10 (ref 5–15)
BUN: 17 mg/dL (ref 6–20)
CO2: 24 mmol/L (ref 22–32)
Calcium: 9.3 mg/dL (ref 8.9–10.3)
Chloride: 101 mmol/L (ref 98–111)
Creatinine, Ser: 0.9 mg/dL (ref 0.44–1.00)
GFR calc Af Amer: >60 mL/min (ref >60)
GFR calc non Af Amer: >60 mL/min (ref >60)
Glucose, Bld: 104 mg/dL — ABNORMAL HIGH (ref 70–99)
Potassium: 3.5 mmol/L (ref 3.5–5.1)
Sodium: 135 mmol/L (ref 135–145)

## 2019-03-29 LAB — NOVEL CORONAVIRUS, NAA (HOSP ORDER, SEND-OUT TO REF LAB; TAT 18-24 HRS): SARS-CoV-2, NAA: NOT DETECTED

## 2019-03-29 LAB — HEMOGLOBIN A1C
Hgb A1c MFr Bld: 7.9 % — ABNORMAL HIGH (ref 4.8–5.6)
Mean Plasma Glucose: 180.03 mg/dL

## 2019-03-29 LAB — ABO/RH: ABO/RH(D): A POS

## 2019-03-29 LAB — GLUCOSE, CAPILLARY: Glucose-Capillary: 84 mg/dL (ref 70–99)

## 2019-03-29 NOTE — Progress Notes (Signed)
PCP - Wilhelmenia Blase, NP Cardiologist - N/A  Chest x-ray - N/A EKG -01/19/2019  Stress Test - N/A ECHO - N/A Cardiac Cath - N/A  Sleep Study - N/A CPAP - N/A  Fasting Blood Sugar - 120-135 Checks Blood Sugar __2-3___ times a day  Blood Thinner Instructions:N/A Aspirin Instructions:N/A Last Dose:N/A  Anesthesia review:   Patient has a history of DM,  And gestational diabetes .  Patient denies shortness of breath, fever, cough and chest pain at PAT appointment   Patient verbalized understanding of instructions that were given to them at the PAT appointment. Patient was also instructed that they will need to review over the PAT instructions again at home before surgery.

## 2019-03-30 NOTE — Anesthesia Preprocedure Evaluation (Addendum)
Anesthesia Evaluation  Patient identified by MRN, date of birth, ID band Patient awake    Reviewed: Allergy & Precautions, NPO status , Patient's Chart, lab work & pertinent test results  History of Anesthesia Complications Negative for: history of anesthetic complications  Airway Mallampati: I  TM Distance: >3 FB Neck ROM: Full    Dental  (+) Dental Advisory Given   Pulmonary neg pulmonary ROS,  03/28/2019 SARS CoV2 NEG   breath sounds clear to auscultation       Cardiovascular hypertension, Pt. on medications  Rhythm:Regular Rate:Normal     Neuro/Psych negative neurological ROS     GI/Hepatic Neg liver ROS, GERD  Controlled,  Endo/Other  diabetes (glu 127), Oral Hypoglycemic AgentsMorbid obesity  Renal/GU negative Renal ROS     Musculoskeletal   Abdominal (+) + obese,   Peds  Hematology negative hematology ROS (+)   Anesthesia Other Findings   Reproductive/Obstetrics                            Anesthesia Physical Anesthesia Plan  ASA: II  Anesthesia Plan: General   Post-op Pain Management:    Induction: Intravenous  PONV Risk Score and Plan: 4 or greater and Scopolamine patch - Pre-op, Dexamethasone and Ondansetron  Airway Management Planned: Oral ETT  Additional Equipment: None  Intra-op Plan:   Post-operative Plan: Extubation in OR  Informed Consent: I have reviewed the patients History and Physical, chart, labs and discussed the procedure including the risks, benefits and alternatives for the proposed anesthesia with the patient or authorized representative who has indicated his/her understanding and acceptance.     Dental advisory given  Plan Discussed with: CRNA and Surgeon  Anesthesia Plan Comments:        Anesthesia Quick Evaluation

## 2019-03-30 NOTE — H&P (Signed)
Yvonne Fisher is an 43 y.o. 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  Pt also w HTN, DM2, and hiradenitis suppurtiva.  D/w pt r/b/a of surgery - LAVH/BS/cystoscopy.  Will proceed.    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    Past Medical History:  Diagnosis Date  . Arthritis    right elbow   . Diabetes ([place])   . GERD (gastroesophageal reflux disease)   . Gestational diabetes    diet controlled  . Hidradenitis 11/2011   left axilla  . Pregnancy induced hypertension 2015   "still keeping eye on it" (01/18/2014)  . Rash 12/01/2011   bilat. axilla  . Thyroid nodule     Past Surgical History:  Procedure Laterality Date  . CHOLECYSTECTOMY  04/15/2011   Procedure: LAPAROSCOPIC CHOLECYSTECTOMY WITH INTRAOPERATIVE CHOLANGIOGRAM;  Surgeon: Adin Hector, MD;  Location: [facility];  Service: General;  Laterality: N/A;  laparoscopic cholecystectomy with cholangiogram  . ELBOW ARTHROSCOPY  1997   right elbow  . HYDRADENITIS EXCISION  12/05/2011   Procedure: EXCISION HYDRADENITIS AXILLA;  Surgeon: Harl Bowie, MD;  Location: [facility];  Service: General;  Laterality: Left;  wide excision hidradenitis left axilla  . THYROID LOBECTOMY Left 01/18/2014  . THYROIDECTOMY Left 01/18/2014   Procedure: LEFT THYROID LOBECTOMY;  Surgeon: Coralie Keens, MD;  Location: [facility];  Service: General;  Laterality: Left;    Family History  Problem Relation Age of Onset  . Hypertension Mother   . Multiple sclerosis Mother   . Diabetes Father   . Heart disease Father 48       AMI    Social History:  reports that she has never smoked. She has never used smokeless tobacco. She reports current alcohol use. She reports that she does not use drugs. single  Allergies:   Allergies  Allergen Reactions  . Adhesive [Tape] Other (See Comments)    TEARS SKIN  . Prevacid [Lansoprazole] Hives    Meds: glipizide, lisinopril 20/HCYZ 25mg ; metformin 500mg ; Mirena IUD    Review of Systems  Constitutional: Negative.   HENT: Negative.   Eyes: Negative.   Respiratory: Negative.   Cardiovascular: Negative.   Gastrointestinal: Negative.   Genitourinary: Negative.   Musculoskeletal: Negative.   Skin: Negative.   Neurological: Negative.   Psychiatric/Behavioral: Negative.     Last menstrual period 03/16/2019. Physical Exam  Constitutional: She appears well-developed and well-nourished.  HENT:  Head: Normocephalic and atraumatic.  Cardiovascular: Normal rate and regular rhythm.  Respiratory: Effort normal and breath sounds normal. No respiratory distress. She has no wheezes.  GI: Soft. Bowel sounds are normal. She exhibits no distension. There is no abdominal tenderness.  Musculoskeletal: Normal range of motion.  Neurological: She is alert.  Skin: Skin is warm and dry.  Psychiatric: She has a normal mood and affect. Her behavior is normal.   LEEP CIN 3  Assessment/Plan: [id number] with cervical dysplasia for LAVH/BS/cysto D/w pt r/b/a Ancef for prophylaxis   Yvonne Fisher 03/30/2019, 8:48 PM

## 2019-03-31 ENCOUNTER — Ambulatory Visit (HOSPITAL_BASED_OUTPATIENT_CLINIC_OR_DEPARTMENT_OTHER): Payer: 59 | Admitting: Anesthesiology

## 2019-03-31 ENCOUNTER — Encounter (HOSPITAL_BASED_OUTPATIENT_CLINIC_OR_DEPARTMENT_OTHER)
Admission: RE | Disposition: A | Discharge status: Home / Self Care | Payer: Self-pay | Source: Home / Self Care | Attending: Obstetrics and Gynecology

## 2019-03-31 ENCOUNTER — Other Ambulatory Visit: Payer: Self-pay

## 2019-03-31 ENCOUNTER — Encounter (HOSPITAL_BASED_OUTPATIENT_CLINIC_OR_DEPARTMENT_OTHER): Payer: Self-pay | Admitting: *Deleted

## 2019-03-31 ENCOUNTER — Observation Stay (HOSPITAL_BASED_OUTPATIENT_CLINIC_OR_DEPARTMENT_OTHER)
Admission: RE | Admit: 2019-03-31 | Discharge: 2019-04-01 | Disposition: A | Discharge status: Home / Self Care | Payer: 59 | Attending: Obstetrics and Gynecology | Admitting: Obstetrics and Gynecology

## 2019-03-31 ENCOUNTER — Ambulatory Visit (HOSPITAL_BASED_OUTPATIENT_CLINIC_OR_DEPARTMENT_OTHER): Payer: 59 | Admitting: Physician Assistant

## 2019-03-31 DIAGNOSIS — M19021 Primary osteoarthritis, right elbow: Secondary | ICD-10-CM | POA: Insufficient documentation

## 2019-03-31 DIAGNOSIS — E119 Type 2 diabetes mellitus without complications: Secondary | ICD-10-CM | POA: Diagnosis not present

## 2019-03-31 DIAGNOSIS — I10 Essential (primary) hypertension: Secondary | ICD-10-CM | POA: Insufficient documentation

## 2019-03-31 DIAGNOSIS — E041 Nontoxic single thyroid nodule: Secondary | ICD-10-CM | POA: Diagnosis not present

## 2019-03-31 DIAGNOSIS — Z79899 Other long term (current) drug therapy: Secondary | ICD-10-CM | POA: Insufficient documentation

## 2019-03-31 DIAGNOSIS — Z90710 Acquired absence of both cervix and uterus: Secondary | ICD-10-CM | POA: Diagnosis present

## 2019-03-31 DIAGNOSIS — Z975 Presence of (intrauterine) contraceptive device: Secondary | ICD-10-CM | POA: Diagnosis not present

## 2019-03-31 DIAGNOSIS — K219 Gastro-esophageal reflux disease without esophagitis: Secondary | ICD-10-CM | POA: Diagnosis not present

## 2019-03-31 DIAGNOSIS — Z7984 Long term (current) use of oral hypoglycemic drugs: Secondary | ICD-10-CM | POA: Diagnosis not present

## 2019-03-31 DIAGNOSIS — N800 Endometriosis of uterus: Secondary | ICD-10-CM | POA: Insufficient documentation

## 2019-03-31 DIAGNOSIS — N871 Moderate cervical dysplasia: Principal | ICD-10-CM | POA: Insufficient documentation

## 2019-03-31 DIAGNOSIS — N838 Other noninflammatory disorders of ovary, fallopian tube and broad ligament: Secondary | ICD-10-CM | POA: Insufficient documentation

## 2019-03-31 DIAGNOSIS — N879 Dysplasia of cervix uteri, unspecified: Secondary | ICD-10-CM | POA: Diagnosis present

## 2019-03-31 DIAGNOSIS — Z888 Allergy status to other drugs, medicaments and biological substances status: Secondary | ICD-10-CM | POA: Diagnosis not present

## 2019-03-31 HISTORY — PX: LAPAROSCOPIC VAGINAL HYSTERECTOMY WITH SALPINGECTOMY: SHX6680

## 2019-03-31 HISTORY — DX: Acquired absence of both cervix and uterus: Z90.710

## 2019-03-31 HISTORY — PX: CYSTOSCOPY: SHX5120

## 2019-03-31 LAB — GLUCOSE, CAPILLARY
Glucose-Capillary: 127 mg/dL — ABNORMAL HIGH (ref 70–99)
Glucose-Capillary: 189 mg/dL — ABNORMAL HIGH (ref 70–99)
Glucose-Capillary: 164 mg/dL — ABNORMAL HIGH (ref 70–99)
Glucose-Capillary: 178 mg/dL — ABNORMAL HIGH (ref 70–99)
Glucose-Capillary: 104 mg/dL — ABNORMAL HIGH (ref 70–99)

## 2019-03-31 LAB — TYPE AND SCREEN
ABO/RH(D): A POS
Antibody Screen: NEGATIVE

## 2019-03-31 LAB — POCT PREGNANCY, URINE: Preg Test, Ur: NEGATIVE

## 2019-03-31 SURGERY — HYSTERECTOMY, VAGINAL, LAPAROSCOPY-ASSISTED, WITH SALPINGECTOMY
Anesthesia: General | Site: Vagina

## 2019-03-31 MED ORDER — MIDAZOLAM HCL 2 MG/2ML IJ SOLN
INTRAMUSCULAR | Status: AC
  Filled 2019-03-31: qty 2

## 2019-03-31 MED ORDER — ALUM & MAG HYDROXIDE-SIMETH 200-200-20 MG/5ML PO SUSP
30.0000 mL | ORAL | Status: DC | PRN
  Filled 2019-03-31: qty 30

## 2019-03-31 MED ORDER — VASOPRESSIN 20 UNIT/ML IV SOLN
INTRAVENOUS | Status: DC | PRN
  Administered 2019-03-31: 20 mL via INTRAMUSCULAR

## 2019-03-31 MED ORDER — HYDROMORPHONE 1 MG/ML IV SOLN
INTRAVENOUS | Status: DC
  Filled 2019-03-31: qty 30

## 2019-03-31 MED ORDER — LACTATED RINGERS IV SOLN
INTRAVENOUS | Status: DC
  Administered 2019-03-31: 12:00:00 via INTRAVENOUS
  Filled 2019-03-31 (×2): qty 1000

## 2019-03-31 MED ORDER — FLUORESCEIN SODIUM 10 % IV SOLN
INTRAVENOUS | Status: DC | PRN
  Administered 2019-03-31: 1 mL via INTRAVENOUS

## 2019-03-31 MED ORDER — OXYCODONE-ACETAMINOPHEN 5-325 MG PO TABS
1.0000 | ORAL_TABLET | ORAL | Status: DC | PRN
  Administered 2019-03-31 – 2019-04-01 (×3): 1 via ORAL
  Filled 2019-03-31: qty 2

## 2019-03-31 MED ORDER — SODIUM CHLORIDE 0.9% FLUSH
9.0000 mL | INTRAVENOUS | Status: DC | PRN
  Filled 2019-03-31: qty 10

## 2019-03-31 MED ORDER — DIPHENHYDRAMINE HCL 12.5 MG/5ML PO ELIX
12.5000 mg | ORAL_SOLUTION | Freq: Four times a day (QID) | ORAL | Status: DC | PRN
  Filled 2019-03-31: qty 5

## 2019-03-31 MED ORDER — GUAIFENESIN 100 MG/5ML PO SOLN
15.0000 mL | ORAL | Status: DC | PRN
  Filled 2019-03-31: qty 15

## 2019-03-31 MED ORDER — MIDAZOLAM HCL 2 MG/2ML IJ SOLN
0.5000 mg | Freq: Once | INTRAMUSCULAR | Status: DC | PRN
  Filled 2019-03-31: qty 2

## 2019-03-31 MED ORDER — INSULIN ASPART 100 UNIT/ML ~~LOC~~ SOLN
SUBCUTANEOUS | Status: AC
  Filled 2019-03-31: qty 1

## 2019-03-31 MED ORDER — LIDOCAINE 2% (20 MG/ML) 5 ML SYRINGE
INTRAMUSCULAR | Status: DC | PRN
  Administered 2019-03-31: 40 mg via INTRAVENOUS

## 2019-03-31 MED ORDER — FENTANYL CITRATE (PF) 100 MCG/2ML IJ SOLN
INTRAMUSCULAR | Status: AC
  Filled 2019-03-31: qty 2

## 2019-03-31 MED ORDER — ONDANSETRON HCL 4 MG PO TABS
4.0000 mg | ORAL_TABLET | Freq: Four times a day (QID) | ORAL | Status: DC | PRN
  Filled 2019-03-31: qty 1

## 2019-03-31 MED ORDER — INSULIN ASPART 100 UNIT/ML ~~LOC~~ SOLN
0.0000 [IU] | Freq: Three times a day (TID) | SUBCUTANEOUS | Status: DC
  Filled 2019-03-31: qty 0.15

## 2019-03-31 MED ORDER — INSULIN ASPART 100 UNIT/ML ~~LOC~~ SOLN
0.0000 [IU] | SUBCUTANEOUS | Status: DC
  Administered 2019-03-31 (×2): 3 [IU] via SUBCUTANEOUS
  Filled 2019-03-31: qty 0.15

## 2019-03-31 MED ORDER — FENTANYL CITRATE (PF) 100 MCG/2ML IJ SOLN
INTRAMUSCULAR | Status: DC | PRN
  Administered 2019-03-31: 50 ug via INTRAVENOUS
  Administered 2019-03-31 (×3): 25 ug via INTRAVENOUS
  Administered 2019-03-31: 50 ug via INTRAVENOUS
  Administered 2019-03-31: 25 ug via INTRAVENOUS

## 2019-03-31 MED ORDER — ONDANSETRON HCL 4 MG/2ML IJ SOLN
4.0000 mg | Freq: Four times a day (QID) | INTRAMUSCULAR | Status: DC | PRN
  Filled 2019-03-31: qty 2

## 2019-03-31 MED ORDER — HYDROMORPHONE HCL 1 MG/ML IJ SOLN
0.2000 mg | INTRAMUSCULAR | Status: DC | PRN
  Filled 2019-03-31: qty 1

## 2019-03-31 MED ORDER — ONDANSETRON HCL 4 MG/2ML IJ SOLN
INTRAMUSCULAR | Status: AC
  Filled 2019-03-31: qty 2

## 2019-03-31 MED ORDER — PHENYLEPHRINE 40 MCG/ML (10ML) SYRINGE FOR IV PUSH (FOR BLOOD PRESSURE SUPPORT)
PREFILLED_SYRINGE | INTRAVENOUS | Status: AC
  Filled 2019-03-31: qty 10

## 2019-03-31 MED ORDER — IBUPROFEN 800 MG PO TABS
800.0000 mg | ORAL_TABLET | Freq: Three times a day (TID) | ORAL | Status: DC | PRN
  Filled 2019-03-31: qty 1

## 2019-03-31 MED ORDER — CEFAZOLIN SODIUM-DEXTROSE 2-3 GM-%(50ML) IV SOLR
INTRAVENOUS | Status: DC | PRN
  Administered 2019-03-31: 2 g via INTRAVENOUS

## 2019-03-31 MED ORDER — OXYCODONE-ACETAMINOPHEN 5-325 MG PO TABS
ORAL_TABLET | ORAL | Status: AC
  Filled 2019-03-31: qty 1

## 2019-03-31 MED ORDER — AMOXICILLIN 500 MG PO CAPS
500.0000 mg | ORAL_CAPSULE | Freq: Two times a day (BID) | ORAL | Status: DC
  Administered 2019-03-31 – 2019-04-01 (×2): 500 mg via ORAL
  Filled 2019-03-31: qty 1

## 2019-03-31 MED ORDER — ACETAMINOPHEN 500 MG PO TABS
1000.0000 mg | ORAL_TABLET | Freq: Once | ORAL | Status: AC
  Administered 2019-03-31: 1000 mg via ORAL
  Filled 2019-03-31: qty 2

## 2019-03-31 MED ORDER — LISINOPRIL-HYDROCHLOROTHIAZIDE 20-25 MG PO TABS
1.0000 | ORAL_TABLET | Freq: Every day | ORAL | Status: DC
  Administered 2019-03-31: 1 via ORAL
  Filled 2019-03-31: qty 1

## 2019-03-31 MED ORDER — KETOROLAC TROMETHAMINE 30 MG/ML IJ SOLN
INTRAMUSCULAR | Status: AC
  Filled 2019-03-31: qty 1

## 2019-03-31 MED ORDER — HYDROMORPHONE HCL 1 MG/ML IJ SOLN
0.2500 mg | INTRAMUSCULAR | Status: DC | PRN
  Administered 2019-03-31 (×2): 0.5 mg via INTRAVENOUS
  Filled 2019-03-31: qty 0.5

## 2019-03-31 MED ORDER — MIDAZOLAM HCL 2 MG/2ML IJ SOLN
INTRAMUSCULAR | Status: DC | PRN
  Administered 2019-03-31: 2 mg via INTRAVENOUS

## 2019-03-31 MED ORDER — CEFAZOLIN (ANCEF) 1 G IV SOLR
2.0000 g | INTRAVENOUS | Status: AC
  Filled 2019-03-31: qty 2

## 2019-03-31 MED ORDER — SCOPOLAMINE 1 MG/3DAYS TD PT72
1.0000 | MEDICATED_PATCH | Freq: Once | TRANSDERMAL | Status: DC
  Administered 2019-03-31: 1.5 mg via TRANSDERMAL
  Filled 2019-03-31: qty 1

## 2019-03-31 MED ORDER — DIPHENHYDRAMINE HCL 50 MG/ML IJ SOLN
12.5000 mg | Freq: Four times a day (QID) | INTRAMUSCULAR | Status: DC | PRN
  Filled 2019-03-31: qty 0.25

## 2019-03-31 MED ORDER — BUPIVACAINE HCL (PF) 0.25 % IJ SOLN
INTRAMUSCULAR | Status: DC | PRN
  Administered 2019-03-31: 10 mL

## 2019-03-31 MED ORDER — SUGAMMADEX SODIUM 200 MG/2ML IV SOLN
INTRAVENOUS | Status: DC | PRN
  Administered 2019-03-31: 200 mg via INTRAVENOUS

## 2019-03-31 MED ORDER — ACETAMINOPHEN 500 MG PO TABS
ORAL_TABLET | ORAL | Status: AC
  Filled 2019-03-31: qty 2

## 2019-03-31 MED ORDER — ROCURONIUM BROMIDE 10 MG/ML (PF) SYRINGE
PREFILLED_SYRINGE | INTRAVENOUS | Status: DC | PRN
  Administered 2019-03-31: 60 mg via INTRAVENOUS
  Administered 2019-03-31: 10 mg via INTRAVENOUS

## 2019-03-31 MED ORDER — MEPERIDINE HCL 25 MG/ML IJ SOLN
6.2500 mg | INTRAMUSCULAR | Status: DC | PRN
  Filled 2019-03-31: qty 1

## 2019-03-31 MED ORDER — SIMETHICONE 80 MG PO CHEW
80.0000 mg | CHEWABLE_TABLET | Freq: Four times a day (QID) | ORAL | Status: DC | PRN
  Filled 2019-03-31: qty 1

## 2019-03-31 MED ORDER — PROPOFOL 10 MG/ML IV BOLUS
INTRAVENOUS | Status: AC
  Filled 2019-03-31: qty 40

## 2019-03-31 MED ORDER — LIDOCAINE 2% (20 MG/ML) 5 ML SYRINGE
INTRAMUSCULAR | Status: AC
  Filled 2019-03-31: qty 5

## 2019-03-31 MED ORDER — PROPOFOL 10 MG/ML IV BOLUS
INTRAVENOUS | Status: DC | PRN
  Administered 2019-03-31: 130 mg via INTRAVENOUS

## 2019-03-31 MED ORDER — SODIUM CHLORIDE 0.9 % IR SOLN
Status: DC | PRN
  Administered 2019-03-31: 1000 mL

## 2019-03-31 MED ORDER — METFORMIN HCL 500 MG PO TABS
500.0000 mg | ORAL_TABLET | Freq: Two times a day (BID) | ORAL | Status: DC
  Administered 2019-03-31 – 2019-04-01 (×2): 500 mg via ORAL
  Filled 2019-03-31: qty 1

## 2019-03-31 MED ORDER — ROCURONIUM BROMIDE 10 MG/ML (PF) SYRINGE
PREFILLED_SYRINGE | INTRAVENOUS | Status: AC
  Filled 2019-03-31: qty 10

## 2019-03-31 MED ORDER — GLIPIZIDE 5 MG PO TABS
5.0000 mg | ORAL_TABLET | Freq: Two times a day (BID) | ORAL | Status: DC
  Administered 2019-03-31 – 2019-04-01 (×2): 5 mg via ORAL
  Filled 2019-03-31: qty 1

## 2019-03-31 MED ORDER — PROMETHAZINE HCL 25 MG/ML IJ SOLN
6.2500 mg | INTRAMUSCULAR | Status: DC | PRN
  Filled 2019-03-31: qty 1

## 2019-03-31 MED ORDER — LACTATED RINGERS IV SOLN
INTRAVENOUS | Status: DC
  Administered 2019-03-31 (×2): via INTRAVENOUS
  Filled 2019-03-31: qty 1000

## 2019-03-31 MED ORDER — ONDANSETRON HCL 4 MG/2ML IJ SOLN
INTRAMUSCULAR | Status: DC | PRN
  Administered 2019-03-31: 4 mg via INTRAVENOUS

## 2019-03-31 MED ORDER — FLUORESCEIN SODIUM 10 % IV SOLN
INTRAVENOUS | Status: AC
  Filled 2019-03-31: qty 5

## 2019-03-31 MED ORDER — HYDROMORPHONE HCL 1 MG/ML IJ SOLN
INTRAMUSCULAR | Status: AC
  Filled 2019-03-31: qty 1

## 2019-03-31 MED ORDER — MENTHOL 3 MG MT LOZG
1.0000 | LOZENGE | OROMUCOSAL | Status: DC | PRN
  Administered 2019-04-01: 3 mg via ORAL
  Filled 2019-03-31: qty 9

## 2019-03-31 MED ORDER — PHENYLEPHRINE 40 MCG/ML (10ML) SYRINGE FOR IV PUSH (FOR BLOOD PRESSURE SUPPORT)
PREFILLED_SYRINGE | INTRAVENOUS | Status: DC | PRN
  Administered 2019-03-31: 40 ug via INTRAVENOUS
  Administered 2019-03-31: 80 ug via INTRAVENOUS
  Administered 2019-03-31: 40 ug via INTRAVENOUS

## 2019-03-31 MED ORDER — SCOPOLAMINE 1 MG/3DAYS TD PT72
MEDICATED_PATCH | TRANSDERMAL | Status: AC
  Filled 2019-03-31: qty 1

## 2019-03-31 MED ORDER — NALOXONE HCL 0.4 MG/ML IJ SOLN
0.4000 mg | INTRAMUSCULAR | Status: DC | PRN
  Filled 2019-03-31: qty 1

## 2019-03-31 MED ORDER — KETOROLAC TROMETHAMINE 30 MG/ML IJ SOLN
INTRAMUSCULAR | Status: DC | PRN
  Administered 2019-03-31: 30 mg via INTRAVENOUS

## 2019-03-31 MED ORDER — CEFAZOLIN SODIUM 1 G IJ SOLR
INTRAMUSCULAR | Status: AC
  Filled 2019-03-31: qty 20

## 2019-03-31 SURGICAL SUPPLY — 44 items
ADH SKN CLS APL DERMABOND .7 (GAUZE/BANDAGES/DRESSINGS) ×2
APL SRG 38 LTWT LNG FL B (MISCELLANEOUS)
APPLICATOR ARISTA FLEXITIP XL (MISCELLANEOUS) IMPLANT
CABLE HIGH FREQUENCY MONO STRZ (ELECTRODE) IMPLANT
CONT SPEC 4OZ CLIKSEAL STRL BL (MISCELLANEOUS) ×3 IMPLANT
COVER BACK TABLE 60X90IN (DRAPES) ×3 IMPLANT
COVER MAYO STAND STRL (DRAPES) ×6 IMPLANT
COVER WAND RF STERILE (DRAPES) ×3 IMPLANT
DECANTER SPIKE VIAL GLASS SM (MISCELLANEOUS) IMPLANT
DERMABOND ADVANCED (GAUZE/BANDAGES/DRESSINGS) ×1
DERMABOND ADVANCED .7 DNX12 (GAUZE/BANDAGES/DRESSINGS) ×2 IMPLANT
DRSG COVADERM PLUS 2X2 (GAUZE/BANDAGES/DRESSINGS) IMPLANT
DRSG OPSITE POSTOP 3X4 (GAUZE/BANDAGES/DRESSINGS) IMPLANT
DURAPREP 26ML APPLICATOR (WOUND CARE) ×3 IMPLANT
ELECT REM PT RETURN 9FT ADLT (ELECTROSURGICAL) ×3
ELECTRODE REM PT RTRN 9FT ADLT (ELECTROSURGICAL) IMPLANT
GAUZE 4X4 16PLY RFD (DISPOSABLE) ×3 IMPLANT
GLOVE BIO SURGEON STRL SZ 6.5 (GLOVE) ×9 IMPLANT
HEMOSTAT ARISTA ABSORB 3G PWDR (HEMOSTASIS) IMPLANT
HIBICLENS CHG 4% 4OZ (MISCELLANEOUS) ×3 IMPLANT
NEEDLE INSUFFLATION 120MM (ENDOMECHANICALS) ×3 IMPLANT
NS IRRIG 1000ML POUR BTL (IV SOLUTION) ×3 IMPLANT
PACK LAVH (CUSTOM PROCEDURE TRAY) ×3 IMPLANT
PACK TRENDGUARD 450 HYBRID PRO (MISCELLANEOUS) IMPLANT
PAD OB MATERNITY 4.3X12.25 (PERSONAL CARE ITEMS) ×3 IMPLANT
PROTECTOR NERVE ULNAR (MISCELLANEOUS) ×5 IMPLANT
SET IRRIG TUBING LAPAROSCOPIC (IRRIGATION / IRRIGATOR) ×1 IMPLANT
SET IRRIG Y TYPE TUR BLADDER L (SET/KITS/TRAYS/PACK) ×3 IMPLANT
SET TUBE SMOKE EVAC HIGH FLOW (TUBING) ×3 IMPLANT
SHEARS HARMONIC ACE PLUS 36CM (ENDOMECHANICALS) ×3 IMPLANT
SUT VIC AB 0 CT1 27 (SUTURE) ×6
SUT VIC AB 0 CT1 27XCR 8 STRN (SUTURE) IMPLANT
SUT VIC AB 0 CT1 36 (SUTURE) ×1 IMPLANT
SUT VIC AB 2-0 CT1 (SUTURE) IMPLANT
SUT VIC AB 3-0 SH 27 (SUTURE)
SUT VIC AB 3-0 SH 27X BRD (SUTURE) IMPLANT
SUT VIC AB 4-0 PS2 27 (SUTURE) ×3 IMPLANT
SUT VICRYL 0 TIES 12 18 (SUTURE) IMPLANT
SUT VICRYL 0 UR6 27IN ABS (SUTURE) IMPLANT
TOWEL OR 17X26 10 PK STRL BLUE (TOWEL DISPOSABLE) ×3 IMPLANT
TRAY FOLEY W/BAG SLVR 14FR (SET/KITS/TRAYS/PACK) ×3 IMPLANT
TRENDGUARD 450 HYBRID PRO PACK (MISCELLANEOUS) ×3
TROCAR BLADELESS OPT 5 100 (ENDOMECHANICALS) ×9 IMPLANT
WARMER LAPAROSCOPE (MISCELLANEOUS) ×3 IMPLANT

## 2019-03-31 NOTE — Anesthesia Postprocedure Evaluation (Signed)
Anesthesia Post Note  Patient: [name]  Procedure(s) Performed: LAPAROSCOPIC ASSISTED VAGINAL HYSTERECTOMY WITH SALPINGECTOMY (Bilateral Vagina ) CYSTOSCOPY (N/A Bladder)     Patient location during evaluation: PACU Anesthesia Type: General Level of consciousness: sedated, patient cooperative and oriented Pain control: pain improving. Vital Signs Assessment: post-procedure vital signs reviewed and stable Respiratory status: spontaneous breathing, nonlabored ventilation, respiratory function stable and patient connected to nasal cannula oxygen Cardiovascular status: blood pressure returned to baseline and stable Postop Assessment: no apparent nausea or vomiting Anesthetic complications: no    Last Vitals:  Vitals:   03/31/19 1147 03/31/19 1222  BP: (!) 141/67 131/79  Pulse: 98 92  Resp: 17 15  Temp: 36.9 C 36.5 C  SpO2: 99% 95%    Last Pain:  Vitals:   03/31/19 1222  TempSrc:   PainSc: 7                  Hobie Kohles,E. Haelee Bolen

## 2019-03-31 NOTE — Transfer of Care (Signed)
Immediate Anesthesia Transfer of Care Note  Patient: Yvonne Fisher  Procedure(s) Performed: Procedure(s) (LRB): LAPAROSCOPIC ASSISTED VAGINAL HYSTERECTOMY WITH SALPINGECTOMY (Bilateral) CYSTOSCOPY (N/A)  Patient Location: PACU  Anesthesia Type: General  Level of Consciousness: awake, oriented, sedated and patient cooperative  Airway & Oxygen Therapy: Patient Spontanous Breathing and Patient connected to face mask oxygen  Post-op Assessment: Report given to PACU RN and Post -op Vital signs reviewed and stable  Post vital signs: Reviewed and stable  Complications: No apparent anesthesia complications  Last Vitals:  Vitals Value Taken Time  BP 145/83 03/31/19 1030  Temp 36.4 C 03/31/19 1022  Pulse 87 03/31/19 1032  Resp 18 03/31/19 1032  SpO2 100 % 03/31/19 1032  Vitals shown include unvalidated device data.  Last Pain:  Vitals:   03/31/19 1022  TempSrc:   PainSc: Asleep      Patients Stated Pain Goal: 5 (03/31/19 0659)

## 2019-03-31 NOTE — Brief Op Note (Signed)
03/31/2019  10:07 AM  PATIENT:  Yvonne Fisher  43 y.o. female  PRE-OPERATIVE DIAGNOSIS:  Dysplasia of cervix  POST-OPERATIVE DIAGNOSIS:  Dysplasia of cervix  PROCEDURE:  Procedure(s): LAPAROSCOPIC ASSISTED VAGINAL HYSTERECTOMY WITH SALPINGECTOMY (Bilateral) CYSTOSCOPY (N/A)  SURGEON:  Surgeon(s) and Role:    * Bovard-Stuckert, Dayannara Pascal, MD - Primary    * Banga, Cecilia Worema, DO  ANESTHESIA:   local and general  EBL:  150 mL IVF and UOP per anesthesia  BLOOD ADMINISTERED:none  DRAINS: Urinary Catheter (Foley)   LOCAL MEDICATIONS USED:  MARCAINE     SPECIMEN:  Source of Specimen:  uterus and cervix, B fallopian tubes  DISPOSITION OF SPECIMEN:  PATHOLOGY  COUNTS:  YES  TOURNIQUET:  * No tourniquets in log *  DICTATION: .Other Dictation: Dictation Number [id number]  PLAN OF CARE: Admit for overnight observation  PATIENT DISPOSITION:  PACU - hemodynamically stable.   Delay start of Pharmacological VTE agent (>24hrs) due to surgical blood loss or risk of bleeding: not applicable

## 2019-03-31 NOTE — Progress Notes (Signed)
Day of Surgery Procedure(s) (LRB): LAPAROSCOPIC ASSISTED VAGINAL HYSTERECTOMY WITH SALPINGECTOMY (Bilateral) CYSTOSCOPY (N/A)  Subjective: Patient reports incisional pain, tolerating PO and no problems voiding.  Some rectal pressure (Pt points to lower back - likely positional).  Ambulating.      Objective: I have reviewed patient's vital signs, intake and output and medications.  General: alert and no distress Resp: clear to auscultation bilaterally Cardio: regular rate and rhythm GI: soft, non-tender; bowel sounds normal; no masses,  no organomegaly Extremities: extremities normal, atraumatic, no cyanosis or edema  Assessment: s/p Procedure(s): LAPAROSCOPIC ASSISTED VAGINAL HYSTERECTOMY WITH SALPINGECTOMY (Bilateral) CYSTOSCOPY (N/A): stable and progressing well  Plan: Encourage ambulation PO medication  Tolerating PO Some rectal pressure - trying comfort measures - heat, etc, T/C dulcolax   LOS: 0 days    Melissaann Dizdarevic Bovard-Stuckert 03/31/2019, 5:21 PM

## 2019-03-31 NOTE — Interval H&P Note (Signed)
History and Physical Interval Note:  03/31/2019 7:34 AM  Yvonne Fisher  has presented today for surgery, with the diagnosis of Dysplasia of cervix.  The various methods of treatment have been discussed with the patient and family. After consideration of risks, benefits and other options for treatment, the patient has consented to  Procedure(s): LAPAROSCOPIC ASSISTED VAGINAL HYSTERECTOMY WITH SALPINGECTOMY (Bilateral) CYSTOSCOPY (N/A) as a surgical intervention.  The patient's history has been reviewed, patient examined, no change in status, stable for surgery.  I have reviewed the patient's chart and labs.  Questions were answered to the patient's satisfaction.     Yecheskel Kurek Bovard-Stuckert

## 2019-03-31 NOTE — Anesthesia Procedure Notes (Signed)
Procedure Name: Intubation Date/Time: 03/31/2019 7:48 AM Performed by: Suan Halter, CRNA Pre-anesthesia Checklist: Patient identified, Emergency Drugs available, Suction available and Patient being monitored Patient Re-evaluated:Patient Re-evaluated prior to induction Oxygen Delivery Method: Circle system utilized Preoxygenation: Pre-oxygenation with 100% oxygen Induction Type: IV induction Ventilation: Mask ventilation without difficulty Laryngoscope Size: Mac and 3 Grade View: Grade I Tube type: Oral Tube size: 7.0 mm Number of attempts: 1 Airway Equipment and Method: Stylet and Oral airway Placement Confirmation: ETT inserted through vocal cords under direct vision,  positive ETCO2 and breath sounds checked- equal and bilateral Secured at: 22 cm Tube secured with: Tape Dental Injury: Teeth and Oropharynx as per pre-operative assessment

## 2019-04-01 ENCOUNTER — Encounter (HOSPITAL_BASED_OUTPATIENT_CLINIC_OR_DEPARTMENT_OTHER): Payer: Self-pay | Admitting: Obstetrics and Gynecology

## 2019-04-01 DIAGNOSIS — N871 Moderate cervical dysplasia: Secondary | ICD-10-CM | POA: Diagnosis not present

## 2019-04-01 LAB — BASIC METABOLIC PANEL
Anion gap: 9 (ref 5–15)
BUN: 12 mg/dL (ref 6–20)
CO2: 27 mmol/L (ref 22–32)
Calcium: 8.1 mg/dL — ABNORMAL LOW (ref 8.9–10.3)
Chloride: 99 mmol/L (ref 98–111)
Creatinine, Ser: 0.83 mg/dL (ref 0.44–1.00)
GFR calc Af Amer: >60 mL/min (ref >60)
GFR calc non Af Amer: >60 mL/min (ref >60)
Glucose, Bld: 130 mg/dL — ABNORMAL HIGH (ref 70–99)
Potassium: 3.5 mmol/L (ref 3.5–5.1)
Sodium: 135 mmol/L (ref 135–145)

## 2019-04-01 LAB — CBC
HCT: 34.7 % — ABNORMAL LOW (ref 36.0–46.0)
Hemoglobin: 11.3 g/dL — ABNORMAL LOW (ref 12.0–15.0)
MCH: 29.3 pg (ref 26.0–34.0)
MCHC: 32.6 g/dL (ref 30.0–36.0)
MCV: 89.9 fL (ref 80.0–100.0)
Platelets: 270 10*3/uL (ref 150–400)
RBC: 3.86 MIL/uL — ABNORMAL LOW (ref 3.87–5.11)
RDW: 13 % (ref 11.5–15.5)
WBC: 5.8 10*3/uL (ref 4.0–10.5)
nRBC: 0 % (ref 0.0–0.2)

## 2019-04-01 LAB — SURGICAL PATHOLOGY

## 2019-04-01 LAB — GLUCOSE, CAPILLARY: Glucose-Capillary: 119 mg/dL — ABNORMAL HIGH (ref 70–99)

## 2019-04-01 MED ORDER — IBUPROFEN 800 MG PO TABS: 800.0000 mg | ORAL_TABLET | Freq: Three times a day (TID) | ORAL | 1 refills | Status: DC | PRN

## 2019-04-01 MED ORDER — OXYCODONE-ACETAMINOPHEN 5-325 MG PO TABS
ORAL_TABLET | ORAL | Status: AC
  Filled 2019-04-01: qty 1

## 2019-04-01 MED ORDER — MENTHOL 3 MG MT LOZG
LOZENGE | OROMUCOSAL | Status: AC
  Filled 2019-04-01: qty 9

## 2019-04-01 MED ORDER — OXYCODONE-ACETAMINOPHEN 5-325 MG PO TABS: 1.0000 | ORAL_TABLET | Freq: Four times a day (QID) | ORAL | 0 refills | Status: DC | PRN

## 2019-04-01 NOTE — Progress Notes (Signed)
1 Day Post-Op Procedure(s) (LRB): LAPAROSCOPIC ASSISTED VAGINAL HYSTERECTOMY WITH SALPINGECTOMY (Bilateral) CYSTOSCOPY (N/A)  Subjective: Patient reports incisional pain, tolerating PO and no problems voiding.  Pain controlled.      Objective: I have reviewed patient's vital signs, intake and output, medications and labs.  General: alert and no distress Resp: clear to auscultation bilaterally Cardio: regular rate and rhythm GI: soft, non-tender; bowel sounds normal; no masses,  no organomegaly and incision: clean, dry and intact Extremities: extremities normal, atraumatic, no cyanosis or edema  Assessment: s/p Procedure(s): LAPAROSCOPIC ASSISTED VAGINAL HYSTERECTOMY WITH SALPINGECTOMY (Bilateral) CYSTOSCOPY (N/A): stable, progressing well, tolerating diet and ambulating, voiding and pain controlled  Plan: Advance diet Encourage ambulation Discharge home with Motrin and percocet.  F/U 2 and 6 weeks   LOS: 0 days    Fred Franzen Bovard-Stuckert 04/01/2019, 7:23 AM

## 2019-04-01 NOTE — Op Note (Signed)
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  PROCEDURE:  Laparoscopic assisted vaginal hysterectomy with bilateral salpingectomy and cystoscopy.  SURGEON:  Janyth Contes, MD  ASSISTANT:  Carlynn Purl, DO  ANESTHESIA:  Local and general.  ESTIMATED BLOOD LOSS:  Approximately 150 mL  INTRAVENOUS FLUIDS:  Per anesthesia.  URINE OUTPUT:  Per anesthesia.  COMPLICATIONS:  Procedure was complicated by the patient's habitus and redundant vaginal tissue.  PATHOLOGY:  Uterus, cervix, bilateral fallopian tubes.  DESCRIPTION OF PROCEDURE:  After informed consent was reviewed with the patient including risks, benefits and alternatives of the surgical procedure, she was transported to the operating room and placed on the table in supine position.  General  anesthesia was induced and found to be adequate.  She was then placed in the Yellofin stirrups, prepped and draped in the normal sterile fashion.  After an appropriate timeout was performed,  using an open-sided speculum and single-tooth tenaculum, a  Hulka manipulator was placed on her cervix.  Gloves and gowns were changed and attention was turned to the abdominal portion of the case and an approximately 1 cm infraumbilical incision was made using the Veress needle.  Pneumoperitoneum was obtained  with an opening pressure of 5 mm.  After pneumoperitoneum had been obtained using direct entry, the trocar was placed.  Accessory ports were placed on both  the right and left under direct visualization.  Trendelenburg was used to appreciate the  patient having a normal uterus, tubes and ovaries and a normal appendix was also visualized.  Her uterus was manipulated w hulka tenaculum.  The left fimbria were grasped and the tube was excised using Harmonic scalpel to the level of the cornu.  At this time, the  cardinal ligaments were incised to the level of the uterine arteries, where a bladder flap was created.  Attention was then turned to the right side, which in a similar fashion the tube was grasped at the fimbriated end and excised to the level of the  cornu.  The cardinal ligaments were then separated to the level of the uterine arteries.  A bladder flap was created to meet with the bladder flap created from the left side.  Bleeding was controlled.    Attention was turned to the vaginal portion of the case.  The patient was removed from some of the Trendelenburg.  Using heavy weighted speculum and Sims retractors, her uterus was visualized and injected with vasopressin and grasped with the Jacobson  tenaculum.  The cervix was circumscribed with Bovie cautery and the anterior cul-de-sac was entered.  A retractor was placed.  The posterior cul-de-sac was entered sharply.  A banana speculum was placed.  The uterosacral ligaments were ligated  bilaterally and held.  Additional pedicles were created meeting up with what had been separated laparoscopically.  The uterus and cervix were then delivered vaginally and sent to pathology.  The pedicles were inspected and found to be hemostatic.  The  uterosacral ligaments were plicated and the held sutures were tied together.  The vaginal mucosa was reapproximated with 2-0 Vicryl in a running locked fashion.  As the cuff was starting to be closed,  the patient was given fluorescein.  A cystoscope was  introduced into her bladder.  The bladder was dilated with 200 mL of saline and jets were seen bilaterally from the  ureteral orifices.  The Foley catheter was then replaced in her bladder.  Gloves and gowns changed.    Attention was returned to the abdominal portion of the case.  The pedicles were noted to be hemostatic.  Irrigation was performed.  The pneumoperitoneum was evacuated.  The 5 mm ports were all closed with a subcuticular suture and Dermabond.  Sponge, lap  and needle counts were correct x2 per the operating staff.  The patient tolerated the procedure well and was transported in stable condition to the PACU.  JN/NUANCE  D:03/31/2019 T:04/01/2019 JOB:[id number]

## 2019-04-01 NOTE — Discharge Summary (Signed)
Physician Discharge Summary  Patient ID: Yvonne Fisher MRN: [id number] DOB/AGE: 1976/03/18 43 y.o.  Admit date: 03/31/2019 Discharge date: 04/01/2019  Admission Diagnoses: CIN 3  Discharge Diagnoses:  Principal Problem:   S/P laparoscopic assisted vaginal hysterectomy (LAVH) B salpingectomy, cystoscopy  Discharged Condition: good  Hospital Course: Admitted for surgery 12/3, underwent w/o complication.  Ambulating voiding, pain controlled.  Labs stable.  D/C to home POD#1  Consults: None  Significant Diagnostic Studies: labs: CBC, BMP  Treatments: IV hydration and surgery: LAVH/BS/cysto  Discharge Exam: Blood pressure 130/68, pulse 76, temperature 98.6 F (37 C), resp. rate 18, height '5\' 9"'$  (1.753 m), weight 100.7 kg, last menstrual period 03/16/2019, SpO2 96 %. General appearance: alert and no distress Resp: clear to auscultation bilaterally Cardio: regular rate and rhythm GI: soft, non-tender; bowel sounds normal; no masses,  no organomegaly Incision/Wound:C/D/I  Disposition: Discharge disposition: 01-Home or Self Care       Discharge Instructions    Call MD for:  persistant nausea and vomiting   Complete by: As directed    Call MD for:  redness, tenderness, or signs of infection (pain, swelling, redness, odor or green/yellow discharge around incision site)   Complete by: As directed    Call MD for:  severe uncontrolled pain   Complete by: As directed    Diet - low sodium heart healthy   Complete by: As directed    Discharge instructions   Complete by: As directed    Call [phone] with questions or problems   Driving Restrictions   Complete by: As directed    While taking strong pain medicine   Increase activity slowly   Complete by: As directed    Lifting restrictions   Complete by: As directed    No greater than 10-15lbs for 6 weeks   May shower / Bathe   Complete by: As directed    May walk up steps   Complete by: As directed    Sexual Activity  Restrictions   Complete by: As directed    Pelvic rest - no douching, tampons or sex for 6 weeks     Allergies as of 04/01/2019      Reactions   Adhesive [tape] Other (See Comments)   TEARS SKIN   Prevacid [lansoprazole] Hives      Medication List    TAKE these medications   Accu-Chek Guide test strip Generic drug: glucose blood USE TO CHECK BLOOD SUGAR QID   amoxicillin 500 MG tablet Commonly known as: AMOXIL Take 500 mg by mouth 2 (two) times daily. For 7 days-started on 03/28/2019   blood glucose meter kit and supplies Dispense based on patient and insurance preference. Use up to four times daily as directed. (FOR ICD-10 E10.9, E11.9).   glipiZIDE 5 MG tablet Commonly known as: GLUCOTROL Take 1 tablet (5 mg total) by mouth 2 (two) times daily before a meal.   ibuprofen 800 MG tablet Commonly known as: ADVIL Take 1 tablet (800 mg total) by mouth every 8 (eight) hours as needed (mild pain).   Lancets Misc Check sugar once daily dx DMII   lisinopril-hydrochlorothiazide 20-25 MG tablet Commonly known as: ZESTORETIC Take 1 tablet by mouth daily. What changed: when to take this   metFORMIN 500 MG tablet Commonly known as: Glucophage Take 1 tablet (500 mg total) by mouth 2 (two) times daily with a meal.   oxyCODONE-acetaminophen 5-325 MG tablet Commonly known as: PERCOCET/ROXICET Take 1-2 tablets by mouth every 6 (six) hours as  needed for severe pain (moderate to severe pain (when tolerating fluids)).      Follow-up Information    Bovard-Stuckert, Yazir Koerber, MD. Schedule an appointment as soon as possible for a visit in 2 week(s).   Specialty: Obstetrics and Gynecology Why: for incision check and 6 weeks for full postop check Contact information: [address] [place] [address] [phone]           Signed: Janyth Contes 04/01/2019, 8:36 AM

## 2019-04-06 ENCOUNTER — Telehealth: Payer: Self-pay | Admitting: Adult Health Nurse Practitioner

## 2019-04-06 DIAGNOSIS — E119 Type 2 diabetes mellitus without complications: Secondary | ICD-10-CM

## 2019-04-06 DIAGNOSIS — I10 Essential (primary) hypertension: Secondary | ICD-10-CM

## 2019-04-06 NOTE — Telephone Encounter (Signed)
Alinda Sierras with  [facility] is calling to let us know that that they faxed over request earlier this am / Patients insurance will only allow a 90 supply at the  pharmacy . The mediciine is metFORMIN (GLUCOPHAGE) 500 MG tablet [phone]  Fax [phone]

## 2019-04-07 ENCOUNTER — Telehealth: Payer: Self-pay | Admitting: Adult Health Nurse Practitioner

## 2019-04-07 DIAGNOSIS — I10 Essential (primary) hypertension: Secondary | ICD-10-CM

## 2019-04-07 DIAGNOSIS — E119 Type 2 diabetes mellitus without complications: Secondary | ICD-10-CM

## 2019-04-07 MED ORDER — METFORMIN HCL 500 MG PO TABS: 500.0000 mg | ORAL_TABLET | Freq: Two times a day (BID) | ORAL | 0 refills | Status: DC

## 2019-04-07 NOTE — Telephone Encounter (Signed)
Refilled medication for 90 days.

## 2019-04-07 NOTE — Telephone Encounter (Signed)
metFORMIN (GLUCOPHAGE) 500 MG tablet  Pt called regarding her medication . Says that her insurance will not cover unless its 90 day supplies  Please advise if Possible or approved

## 2019-04-12 ENCOUNTER — Other Ambulatory Visit: Payer: Self-pay | Admitting: *Deleted

## 2019-04-12 ENCOUNTER — Telehealth: Payer: Self-pay | Admitting: Adult Health Nurse Practitioner

## 2019-04-12 DIAGNOSIS — I10 Essential (primary) hypertension: Secondary | ICD-10-CM

## 2019-04-12 DIAGNOSIS — E119 Type 2 diabetes mellitus without complications: Secondary | ICD-10-CM

## 2019-04-12 MED ORDER — GLIPIZIDE 5 MG PO TABS: 5.0000 mg | ORAL_TABLET | Freq: Two times a day (BID) | ORAL | 0 refills | Status: DC

## 2019-04-12 NOTE — Telephone Encounter (Signed)
Patient called in to let us know she needs refill on meds and ALL meds have to be 90 day supply.the patient . Patient is in need of glipiZIDE (GLUCOTROL) 5 MG tablet and lisinopril-hydrochlorothiazide (ZESTORETIC) 20-25 MG tablet      Please advise   FR

## 2019-04-12 NOTE — Telephone Encounter (Signed)
Pharmacy called to advise pt's insurance needs a 90 day of her meds to fill.  Please send 90 day of  glipiZIDE (GLUCOTROL) 5 MG tablet  [facility] [phone] - [address], [place] AT [address] Phone:  [phone]  Fax:  [phone]

## 2019-04-13 MED ORDER — LISINOPRIL-HYDROCHLOROTHIAZIDE 20-25 MG PO TABS: 1.0000 | ORAL_TABLET | Freq: Every day | ORAL | 1 refills | Status: DC

## 2019-04-13 MED ORDER — GLIPIZIDE 5 MG PO TABS: 5.0000 mg | ORAL_TABLET | Freq: Two times a day (BID) | ORAL | 1 refills | Status: DC

## 2019-04-13 NOTE — Telephone Encounter (Signed)
Rx sent to pharmacy   

## 2019-05-10 ENCOUNTER — Encounter: Payer: Self-pay | Admitting: Adult Health Nurse Practitioner

## 2019-05-10 NOTE — Progress Notes (Signed)
Hx of abnormal pap: Yes(Notes, 1998 cryo; 01/2014 ASCUS HR HPV pos: 09/2013 HGSIL/HR HPV pos/AGUS-failed F/U 10/19 HGSIL HR HPV POS  Hx of cervical dysplasia: Yes(Notes: CIN 1 on colpo 11/28/2013, 6/20 CIN 3)  HSV Type II Ovarian Cyst  Laparoscopically assisted vaginal hysterectomy (surg) 04/01/2019 LEEP/[facility] Biopsy 2012 Cryotherapy of the Cervix 1998

## 2019-08-12 ENCOUNTER — Emergency Department (HOSPITAL_COMMUNITY)
Admission: EM | Admit: 2019-08-12 | Discharge: 2019-08-13 | Disposition: A | Discharge status: Home / Self Care | Payer: 59 | Attending: Emergency Medicine | Admitting: Emergency Medicine

## 2019-08-12 ENCOUNTER — Emergency Department (HOSPITAL_COMMUNITY): Payer: 59

## 2019-08-12 ENCOUNTER — Encounter (HOSPITAL_COMMUNITY): Payer: Self-pay | Admitting: Emergency Medicine

## 2019-08-12 ENCOUNTER — Other Ambulatory Visit: Payer: Self-pay

## 2019-08-12 DIAGNOSIS — I10 Essential (primary) hypertension: Secondary | ICD-10-CM | POA: Diagnosis not present

## 2019-08-12 DIAGNOSIS — Z79899 Other long term (current) drug therapy: Secondary | ICD-10-CM | POA: Diagnosis not present

## 2019-08-12 DIAGNOSIS — R251 Tremor, unspecified: Secondary | ICD-10-CM | POA: Diagnosis not present

## 2019-08-12 DIAGNOSIS — Z7984 Long term (current) use of oral hypoglycemic drugs: Secondary | ICD-10-CM | POA: Diagnosis not present

## 2019-08-12 DIAGNOSIS — E119 Type 2 diabetes mellitus without complications: Secondary | ICD-10-CM | POA: Insufficient documentation

## 2019-08-12 DIAGNOSIS — R4781 Slurred speech: Secondary | ICD-10-CM | POA: Insufficient documentation

## 2019-08-12 DIAGNOSIS — F449 Dissociative and conversion disorder, unspecified: Secondary | ICD-10-CM

## 2019-08-12 DIAGNOSIS — R4701 Aphasia: Secondary | ICD-10-CM | POA: Diagnosis not present

## 2019-08-12 LAB — COMPREHENSIVE METABOLIC PANEL
ALT: 36 U/L (ref 0–44)
AST: 28 U/L (ref 15–41)
Albumin: 3.9 g/dL (ref 3.5–5.0)
Alkaline Phosphatase: 84 U/L (ref 38–126)
Anion gap: 12 (ref 5–15)
BUN: 13 mg/dL (ref 6–20)
CO2: 22 mmol/L (ref 22–32)
Calcium: 8.9 mg/dL (ref 8.9–10.3)
Chloride: 99 mmol/L (ref 98–111)
Creatinine, Ser: 0.86 mg/dL (ref 0.44–1.00)
GFR calc Af Amer: >60 mL/min (ref >60)
GFR calc non Af Amer: >60 mL/min (ref >60)
Glucose, Bld: 183 mg/dL — ABNORMAL HIGH (ref 70–99)
Potassium: 3.3 mmol/L — ABNORMAL LOW (ref 3.5–5.1)
Sodium: 133 mmol/L — ABNORMAL LOW (ref 135–145)
Total Bilirubin: 0.5 mg/dL (ref 0.3–1.2)
Total Protein: 7.2 g/dL (ref 6.5–8.1)

## 2019-08-12 LAB — DIFFERENTIAL
Abs Immature Granulocytes: 0.02 10*3/uL (ref 0.00–0.07)
Basophils Absolute: 0.1 10*3/uL (ref 0.0–0.1)
Basophils Relative: 1 %
Eosinophils Absolute: 0.2 10*3/uL (ref 0.0–0.5)
Eosinophils Relative: 2 %
Immature Granulocytes: 0 %
Lymphocytes Relative: 49 %
Lymphs Abs: 5 10*3/uL — ABNORMAL HIGH (ref 0.7–4.0)
Monocytes Absolute: 0.9 10*3/uL (ref 0.1–1.0)
Monocytes Relative: 9 %
Neutro Abs: 3.9 10*3/uL (ref 1.7–7.7)
Neutrophils Relative %: 39 %

## 2019-08-12 LAB — CBC
HCT: 44.6 % (ref 36.0–46.0)
Hemoglobin: 14.1 g/dL (ref 12.0–15.0)
MCH: 28.8 pg (ref 26.0–34.0)
MCHC: 31.6 g/dL (ref 30.0–36.0)
MCV: 91 fL (ref 80.0–100.0)
Platelets: 324 10*3/uL (ref 150–400)
RBC: 4.9 MIL/uL (ref 3.87–5.11)
RDW: 13.3 % (ref 11.5–15.5)
WBC: 10 10*3/uL (ref 4.0–10.5)
nRBC: 0 % (ref 0.0–0.2)

## 2019-08-12 LAB — I-STAT CHEM 8, ED
BUN: 13 mg/dL (ref 6–20)
Calcium, Ion: 1.03 mmol/L — ABNORMAL LOW (ref 1.15–1.40)
Chloride: 104 mmol/L (ref 98–111)
Creatinine, Ser: 0.6 mg/dL (ref 0.44–1.00)
Glucose, Bld: 175 mg/dL — ABNORMAL HIGH (ref 70–99)
HCT: 43 % (ref 36.0–46.0)
Hemoglobin: 14.6 g/dL (ref 12.0–15.0)
Potassium: 3.3 mmol/L — ABNORMAL LOW (ref 3.5–5.1)
Sodium: 137 mmol/L (ref 135–145)
TCO2: 24 mmol/L (ref 22–32)

## 2019-08-12 LAB — I-STAT BETA HCG BLOOD, ED (MC, WL, AP ONLY): I-stat hCG, quantitative: <5 m[IU]/mL (ref <5)

## 2019-08-12 LAB — CBG MONITORING, ED: Glucose-Capillary: 160 mg/dL — ABNORMAL HIGH (ref 70–99)

## 2019-08-12 LAB — PROTIME-INR
INR: 0.9 (ref 0.8–1.2)
Prothrombin Time: 11.6 seconds (ref 11.4–15.2)

## 2019-08-12 LAB — APTT: aPTT: 27 seconds (ref 24–36)

## 2019-08-12 MED ORDER — SODIUM CHLORIDE 0.9% FLUSH
3.0000 mL | Freq: Once | INTRAVENOUS | Status: DC
Start: 2019-08-12 — End: 2019-08-13

## 2019-08-12 NOTE — Code Documentation (Signed)
Responded to Code Stroke called at [place] for aphasia and garbled/slurred speech, [id number]. Pt arrived at 2001 with shaking/tremors. Pt's was also stuttering her speech. NIH-2, CT head negative. Code Stroke cancelled at 2030 . Plan to work up for seizures.

## 2019-08-12 NOTE — ED Provider Notes (Signed)
[facility] EMERGENCY DEPARTMENT Provider Note   CSN: [id number] Arrival date & time: 08/12/19  2001  An emergency department physician performed an initial assessment on this suspected stroke patient at 2002.  History Chief Complaint  Patient presents with  . Code Stroke    Yvonne Fisher is a 44 y.o. female.  HPI Patient presents to the emergency department with an acute onset of trouble speaking and stuttering.  The patient states that she was also shaking.  The patient states that she has had several stressful events occurred today.  Patient states that she was at home and she noticed that her blood sugar got low.  The patient states she ate after this and then she started having these issues with her speech.  The patient denies chest pain, shortness of breath, headache,blurred vision, neck pain, fever, cough, weakness, numbness, dizziness, anorexia, edema, abdominal pain, nausea, vomiting, diarrhea, rash, back pain, dysuria, hematemesis, bloody stool, near syncope, or syncope.    Past Medical History:  Diagnosis Date  . Arthritis    right elbow   . Diabetes ([place])   . GERD (gastroesophageal reflux disease)   . Gestational diabetes    diet controlled  . Hidradenitis 11/2011   left axilla  . Pregnancy induced hypertension 2015   "still keeping eye on it" (01/18/2014)  . Rash 12/01/2011   bilat. axilla  . S/P laparoscopic assisted vaginal hysterectomy (LAVH) 03/31/2019   Also B salpingectomy, cystoscopy  . Thyroid nodule     Patient Active Problem List   Diagnosis Date Noted  . S/P laparoscopic assisted vaginal hysterectomy (LAVH) 03/31/2019  . Essential hypertension 07/23/2016  . Type 2 diabetes mellitus without complication, without long-term current use of insulin ([place]) 03/26/2016  . Class 2 severe obesity due to excess calories with serious comorbidity and body mass index (BMI) of 35.0 to 35.9 in adult ([place]) 03/26/2016  . Thyroid nodule 01/18/2014  .  Hidradenitis axillaris 11/21/2011    Past Surgical History:  Procedure Laterality Date  . ABDOMINAL HYSTERECTOMY    . CHOLECYSTECTOMY  04/15/2011   Procedure: LAPAROSCOPIC CHOLECYSTECTOMY WITH INTRAOPERATIVE CHOLANGIOGRAM;  Surgeon: Adin Hector, MD;  [facility];  Service: General;  Laterality: N/A;  laparoscopic cholecystectomy with cholangiogram  . CYSTOSCOPY N/A 03/31/2019   Procedure: CYSTOSCOPY;  Surgeon: Janyth Contes, MD;  Location: [facility];  Service: Gynecology;  Laterality: N/A;  . ELBOW ARTHROSCOPY  1997   right elbow  . HYDRADENITIS EXCISION  12/05/2011   Procedure: EXCISION HYDRADENITIS AXILLA;  Surgeon: Harl Bowie, MD;  Location: [facility];  Service: General;  Laterality: Left;  wide excision hidradenitis left axilla  . LAPAROSCOPIC VAGINAL HYSTERECTOMY WITH SALPINGECTOMY Bilateral 03/31/2019   Procedure: LAPAROSCOPIC ASSISTED VAGINAL HYSTERECTOMY WITH SALPINGECTOMY;  Surgeon: Janyth Contes, MD;  Location: [facility];  Service: Gynecology;  Laterality: Bilateral;  . THYROID LOBECTOMY Left 01/18/2014  . THYROIDECTOMY Left 01/18/2014   Procedure: LEFT THYROID LOBECTOMY;  Surgeon: Coralie Keens, MD;  Location: [facility];  Service: General;  Laterality: Left;     OB History    Gravida  4   Para  2   Term  2   Preterm  0   AB  2   Living  2     SAB  2   TAB  0   Ectopic  0   Multiple  0   Live Births  2  Family History  Problem Relation Age of Onset  . Hypertension Mother   . Multiple sclerosis Mother   . Diabetes Father   . Heart disease Father 38       AMI    Social History   Tobacco Use  . Smoking status: Never Smoker  . Smokeless tobacco: Never Used  Substance Use Topics  . Alcohol use: Yes    Comment: 01/18/2014 "might have a drink @ birthday party or holidays; not all the time"  . Drug use: No    Home Medications Prior to Admission medications     Medication Sig Start Date End Date Taking? Authorizing Provider  glipiZIDE (GLUCOTROL) 5 MG tablet Take 1 tablet (5 mg total) by mouth 2 (two) times daily before a meal. 04/13/19  Yes Wendall Mola, NP  lisinopril-hydrochlorothiazide (ZESTORETIC) 20-25 MG tablet Take 1 tablet by mouth at bedtime. 04/13/19  Yes Wendall Mola, NP  metFORMIN (GLUCOPHAGE) 500 MG tablet Take 1 tablet (500 mg total) by mouth 2 (two) times daily with a meal. 04/07/19  Yes Wendall Mola, NP  ACCU-CHEK GUIDE test strip USE TO CHECK BLOOD SUGAR QID 03/17/19   Wendall Mola, NP  blood glucose meter kit and supplies Dispense based on patient and insurance preference. Use up to four times daily as directed. (FOR ICD-10 E10.9, E11.9). 01/27/19   Wendall Mola, NP  ibuprofen (ADVIL) 800 MG tablet Take 1 tablet (800 mg total) by mouth every 8 (eight) hours as needed (mild pain). Patient not taking: Reported on 08/12/2019 04/01/19   Janyth Contes, MD  Lancets MISC Check sugar once daily dx DMII 03/17/19   Wendall Mola, NP  oxyCODONE-acetaminophen (PERCOCET/ROXICET) 5-325 MG tablet Take 1-2 tablets by mouth every 6 (six) hours as needed for severe pain (moderate to severe pain (when tolerating fluids)). Patient not taking: Reported on 08/12/2019 04/01/19   Janyth Contes, MD    Allergies    Adhesive [tape] and Prevacid [lansoprazole]  Review of Systems   Review of Systems All other systems negative except as documented in the HPI. All pertinent positives and negatives as reviewed in the HPI. Physical Exam Updated Vital Signs BP (!) 156/71   Pulse 98   Temp 98.1 F (36.7 C) (Oral)   Resp (!) 22   Ht '5\' 9"'  (1.753 m)   Wt 108.3 kg   SpO2 99%   BMI 35.26 kg/m   Physical Exam Vitals and nursing note reviewed.  Constitutional:      General: She is not in acute distress.    Appearance: She is well-developed.  HENT:     Head: Normocephalic and atraumatic.  Eyes:      Pupils: Pupils are equal, round, and reactive to light.  Cardiovascular:     Rate and Rhythm: Normal rate and regular rhythm.     Heart sounds: Normal heart sounds. No murmur. No friction rub. No gallop.   Pulmonary:     Effort: Pulmonary effort is normal. No respiratory distress.     Breath sounds: Normal breath sounds. No wheezing.  Abdominal:     General: Bowel sounds are normal. There is no distension.     Palpations: Abdomen is soft.     Tenderness: There is no abdominal tenderness.  Musculoskeletal:     Cervical back: Normal range of motion and neck supple.  Skin:    General: Skin is warm and dry.     Capillary Refill: Capillary refill takes less than 2 seconds.  Findings: No erythema or rash.  Neurological:     Mental Status: She is alert and oriented to person, place, and time.     Motor: No abnormal muscle tone.     Coordination: Coordination normal.  Psychiatric:        Behavior: Behavior normal.     ED Results / Procedures / Treatments   Labs (all labs ordered are listed, but only abnormal results are displayed) Labs Reviewed  DIFFERENTIAL - Abnormal; Notable for the following components:      Result Value   Lymphs Abs 5.0 (*)    All other components within normal limits  COMPREHENSIVE METABOLIC PANEL - Abnormal; Notable for the following components:   Sodium 133 (*)    Potassium 3.3 (*)    Glucose, Bld 183 (*)    All other components within normal limits  I-STAT CHEM 8, ED - Abnormal; Notable for the following components:   Potassium 3.3 (*)    Glucose, Bld 175 (*)    Calcium, Ion 1.03 (*)    All other components within normal limits  CBG MONITORING, ED - Abnormal; Notable for the following components:   Glucose-Capillary 160 (*)    All other components within normal limits  PROTIME-INR  APTT  CBC  I-STAT BETA HCG BLOOD, ED ([facility], [facility], [facility] ONLY)    EKG EKG Interpretation  Date/Time:  Friday August 12 2019 20:24:06 EDT Ventricular Rate:  107 PR  Interval:    QRS Duration: 89 QT Interval:  351 QTC Calculation: 469 R Axis:   65 Text Interpretation: Sinus tachycardia Borderline T abnormalities, anterior leads No STEMI, no sig changes from Sept 2020 ecg Confirmed by Octaviano Glow [phone]) on 08/12/2019 9:18:32 PM   Radiology CT HEAD CODE STROKE WO CONTRAST  Result Date: 08/12/2019 CLINICAL DATA:  Code stroke. Slurred speech. Last known well 1.5 hours ago. EXAM: CT HEAD WITHOUT CONTRAST TECHNIQUE: Contiguous axial images were obtained from the base of the skull through the vertex without intravenous contrast. COMPARISON:  None. FINDINGS: Brain: No acute infarct, hemorrhage, or mass lesion is present. No significant white matter lesions are present. Basal ganglia and insular ribbon is normal bilaterally. No acute focal cortical abnormality is present. The ventricles are of normal size. No significant extraaxial fluid collection is present. The brainstem and cerebellum are within normal limits. Vascular: No hyperdense vessel or unexpected calcification. Skull: Calvarium is intact. No focal lytic or blastic lesions are present. No significant extracranial soft tissue lesion is present. Sinuses/Orbits: The paranasal sinuses and mastoid air cells are clear. The globes and orbits are within normal limits. ASPECTS [facility] Stroke Program Early CT Score) - Ganglionic level infarction (caudate, lentiform nuclei, internal capsule, insula, M1-M3 cortex): 7/7 - Supraganglionic infarction (M4-M6 cortex): 3/3 Total score (0-10 with 10 being normal): 10/10 IMPRESSION: 1. Normal CT of the head. 2. ASPECTS is 10/10 The above was relayed via text pager to Dr. Cheral Marker on 08/12/2019 at 20:20 . Electronically Signed   By: San Morelle M.D.   On: 08/12/2019 20:20    Procedures Procedures (including critical care time)  Medications Ordered in ED Medications  sodium chloride flush (NS) 0.9 % injection 3 mL (has no administration in time range)    ED Course    I have reviewed the triage vital signs and the nursing notes.  Pertinent labs & imaging results that were available during my care of the patient were reviewed by me and considered in my medical decision making (see chart for details).  MDM Rules/Calculators/A&P                      Patient initially came in as a code stroke and was seen by neurology.  The patient's presentation presented to them as someone is having known strokelike symptoms.  The patient had a CT scan that did not show any abnormality at this time.  The patient is back at her baseline and she is eating and drinking without difficulties.  The patient is ambulated without difficulty as well.  Patient is advised to follow-up with her primary care doctor.  Told to return for any worsening in her condition. Final Clinical Impression(s) / ED Diagnoses Final diagnoses:  None    Rx / DC Orders ED Discharge Orders    None       Dalia Heading, Hershal Coria 08/12/19 2330    Wyvonnia Dusky, MD 08/13/19 1312

## 2019-08-12 NOTE — Discharge Instructions (Signed)
Return here as needed.  Your primary doctor for a recheck  If you have any changes or worsening in your condition you will need to be reevaluated.

## 2019-08-12 NOTE — ED Triage Notes (Signed)
Pt BIB [facility] r/t sudden onset aphasia and garbled speech.PT was tremoring and stuttering upon arrival. No unilateral weakness or facial droop. No drift noted.
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
## 2019-08-13 NOTE — ED Notes (Signed)
Patient verbalizes understanding of discharge instructions. Opportunity for questioning and answers were provided. Armband removed by staff, pt discharged from ED ambulatory.   

## 2019-10-11 LAB — HM DIABETES EYE EXAM

## 2019-11-07 ENCOUNTER — Encounter: Payer: Self-pay | Admitting: Adult Health Nurse Practitioner

## 2019-11-07 LAB — HM DIABETES EYE EXAM

## 2019-12-06 ENCOUNTER — Telehealth: Payer: Self-pay | Admitting: Family Medicine

## 2019-12-06 DIAGNOSIS — E119 Type 2 diabetes mellitus without complications: Secondary | ICD-10-CM

## 2019-12-06 DIAGNOSIS — I10 Essential (primary) hypertension: Secondary | ICD-10-CM

## 2019-12-06 NOTE — Telephone Encounter (Signed)
What is the name of the medication? metFORMIN (GLUCOPHAGE) 500 MG tablet [[id number]]    Have you contacted your pharmacy to request a refill? Yes they are trying to reach [place] for a refill.  Which pharmacy would you like this sent to? Pharmacy  [facility] [phone] - [place], [place]  [address] [place] [address]  Phone:  [phone] Fax:  [phone]  DEA #:  [id number]     Patient notified that their request is being sent to the clinical staff for review and that they should receive a call once it is complete. If they do not receive a call within 72 hours they can check with their pharmacy or our office.

## 2019-12-07 MED ORDER — METFORMIN HCL 500 MG PO TABS: 500.0000 mg | ORAL_TABLET | Freq: Two times a day (BID) | ORAL | 0 refills | Status: DC

## 2019-12-07 NOTE — Telephone Encounter (Signed)
Pt is calling back and she is out of Metformin . Pharmacy gave her 6 pills not enough , please give courtesy refill ot has TOC with Orland Mustard 02/15/2020

## 2020-02-15 ENCOUNTER — Ambulatory Visit (INDEPENDENT_AMBULATORY_CARE_PROVIDER_SITE_OTHER): Payer: 59 | Admitting: Registered Nurse

## 2020-02-15 ENCOUNTER — Encounter: Payer: Self-pay | Admitting: Registered Nurse

## 2020-02-15 ENCOUNTER — Other Ambulatory Visit: Payer: Self-pay

## 2020-02-15 VITALS — BP 171/101 | HR 97 | Temp 98.0°F | Resp 18 | Ht 69.0 in | Wt 233.4 lb

## 2020-02-15 DIAGNOSIS — Z23 Encounter for immunization: Secondary | ICD-10-CM

## 2020-02-15 DIAGNOSIS — E119 Type 2 diabetes mellitus without complications: Secondary | ICD-10-CM

## 2020-02-15 DIAGNOSIS — I10 Essential (primary) hypertension: Secondary | ICD-10-CM | POA: Diagnosis not present

## 2020-02-15 LAB — POCT GLYCOSYLATED HEMOGLOBIN (HGB A1C): Hemoglobin A1C: 7.8 % — AB (ref 4.0–5.6)

## 2020-02-15 MED ORDER — LOSARTAN POTASSIUM 100 MG PO TABS: 100.0000 mg | ORAL_TABLET | Freq: Every day | ORAL | 3 refills | Status: AC

## 2020-02-15 MED ORDER — DAPAGLIFLOZIN-SAXAGLIPTIN 10-5 MG PO TABS: 1.0000 | ORAL_TABLET | Freq: Every day | ORAL | 1 refills | Status: DC

## 2020-02-15 NOTE — Patient Instructions (Signed)
° ° ° °  If you have lab work done today you will be contacted with your lab results within the next 2 weeks.  If you have not heard from us then please contact us. The fastest way to get your results is to register for My Chart. ° ° °IF you received an x-ray today, you will receive an invoice from [facility]. Please contact [facility] at [phone] with questions or concerns regarding your invoice.  ° °IF you received labwork today, you will receive an invoice from [facility]. Please contact [facility] at [phone] with questions or concerns regarding your invoice.  ° °Our billing staff will not be able to assist you with questions regarding bills from these companies. ° °You will be contacted with the lab results as soon as they are available. The fastest way to get your results is to activate your My Chart account. Instructions are located on the last page of this paperwork. If you have not heard from us regarding the results in 2 weeks, please contact this office. °  ° ° ° °

## 2020-03-01 ENCOUNTER — Other Ambulatory Visit: Payer: Self-pay | Admitting: Registered Nurse

## 2020-03-01 DIAGNOSIS — E119 Type 2 diabetes mellitus without complications: Secondary | ICD-10-CM

## 2020-03-01 DIAGNOSIS — I10 Essential (primary) hypertension: Secondary | ICD-10-CM

## 2020-05-08 ENCOUNTER — Encounter: Payer: Self-pay | Admitting: Registered Nurse

## 2020-05-08 NOTE — Progress Notes (Signed)
Established Patient Office Visit  Subjective:  Patient ID: Yvonne Fisher, female    DOB: 1975/10/30  Age: 45 y.o. MRN: [id number]  CC:  Chief Complaint  Patient presents with  . Transitions Of Care    Patient states she here for TOC and Medication refills.    HPI [name] presents for visit to est care Formerly under Jens Som, NP  T2dm Last A1c: 7.9 on 03/29/19 Currently taking: glipizide 17m PO bid ac, dapagliflozin-saxagliptin 10-578mPO qd, metformin 50032mO bid ac. No new complications Reports good compliance with medications Diet has been consistent Exercise habits have been limited.  Hypertension: Patient Currently taking: lisinopril-hctz 20-87m30m qd and losartan 100mg51mqd. Good effect. No AEs. Denies CV symptoms including: chest pain, shob, doe, headache, visual changes, fatigue, claudication, and dependent edema.   Previous readings and labs: BP Readings from Last 3 Encounters:  02/15/20 (!) 171/101  08/12/19 (!) 155/74  04/01/19 (!) 141/77   Lab Results  Component Value Date   CREATININE 0.60 08/12/2019    Otherwise Histories reviewed with patient, updated as warranted Reviewed health maintenance and discussed with patient   Past Medical History:  Diagnosis Date  . Arthritis    right elbow   . Diabetes (HCC) [place] GERD (gastroesophageal reflux disease)   . Gestational diabetes    diet controlled  . Hidradenitis 11/2011   left axilla  . Pregnancy induced hypertension 2015   "still keeping eye on it" (01/18/2014)  . Rash 12/01/2011   bilat. axilla  . S/P laparoscopic assisted vaginal hysterectomy (LAVH) 03/31/2019   Also B salpingectomy, cystoscopy  . Thyroid nodule     Past Surgical History:  Procedure Laterality Date  . CHOLECYSTECTOMY  04/15/2011   Procedure: LAPAROSCOPIC CHOLECYSTECTOMY WITH INTRAOPERATIVE CHOLANGIOGRAM;  Surgeon: HaywoAdin Hector  [facility]: [facility];  Service: General;  Laterality: N/A;  laparoscopic  cholecystectomy with cholangiogram  . CYSTOSCOPY N/A 03/31/2019   Procedure: CYSTOSCOPY;  Surgeon: BovarJanyth Contes  [facility];  Laterality: N/A;  . ELBOW ARTHROSCOPY  1997   right elbow  . HYDRADENITIS EXCISION  12/05/2011   Procedure: EXCISION HYDRADENITIS AXILLA;  Surgeon: DouglHarl Bowie  [facility];  Laterality: Left;  wide excision hidradenitis left axilla  . LAPAROSCOPIC VAGINAL HYSTERECTOMY WITH SALPINGECTOMY Bilateral 03/31/2019   Procedure: LAPAROSCOPIC ASSISTED VAGINAL HYSTERECTOMY WITH SALPINGECTOMY;  Surgeon: BovarJanyth Contes  [facility];  Laterality: Bilateral;  . THYROID LOBECTOMY Left 01/18/2014  . THYROIDECTOMY Left 01/18/2014   Procedure: LEFT THYROID LOBECTOMY;  Surgeon: DouglCoralie Keens  [facility];  Laterality: Left;    Family History  Problem Relation Age of Onset  . Hypertension Mother   . Multiple sclerosis Mother   . Diabetes Father   . Heart disease Father 55   3  AMI    Social History   Socioeconomic History  . Marital status: Single    Spouse name: Not on file  . Number of children: 2  . Years of education: Not on file  . Highest education level: Not on file  Occupational History    Comment: order processor  Tobacco Use  . Smoking status: Never Smoker  . Smokeless tobacco: Never Used  Vaping Use  . Vaping Use: Never used  Substance and Sexual Activity  . Alcohol use: Yes  Comment: 01/18/2014 "might have a drink @ birthday party or holidays; not all the time"  . Drug use: No  . Sexual activity: Not Currently    Birth control/protection: None, Surgical  Other Topics Concern  . Not on file  Social History Narrative   Marital status: single; dating seriously x 10 years; happy; no abuse.      Children:  (10 yo son, 3 y old boy).      Lives: with 2 sons,  boyfriend.      Employment:  [employer] x 15 years; work processor; pushing/pulling/walking      Tobacco: none      Alcohol: none      Drug: none   Social Determinants of Radio broadcast assistant Strain: Not on file  Food Insecurity: Not on file  Transportation Needs: Not on file  Physical Activity: Not on file  Stress: Not on file  Social Connections: Not on file  Intimate Partner Violence: Not on file    Outpatient Medications Prior to Visit  Medication Sig Dispense Refill  . ACCU-CHEK GUIDE test strip USE TO CHECK BLOOD SUGAR QID 100 each 5  . blood glucose meter kit and supplies Dispense based on patient and insurance preference. Use up to four times daily as directed. (FOR ICD-10 E10.9, E11.9). 1 each 0  . glipiZIDE (GLUCOTROL) 5 MG tablet Take 1 tablet (5 mg total) by mouth 2 (two) times daily before a meal. 180 tablet 1  . ibuprofen (ADVIL) 800 MG tablet Take 1 tablet (800 mg total) by mouth every 8 (eight) hours as needed (mild pain). 45 tablet 1  . Lancets MISC Check sugar once daily dx DMII 100 each 5  . lisinopril-hydrochlorothiazide (ZESTORETIC) 20-25 MG tablet Take 1 tablet by mouth at bedtime. 90 tablet 1  . oxyCODONE-acetaminophen (PERCOCET/ROXICET) 5-325 MG tablet Take 1-2 tablets by mouth every 6 (six) hours as needed for severe pain (moderate to severe pain (when tolerating fluids)). 20 tablet 0  . metFORMIN (GLUCOPHAGE) 500 MG tablet Take 1 tablet (500 mg total) by mouth 2 (two) times daily with a meal. 180 tablet 0   No facility-administered medications prior to visit.    Allergies  Allergen Reactions  . Adhesive [Tape] Other (See Comments)    TEARS SKIN  . Prevacid [Lansoprazole] Hives    ROS Review of Systems  Constitutional: Negative.   HENT: Negative.   Eyes: Negative.   Respiratory: Negative.   Cardiovascular: Negative.   Gastrointestinal: Negative.   Genitourinary: Negative.   Musculoskeletal: Negative.   Skin: Negative.    Neurological: Negative.   Psychiatric/Behavioral: Negative.   All other systems reviewed and are negative.     Objective:    Physical Exam Vitals and nursing note reviewed.  Constitutional:      General: She is not in acute distress.    Appearance: Normal appearance. She is normal weight. She is not ill-appearing, toxic-appearing or diaphoretic.  Cardiovascular:     Rate and Rhythm: Normal rate and regular rhythm.     Heart sounds: Normal heart sounds. No murmur heard. No friction rub. No gallop.   Pulmonary:     Effort: Pulmonary effort is normal. No respiratory distress.     Breath sounds: Normal breath sounds. No stridor. No wheezing, rhonchi or rales.  Chest:     Chest wall: No tenderness.  Skin:    General: Skin is warm and dry.  Neurological:     General: No focal deficit present.  Mental Status: She is alert and oriented to person, place, and time. Mental status is at baseline.  Psychiatric:        Mood and Affect: Mood normal.        Behavior: Behavior normal.        Thought Content: Thought content normal.        Judgment: Judgment normal.     BP (!) 171/101   Pulse 97   Temp 98 F (36.7 C) (Temporal)   Resp 18   Ht '5\' 9"'  (1.753 m)   Wt 233 lb 6.4 oz (105.9 kg)   SpO2 98%   BMI 34.47 kg/m  Wt Readings from Last 3 Encounters:  02/15/20 233 lb 6.4 oz (105.9 kg)  08/12/19 238 lb 12.1 oz (108.3 kg)  03/31/19 222 lb (100.7 kg)     Health Maintenance Due  Topic Date Due  . COVID-19 Vaccine (1) Never done  . FOOT EXAM  08/25/2018  . COLONOSCOPY (Pts 45-53yr Insurance coverage will need to be confirmed)  Never done    There are no preventive care reminders to display for this patient.  Lab Results  Component Value Date   TSH 0.835 01/21/2019   Lab Results  Component Value Date   WBC 10.0 08/12/2019   HGB 14.6 08/12/2019   HCT 43.0 08/12/2019   MCV 91.0 08/12/2019   PLT 324 08/12/2019   Lab Results  Component Value Date   NA 137  08/12/2019   K 3.3 (L) 08/12/2019   CO2 22 08/12/2019   GLUCOSE 175 (H) 08/12/2019   BUN 13 08/12/2019   CREATININE 0.60 08/12/2019   BILITOT 0.5 08/12/2019   ALKPHOS 84 08/12/2019   AST 28 08/12/2019   ALT 36 08/12/2019   PROT 7.2 08/12/2019   ALBUMIN 3.9 08/12/2019   CALCIUM 8.9 08/12/2019   ANIONGAP 12 08/12/2019   Lab Results  Component Value Date   CHOL 189 01/21/2019   Lab Results  Component Value Date   HDL 53 01/21/2019   Lab Results  Component Value Date   LDLCALC 124 (H) 01/21/2019   Lab Results  Component Value Date   TRIG 65 01/21/2019   Lab Results  Component Value Date   CHOLHDL 3.6 01/21/2019   Lab Results  Component Value Date   HGBA1C 7.8 (A) 02/15/2020      Assessment & Plan:   Problem List Items Addressed This Visit      Cardiovascular and Mediastinum   Essential hypertension (Chronic)   Relevant Medications   losartan (COZAAR) 100 MG tablet     Endocrine   Type 2 diabetes mellitus without complication, without long-term current use of insulin (HCC) - Primary (Chronic)   Relevant Medications   Dapagliflozin-sAXagliptin 10-5 MG TABS   losartan (COZAAR) 100 MG tablet   Other Relevant Orders   POCT glycosylated hemoglobin (Hb A1C) (Completed)    Other Visit Diagnoses    Flu vaccine need       Relevant Orders   Flu Vaccine QUAD 6+ mos PF IM (Fluarix Quad PF) (Completed)      Meds ordered this encounter  Medications  . Dapagliflozin-sAXagliptin 10-5 MG TABS    Sig: Take 1 tablet by mouth daily.    Dispense:  90 tablet    Refill:  1    Order Specific Question:   Supervising Provider    Answer:   GCarlota Raspberry JEFFREY R [[id number]]  . losartan (COZAAR) 100 MG tablet    Sig: Take 1 tablet (100  mg total) by mouth daily. Take 1/2 tablet (58m) by mouth for first week, then increase to 1 full tablet (1064m by mouth after that. Monitor home blood pressures.    Dispense:  90 tablet    Refill:  3    Order Specific Question:   Supervising  Provider    Answer:   GRCarlota RaspberryJEFFREY R [[id number]]    Follow-up: No follow-ups on file.   PLAN  Refill meds as patient has been out  Encourage better lifestyle control for better management of t2dm and htn  Return in 3 mo for check on chronic conditions  Patient encouraged to call clinic with any questions, comments, or concerns.  RiMaximiano CossNP

## 2020-05-12 ENCOUNTER — Other Ambulatory Visit: Payer: Self-pay | Admitting: Registered Nurse

## 2020-05-12 DIAGNOSIS — E119 Type 2 diabetes mellitus without complications: Secondary | ICD-10-CM

## 2020-05-12 DIAGNOSIS — I10 Essential (primary) hypertension: Secondary | ICD-10-CM

## 2020-05-18 ENCOUNTER — Ambulatory Visit: Payer: 59 | Admitting: Registered Nurse

## 2020-05-25 ENCOUNTER — Telehealth: Payer: Self-pay | Admitting: Registered Nurse

## 2020-05-25 ENCOUNTER — Ambulatory Visit: Payer: 59 | Admitting: Registered Nurse

## 2020-05-25 NOTE — Telephone Encounter (Signed)
Patient having trouble getting refill of   glipiZIDE (GLUCOTROL) 5 MG tablet from pharmacy [facility] [place], [place] - [place] AT [address]  [address] [place] [address]  Phone:  [phone] Fax:  [phone]  DEA #:  [id number]  Pharmacy giving her a hard time.  Patient has been out of this med for about 2 months. Patient requesting refill be called in today so she can pick it up after work. Please advise at [phone].

## 2020-05-25 NOTE — Telephone Encounter (Signed)
Called pt LVM for her to call back to reschedule appt that was cancelled today due to provider not available

## 2020-05-28 NOTE — Telephone Encounter (Signed)
Picked up 05/26/2020 per technician [place] at [employer]

## 2020-05-30 ENCOUNTER — Ambulatory Visit (INDEPENDENT_AMBULATORY_CARE_PROVIDER_SITE_OTHER): Payer: 59 | Admitting: Registered Nurse

## 2020-05-30 ENCOUNTER — Encounter: Payer: Self-pay | Admitting: Registered Nurse

## 2020-05-30 ENCOUNTER — Other Ambulatory Visit: Payer: Self-pay | Admitting: Registered Nurse

## 2020-05-30 ENCOUNTER — Other Ambulatory Visit: Payer: Self-pay

## 2020-05-30 VITALS — BP 192/100 | HR 110 | Temp 97.9°F | Ht 69.0 in | Wt 233.8 lb

## 2020-05-30 DIAGNOSIS — I10 Essential (primary) hypertension: Secondary | ICD-10-CM | POA: Diagnosis not present

## 2020-05-30 DIAGNOSIS — E119 Type 2 diabetes mellitus without complications: Secondary | ICD-10-CM

## 2020-05-30 LAB — POCT GLYCOSYLATED HEMOGLOBIN (HGB A1C): Hemoglobin A1C: 8.6 % — AB (ref 4.0–5.6)

## 2020-05-30 MED ORDER — LOSARTAN POTASSIUM-HCTZ 100-25 MG PO TABS: 1.0000 | ORAL_TABLET | Freq: Every day | ORAL | 0 refills | Status: AC

## 2020-05-30 NOTE — Patient Instructions (Signed)
° ° ° °  If you have lab work done today you will be contacted with your lab results within the next 2 weeks.  If you have not heard from us then please contact us. The fastest way to get your results is to register for My Chart. ° ° °IF you received an x-ray today, you will receive an invoice from [facility]. Please contact [facility] at [phone] with questions or concerns regarding your invoice.  ° °IF you received labwork today, you will receive an invoice from [facility]. Please contact [facility] at [phone] with questions or concerns regarding your invoice.  ° °Our billing staff will not be able to assist you with questions regarding bills from these companies. ° °You will be contacted with the lab results as soon as they are available. The fastest way to get your results is to activate your My Chart account. Instructions are located on the last page of this paperwork. If you have not heard from us regarding the results in 2 weeks, please contact this office. °  ° ° ° °

## 2020-05-30 NOTE — Telephone Encounter (Signed)
Requested Prescriptions  Pending Prescriptions Disp Refills  . metFORMIN (GLUCOPHAGE) 500 MG tablet [Pharmacy Med Name: METFORMIN 500MG TABLETS] 180 tablet 0    Sig: TAKE 1 TABLET(500 MG) BY MOUTH TWICE DAILY WITH A MEAL     Endocrinology:  Diabetes - Biguanides Passed - 05/30/2020  6:36 AM      Passed - Cr in normal range and within 360 days    Creat  Date Value Ref Range Status  03/18/2016 0.77 0.50 - 1.10 mg/dL Final   Creatinine, Ser  Date Value Ref Range Status  08/12/2019 0.60 0.44 - 1.00 mg/dL Final         Passed - HBA1C is between 0 and 7.9 and within 180 days    Hemoglobin A1C  Date Value Ref Range Status  02/15/2020 7.8 (A) 4.0 - 5.6 % Final   Hgb A1c MFr Bld  Date Value Ref Range Status  03/29/2019 7.9 (H) 4.8 - 5.6 % Final    Comment:    (NOTE) Pre diabetes:          5.7%-6.4% Diabetes:              >6.4% Glycemic control for   <7.0% adults with diabetes          Passed - AA eGFR in normal range and within 360 days    GFR calc Af Amer  Date Value Ref Range Status  08/12/2019 >60 >60 mL/min Final   GFR calc non Af Amer  Date Value Ref Range Status  08/12/2019 >60 >60 mL/min Final         Passed - Valid encounter within last 6 months    Recent Outpatient Visits          3 months ago Type 2 diabetes mellitus without complication, without long-term current use of insulin ([place])   [facility] at [facility], Delfino Lovett, NP   1 year ago Thyroid nodule   Primary Care at [facility], Zoe A, MD   1 year ago Type 2 diabetes mellitus without complication, without long-term current use of insulin [facility])   Primary Care at [facility], Lorelee Market, NP   2 years ago Essential hypertension   [facility], Arlie Solomons, MD   2 years ago Essential hypertension   Primary Care at [facility], Arlie Solomons, MD      Future Appointments            Today Maximiano Coss, NP Primary Care at [place], [facility]

## 2020-08-27 ENCOUNTER — Ambulatory Visit: Payer: Self-pay | Admitting: Registered Nurse

## 2020-08-28 ENCOUNTER — Other Ambulatory Visit: Payer: Self-pay | Admitting: Registered Nurse

## 2020-08-28 DIAGNOSIS — I10 Essential (primary) hypertension: Secondary | ICD-10-CM

## 2020-08-28 DIAGNOSIS — E119 Type 2 diabetes mellitus without complications: Secondary | ICD-10-CM

## 2020-11-26 ENCOUNTER — Other Ambulatory Visit: Payer: Self-pay | Admitting: Registered Nurse

## 2020-11-26 DIAGNOSIS — I10 Essential (primary) hypertension: Secondary | ICD-10-CM

## 2020-11-26 DIAGNOSIS — E119 Type 2 diabetes mellitus without complications: Secondary | ICD-10-CM

## 2021-01-21 ENCOUNTER — Ambulatory Visit (INDEPENDENT_AMBULATORY_CARE_PROVIDER_SITE_OTHER): Payer: 59

## 2021-01-21 ENCOUNTER — Other Ambulatory Visit: Payer: Self-pay

## 2021-01-21 ENCOUNTER — Encounter: Payer: Self-pay | Admitting: Emergency Medicine

## 2021-01-21 ENCOUNTER — Ambulatory Visit
Admission: EM | Admit: 2021-01-21 | Discharge: 2021-01-21 | Disposition: A | Discharge status: Home / Self Care | Payer: 59 | Attending: Urgent Care | Admitting: Urgent Care

## 2021-01-21 DIAGNOSIS — M25512 Pain in left shoulder: Secondary | ICD-10-CM

## 2021-01-21 DIAGNOSIS — S161XXA Strain of muscle, fascia and tendon at neck level, initial encounter: Secondary | ICD-10-CM | POA: Diagnosis not present

## 2021-01-21 DIAGNOSIS — E119 Type 2 diabetes mellitus without complications: Secondary | ICD-10-CM

## 2021-01-21 DIAGNOSIS — M542 Cervicalgia: Secondary | ICD-10-CM | POA: Diagnosis not present

## 2021-01-21 MED ORDER — TIZANIDINE HCL 4 MG PO TABS: 4.0000 mg | ORAL_TABLET | Freq: Every day | ORAL | 0 refills | Status: AC

## 2021-01-21 MED ORDER — NAPROXEN 500 MG PO TABS: 500.0000 mg | ORAL_TABLET | Freq: Two times a day (BID) | ORAL | 0 refills | Status: DC
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
## 2021-04-24 ENCOUNTER — Other Ambulatory Visit: Payer: Self-pay

## 2021-04-24 ENCOUNTER — Ambulatory Visit
Admission: EM | Admit: 2021-04-24 | Discharge: 2021-04-24 | Disposition: A | Discharge status: Home / Self Care | Payer: 59 | Attending: Physician Assistant | Admitting: Physician Assistant

## 2021-04-24 DIAGNOSIS — J019 Acute sinusitis, unspecified: Secondary | ICD-10-CM

## 2021-04-24 DIAGNOSIS — H1033 Unspecified acute conjunctivitis, bilateral: Secondary | ICD-10-CM

## 2021-04-24 MED ORDER — AMOXICILLIN-POT CLAVULANATE 875-125 MG PO TABS: 1.0000 | ORAL_TABLET | Freq: Two times a day (BID) | ORAL | 0 refills | Status: DC

## 2021-04-24 MED ORDER — POLYMYXIN B-TRIMETHOPRIM 10000-0.1 UNIT/ML-% OP SOLN: 1.0000 [drp] | OPHTHALMIC | 0 refills | Status: AC

## 2021-04-24 NOTE — ED Provider Notes (Signed)
[facility]    CSN: [id number] Arrival date & time: 04/24/21  1610      History   Chief Complaint Chief Complaint  Patient presents with   Nasal Congestion   Conjunctivitis    bilateral    HPI Yvonne Fisher is a 45 y.o. female.   Patient here today for evaluation of 2-week history of congestion and cough.  She reports that she has developed sinus pressure and a few days ago also developed eye redness and drainage bilaterally.  She states she has had some nosebleeds over the last several days as well.  She has been trying to use eyedrops without significant change.  She has taken COVID test at home that were negative.  She is also tried using Mucinex and Benadryl without significant relief.  The history is provided by the patient.  Conjunctivitis Pertinent negatives include no chest pain, no abdominal pain and no shortness of breath.   Past Medical History:  Diagnosis Date   Arthritis    right elbow    Diabetes ([place])    GERD (gastroesophageal reflux disease)    Gestational diabetes    diet controlled   Hidradenitis 11/2011   left axilla   Pregnancy induced hypertension 2015   "still keeping eye on it" (01/18/2014)   Rash 12/01/2011   bilat. axilla   S/P laparoscopic assisted vaginal hysterectomy (LAVH) 03/31/2019   Also B salpingectomy, cystoscopy   Thyroid nodule     Patient Active Problem List   Diagnosis Date Noted   S/P laparoscopic assisted vaginal hysterectomy (LAVH) 03/31/2019   Essential hypertension 07/23/2016   Type 2 diabetes mellitus without complication, without long-term current use of insulin ([place]) 03/26/2016   Class 2 severe obesity due to excess calories with serious comorbidity and body mass index (BMI) of 35.0 to 35.9 in adult [facility]) 03/26/2016   Thyroid nodule 01/18/2014   Hidradenitis axillaris 11/21/2011    Past Surgical History:  Procedure Laterality Date   CHOLECYSTECTOMY  04/15/2011   Procedure: LAPAROSCOPIC CHOLECYSTECTOMY  WITH INTRAOPERATIVE CHOLANGIOGRAM;  Surgeon: Adin Hector, MD;  [facility];  Service: General;  Laterality: N/A;  laparoscopic cholecystectomy with cholangiogram   CYSTOSCOPY N/A 03/31/2019   Procedure: CYSTOSCOPY;  Surgeon: Janyth Contes, MD;  Location: [place];  Service: Gynecology;  Laterality: N/A;   ELBOW ARTHROSCOPY  1997   right elbow   HYDRADENITIS EXCISION  12/05/2011   Procedure: EXCISION HYDRADENITIS AXILLA;  Surgeon: Harl Bowie, MD;  Location: [place];  Service: General;  Laterality: Left;  wide excision hidradenitis left axilla   LAPAROSCOPIC VAGINAL HYSTERECTOMY WITH SALPINGECTOMY Bilateral 03/31/2019   Procedure: LAPAROSCOPIC ASSISTED VAGINAL HYSTERECTOMY WITH SALPINGECTOMY;  Surgeon: Janyth Contes, MD;  Location: [place];  Service: Gynecology;  Laterality: Bilateral;   THYROID LOBECTOMY Left 01/18/2014   THYROIDECTOMY Left 01/18/2014   Procedure: LEFT THYROID LOBECTOMY;  Surgeon: Coralie Keens, MD;  Location: [place];  Service: General;  Laterality: Left;    OB History     Gravida  4   Para  2   Term  2   Preterm  0   AB  2   Living  2      SAB  2   IAB  0   Ectopic  0   Multiple  0   Live Births  2            Home Medications    Prior to Admission medications  Medication Sig Start Date End Date Taking? Authorizing Provider  amoxicillin-clavulanate (AUGMENTIN) 875-125 MG tablet Take 1 tablet by mouth every 12 (twelve) hours. 04/24/21  Yes Francene Finders, PA-C  trimethoprim-polymyxin b (POLYTRIM) ophthalmic solution Place 1 drop into both eyes every 4 (four) hours for 7 days. 04/24/21 05/01/21 Yes Francene Finders, PA-C  ACCU-CHEK GUIDE test strip USE TO CHECK BLOOD SUGAR QID 03/17/19   Wendall Mola, NP  blood glucose meter kit and supplies Dispense based on patient and insurance preference. Use up to four times daily as directed. (FOR ICD-10 E10.9, E11.9).  01/27/19   Wendall Mola, NP  glipiZIDE (GLUCOTROL) 5 MG tablet TAKE 1 TABLET(5 MG) BY MOUTH TWICE DAILY BEFORE A MEAL 11/26/20   Maximiano Coss, NP  Lancets MISC Check sugar once daily dx DMII 03/17/19   Wendall Mola, NP  losartan (COZAAR) 100 MG tablet Take 1 tablet (100 mg total) by mouth daily. Take 1/2 tablet (59m) by mouth for first week, then increase to 1 full tablet (10100m by mouth after that. Monitor home blood pressures. 02/15/20   MoMaximiano CossNP  losartan-hydrochlorothiazide (HYZAAR) 100-25 MG tablet Take 1 tablet by mouth daily. 05/30/20   MoMaximiano CossNP  metFORMIN (GLUCOPHAGE) 500 MG tablet TAKE 1 TABLET(500 MG) BY MOUTH TWICE DAILY WITH A MEAL 11/26/20   MoMaximiano CossNP  naproxen (NAPROSYN) 500 MG tablet Take 1 tablet (500 mg total) by mouth 2 (two) times daily with a meal. 01/21/21   MaJaynee EaglesPA-C  tiZANidine (ZANAFLEX) 4 MG tablet Take 1 tablet (4 mg total) by mouth at bedtime. 01/21/21   MaJaynee EaglesPA-C    Family History Family History  Problem Relation Age of Onset   Hypertension Mother    Multiple sclerosis Mother    Diabetes Father    Heart disease Father [id number]     AMI    Social History Social History   Tobacco Use   Smoking status: Never   Smokeless tobacco: Never  Vaping Use   Vaping Use: Never used  Substance Use Topics   Alcohol use: Yes    Comment: 01/18/2014 "might have a drink @ birthday party or holidays; not all the time"   Drug use: No     Allergies   Adhesive [tape] and Prevacid [lansoprazole]   Review of Systems Review of Systems  Constitutional:  Negative for chills and fever.  HENT:  Positive for congestion and sinus pressure. Negative for ear pain.   Eyes:  Positive for discharge and redness.  Respiratory:  Positive for cough. Negative for shortness of breath and wheezing.   Cardiovascular:  Negative for chest pain.  Gastrointestinal:  Negative for abdominal pain, diarrhea, nausea and vomiting.    Physical  Exam Triage Vital Signs ED Triage Vitals  Enc Vitals Group     BP 04/24/21 1723 (!) 186/119     Pulse Rate 04/24/21 1723 97     Resp 04/24/21 1723 18     Temp 04/24/21 1723 97.8 F (36.6 C)     Temp Source 04/24/21 1723 Oral     SpO2 04/24/21 1723 98 %     Weight --      Height --      Head Circumference --      Peak Flow --      Pain Score 04/24/21 1726 0     Pain Loc --      Pain Edu? --      Excl.  in [place]? --    No data found.  Updated Vital Signs BP (!) 186/119 (BP Location: Right Arm) Comment: No BP meds taken today   Pulse 97    Temp 97.8 F (36.6 C) (Oral)    Resp 18    LMP 03/31/2019    SpO2 98%   Visual Acuity Right Eye Distance: 20/20 Left Eye Distance: 20/15 Bilateral Distance: 20/13 (Pt wearing glasses during exam)      Physical Exam Vitals and nursing note reviewed.  Constitutional:      General: She is not in acute distress.    Appearance: Normal appearance. She is not ill-appearing.  HENT:     Head: Normocephalic and atraumatic.     Nose: Congestion present.     Mouth/Throat:     Mouth: Mucous membranes are moist.     Pharynx: Oropharynx is clear. No oropharyngeal exudate or posterior oropharyngeal erythema.  Eyes:     Comments: Bilateral conjunctiva mildly injected  Neurological:     Mental Status: She is alert.     UC Treatments / Results  Labs (all labs ordered are listed, but only abnormal results are displayed) Labs Reviewed - No data to display  EKG   Radiology No results found.  Procedures Procedures (including critical care time)  Medications Ordered in UC Medications - No data to display  Initial Impression / Assessment and Plan / UC Course  I have reviewed the triage vital signs and the nursing notes.  Pertinent labs & imaging results that were available during my care of the patient were reviewed by me and considered in my medical decision making (see chart for details).    Augmentin prescribed for sinusitis.   Antibiotic drops also prescribed to cover conjunctivitis.  Recommended follow-up if symptoms fail to improve or worsen.  Final Clinical Impressions(s) / UC Diagnoses   Final diagnoses:  Acute sinusitis, recurrence not specified, unspecified location  Acute conjunctivitis of both eyes, unspecified acute conjunctivitis type   Discharge Instructions   None    ED Prescriptions     Medication Sig Dispense Auth. Provider   amoxicillin-clavulanate (AUGMENTIN) 875-125 MG tablet Take 1 tablet by mouth every 12 (twelve) hours. 14 tablet Ewell Poe F, PA-C   trimethoprim-polymyxin b (POLYTRIM) ophthalmic solution Place 1 drop into both eyes every 4 (four) hours for 7 days. 10 mL Francene Finders, PA-C      PDMP not reviewed this encounter.   Francene Finders, PA-C 04/24/21 1831

## 2021-04-24 NOTE — ED Triage Notes (Signed)
Two weeks of nasal congestion with some cough. A few days of bilateral eye redness and drainage. Approx 3 days of mulitple episodes of epistaxis. Has been using eyedrops without a difference in sxs. Negative at home covid tests. Also taking mucinex and benadryl without relief. No v/d. No decrease in visual acuity.

## 2021-05-20 NOTE — Progress Notes (Signed)
Established Patient Office Visit  Subjective:  Patient ID: Yvonne Fisher, female    DOB: 03-12-1976  Age: 46 y.o. MRN: [id number]  CC:  Chief Complaint  Patient presents with   Diabetes    F/u     HPI Yvonne Fisher presents for t2dm, htn  Hypertension: Patient Currently taking: losartan 176m po qd subtherapeutic effect. No AEs. Denies CV symptoms including: chest pain, shob, doe, headache, visual changes, fatigue, claudication, and dependent edema.   Previous readings and labs: BP Readings from Last 3 Encounters:  04/24/21 (!) 186/119  01/21/21 (!) 170/97  05/30/20 (!) 192/100   Lab Results  Component Value Date   CREATININE 0.60 08/12/2019     T2dm Last A1c:  Lab Results  Component Value Date   HGBA1C 8.6 (A) 05/30/2020    Currently taking: glipizide 513mpo bid ac, metformin 50068mo bid ac No new complications Reports good compliance with medications Diet has been steady Exercise habits have been limited   Past Medical History:  Diagnosis Date   Arthritis    right elbow    [place]  GERD (gastroesophageal reflux disease)    Gestational diabetes    diet controlled   Hidradenitis 11/2011   left axilla   Pregnancy induced hypertension 2015   "still keeping eye on it" (01/18/2014)   Rash 12/01/2011   bilat. axilla   S/P laparoscopic assisted vaginal hysterectomy (LAVH) 03/31/2019   Also B salpingectomy, cystoscopy   Thyroid nodule     Past Surgical History:  Procedure Laterality Date   CHOLECYSTECTOMY  04/15/2011   Procedure: LAPAROSCOPIC CHOLECYSTECTOMY WITH INTRAOPERATIVE CHOLANGIOGRAM;  Surgeon: HayAdin HectorD;  [facility];  Service: General;  Laterality: N/A;  laparoscopic cholecystectomy with cholangiogram   CYSTOSCOPY N/A 03/31/2019   Procedure: CYSTOSCOPY;  Surgeon: BovJanyth ContesD;  [place] [place];  Laterality: N/A;   ELBOW ARTHROSCOPY  1997   right elbow    HYDRADENITIS EXCISION  12/05/2011   Procedure: EXCISION HYDRADENITIS AXILLA;  Surgeon: DouHarl BowieD;  [place];  Laterality: Left;  wide excision hidradenitis left axilla   LAPAROSCOPIC VAGINAL HYSTERECTOMY WITH SALPINGECTOMY Bilateral 03/31/2019   Procedure: LAPAROSCOPIC ASSISTED VAGINAL HYSTERECTOMY WITH SALPINGECTOMY;  Surgeon: BovJanyth ContesD;  [place] [place];  Laterality: Bilateral;   THYROID LOBECTOMY Left 01/18/2014   THYROIDECTOMY Left 01/18/2014   Procedure: LEFT THYROID LOBECTOMY;  Surgeon: DouCoralie KeensD;  [facility];  Laterality: Left;    Family History  Problem Relation Age of Onset   Hypertension Mother    Multiple sclerosis Mother    Diabetes Father    Heart disease Father 55 4    AMI    Social History   Socioeconomic History   Marital status: Single    Spouse name: Not on file   Number of children: 2   Years of education: Not on file   Highest education level: Not on file  Occupational History    Comment: order processor  Tobacco Use   Smoking status: Never   Smokeless tobacco: Never  Vaping Use   Vaping Use: Never used  Substance and Sexual Activity   Alcohol use: Yes    Comment: 01/18/2014 "might have a drink @ birthday party or holidays; not all the time"   Drug use: No   Sexual activity: Not Currently  Birth control/protection: None, Surgical  Other Topics Concern   Not on file  Social History Narrative   Marital status: single; dating seriously x 10 years; happy; no abuse.      Children:  (36 yo son, 28 y old boy).      Lives: with 2 sons, boyfriend.      Employment:  [employer] x 15 years; work processor; pushing/pulling/walking      Tobacco: none      Alcohol: none      Drug: none   Social Determinants of Radio broadcast assistant Strain: Not on file  Food Insecurity: Not on file   Transportation Needs: Not on file  Physical Activity: Not on file  Stress: Not on file  Social Connections: Not on file  Intimate Partner Violence: Not on file    Outpatient Medications Prior to Visit  Medication Sig Dispense Refill   ACCU-CHEK GUIDE test strip USE TO CHECK BLOOD SUGAR QID 100 each 5   blood glucose meter kit and supplies Dispense based on patient and insurance preference. Use up to four times daily as directed. (FOR ICD-10 E10.9, E11.9). 1 each 0   Lancets MISC Check sugar once daily dx DMII 100 each 5   losartan (COZAAR) 100 MG tablet Take 1 tablet (100 mg total) by mouth daily. Take 1/2 tablet (36m) by mouth for first week, then increase to 1 full tablet (1068m by mouth after that. Monitor home blood pressures. 90 tablet 3   glipiZIDE (GLUCOTROL) 5 MG tablet TAKE 1 TABLET(5 MG) BY MOUTH TWICE DAILY BEFORE A MEAL 180 tablet 1   ibuprofen (ADVIL) 800 MG tablet Take 1 tablet (800 mg total) by mouth every 8 (eight) hours as needed (mild pain). 45 tablet 1   metFORMIN (GLUCOPHAGE) 500 MG tablet TAKE 1 TABLET(500 MG) BY MOUTH TWICE DAILY WITH A MEAL 180 tablet 0   Dapagliflozin-sAXagliptin 10-5 MG TABS Take 1 tablet by mouth daily. 90 tablet 1   lisinopril-hydrochlorothiazide (ZESTORETIC) 20-25 MG tablet Take 1 tablet by mouth at bedtime. 90 tablet 1   oxyCODONE-acetaminophen (PERCOCET/ROXICET) 5-325 MG tablet Take 1-2 tablets by mouth every 6 (six) hours as needed for severe pain (moderate to severe pain (when tolerating fluids)). 20 tablet 0   No facility-administered medications prior to visit.    Allergies  Allergen Reactions   Adhesive [Tape] Other (See Comments)    TEARS SKIN   Prevacid [Lansoprazole] Hives    ROS Review of Systems  Constitutional: Negative.   HENT: Negative.    Eyes: Negative.   Respiratory: Negative.    Cardiovascular: Negative.   Gastrointestinal: Negative.   Genitourinary: Negative.   Musculoskeletal: Negative.   Skin: Negative.    Neurological: Negative.   Psychiatric/Behavioral: Negative.    All other systems reviewed and are negative.    Objective:    Physical Exam Vitals and nursing note reviewed.  Constitutional:      General: She is not in acute distress.    Appearance: Normal appearance. She is normal weight. She is not ill-appearing, toxic-appearing or diaphoretic.  Cardiovascular:     Rate and Rhythm: Normal rate and regular rhythm.     Heart sounds: Normal heart sounds. No murmur heard.   No friction rub. No gallop.  Pulmonary:     Effort: Pulmonary effort is normal. No respiratory distress.     Breath sounds: Normal breath sounds. No stridor. No wheezing, rhonchi or rales.  Chest:     Chest wall: No tenderness.  Skin:    General: Skin is warm and dry.  Neurological:     General: No focal deficit present.     Mental Status: She is alert and oriented to person, place, and time. Mental status is at baseline.  Psychiatric:        Mood and Affect: Mood normal.        Behavior: Behavior normal.        Thought Content: Thought content normal.        Judgment: Judgment normal.    BP (!) 192/100    Pulse (!) 110    Temp 97.9 F (36.6 C) (Temporal)    Ht '5\' 9"'  (1.753 m)    Wt 233 lb 12.8 oz (106.1 kg)    LMP 03/31/2019    SpO2 100%    BMI 34.53 kg/m  Wt Readings from Last 3 Encounters:  05/30/20 233 lb 12.8 oz (106.1 kg)  02/15/20 233 lb 6.4 oz (105.9 kg)  08/12/19 238 lb 12.1 oz (108.3 kg)     Health Maintenance Due  Topic Date Due   COVID-19 Vaccine (1) Never done   Hepatitis C Screening  Never done   FOOT EXAM  08/25/2018   COLONOSCOPY (Pts 45-13yr Insurance coverage will need to be confirmed)  Never done   OPHTHALMOLOGY EXAM  11/06/2020   INFLUENZA VACCINE  11/26/2020   HEMOGLOBIN A1C  11/27/2020   PAP SMEAR-Modifier  02/19/2021    There are no preventive care reminders to display for this patient.  Lab Results  Component Value Date   TSH 0.835 01/21/2019   Lab Results   Component Value Date   WBC 10.0 08/12/2019   HGB 14.6 08/12/2019   HCT 43.0 08/12/2019   MCV 91.0 08/12/2019   PLT 324 08/12/2019   Lab Results  Component Value Date   NA 137 08/12/2019   K 3.3 (L) 08/12/2019   CO2 22 08/12/2019   GLUCOSE 175 (H) 08/12/2019   BUN 13 08/12/2019   CREATININE 0.60 08/12/2019   BILITOT 0.5 08/12/2019   ALKPHOS 84 08/12/2019   AST 28 08/12/2019   ALT 36 08/12/2019   PROT 7.2 08/12/2019   ALBUMIN 3.9 08/12/2019   CALCIUM 8.9 08/12/2019   ANIONGAP 12 08/12/2019   Lab Results  Component Value Date   CHOL 189 01/21/2019   Lab Results  Component Value Date   HDL 53 01/21/2019   Lab Results  Component Value Date   LDLCALC 124 (H) 01/21/2019   Lab Results  Component Value Date   TRIG 65 01/21/2019   Lab Results  Component Value Date   CHOLHDL 3.6 01/21/2019   Lab Results  Component Value Date   HGBA1C 8.6 (A) 05/30/2020      Assessment & Plan:   Problem List Items Addressed This Visit       Cardiovascular and Mediastinum   Essential hypertension (Chronic)   Relevant Medications   losartan-hydrochlorothiazide (HYZAAR) 100-25 MG tablet     Endocrine   Type 2 diabetes mellitus without complication, without long-term current use of insulin (HCC) - Primary (Chronic)   Relevant Medications   losartan-hydrochlorothiazide (HYZAAR) 100-25 MG tablet   Other Relevant Orders   POCT glycosylated hemoglobin (Hb A1C) (Completed)    Meds ordered this encounter  Medications   losartan-hydrochlorothiazide (HYZAAR) 100-25 MG tablet    Sig: Take 1 tablet by mouth daily.    Dispense:  90 tablet    Refill:  0    Order Specific Question:  Supervising Provider    Answer:   Carlota Raspberry, JEFFREY R [[id number]]    Follow-up: No follow-ups on file.   PLAN Increase antihypertensives. Losartan-hctz 100-64m po qd Bp check nurse visit in 2 weeks. May have to add additional agent if still poorly controlled. Today's a1c above range. Will hold on  increase to therapy as pt wants to initiate lifestyle modifications Return in 3 mo for recheck Patient encouraged to call clinic with any questions, comments, or concerns.  RMaximiano Coss NP

## 2021-07-09 ENCOUNTER — Other Ambulatory Visit: Payer: Self-pay | Admitting: Registered Nurse

## 2021-07-09 DIAGNOSIS — I10 Essential (primary) hypertension: Secondary | ICD-10-CM

## 2021-07-09 DIAGNOSIS — E119 Type 2 diabetes mellitus without complications: Secondary | ICD-10-CM

## 2022-04-17 DIAGNOSIS — R490 Dysphonia: Secondary | ICD-10-CM | POA: Insufficient documentation

## 2022-04-17 DIAGNOSIS — E041 Nontoxic single thyroid nodule: Secondary | ICD-10-CM | POA: Insufficient documentation

## 2022-09-10 DIAGNOSIS — E1165 Type 2 diabetes mellitus with hyperglycemia: Secondary | ICD-10-CM | POA: Insufficient documentation

## 2023-03-18 ENCOUNTER — Ambulatory Visit
Admission: EM | Admit: 2023-03-18 | Discharge: 2023-03-18 | Disposition: A | Discharge status: Home / Self Care | Payer: 59 | Attending: Family Medicine | Admitting: Family Medicine

## 2023-03-18 ENCOUNTER — Other Ambulatory Visit: Payer: Self-pay

## 2023-03-18 ENCOUNTER — Encounter: Payer: Self-pay | Admitting: *Deleted

## 2023-03-18 DIAGNOSIS — L089 Local infection of the skin and subcutaneous tissue, unspecified: Secondary | ICD-10-CM | POA: Diagnosis not present

## 2023-03-18 DIAGNOSIS — B9689 Other specified bacterial agents as the cause of diseases classified elsewhere: Secondary | ICD-10-CM | POA: Diagnosis not present

## 2023-03-18 MED ORDER — HYDROCODONE-ACETAMINOPHEN 5-325 MG PO TABS: 1.0000 | ORAL_TABLET | Freq: Four times a day (QID) | ORAL | 0 refills | Status: AC | PRN

## 2023-03-18 MED ORDER — DOXYCYCLINE HYCLATE 100 MG PO CAPS: 100.0000 mg | ORAL_CAPSULE | Freq: Two times a day (BID) | ORAL | 0 refills | Status: AC

## 2023-03-18 NOTE — ED Triage Notes (Signed)
Pt notes boil to L axillary area x4 days.

## 2023-03-18 NOTE — Discharge Instructions (Signed)
Be aware, you have been prescribed pain medications that may cause drowsiness. While taking this medication, do not take any other medications containing acetaminophen (Tylenol). Do not combine with alcohol or recreational drugs. Please do not drive, operate heavy machinery, or take part in activities that require making important decisions while on this medication as your judgement may be clouded.  

## 2023-03-19 NOTE — ED Provider Notes (Signed)
[facility]   [id number] 03/18/23 Arrival Time: 1354  ASSESSMENT & PLAN:  1. Localized bacterial skin infection    Skin of L axilla still soft without identifiable abscess. Do not recommend I&D at this time. Trial of antibiotic first. Will see how she does over next 48-72 hours.   Meds ordered this encounter  Medications   doxycycline (VIBRAMYCIN) 100 MG capsule    Sig: Take 1 capsule (100 mg total) by mouth 2 (two) times daily.    Dispense:  14 capsule    Refill:  0   HYDROcodone-acetaminophen (NORCO/VICODIN) 5-325 MG tablet    Sig: Take 1 tablet by mouth every 6 (six) hours as needed for moderate pain (pain score 4-6) or severe pain (pain score 7-10).    Dispense:  10 tablet    Refill:  0   [place] Controlled Substances Registry consulted for this patient. I feel the risk/benefit ratio today is favorable for proceeding with this prescription for a controlled substance. Medication sedation precautions given.   Reviewed expectations re: course of current medical issues. Questions answered. Outlined signs and symptoms indicating need for more acute intervention. Patient verbalized understanding. After Visit Summary given.   SUBJECTIVE:  Yvonne Fisher is a 47 y.o. female who presents with a possible infection of her L axilla; area tender over past 3-4 days; painful now. Denies drainage/bleeding. H/O similar.  OBJECTIVE:  Vitals:   03/18/23 1543  BP: (!) 171/98  Pulse: 96  Resp: 18  Temp: 98.2 F (36.8 C)  TempSrc: Oral  SpO2: 97%     General appearance: alert; no distress L axilla: soft but tender skin; slight skin thickening; no active drainage or bleeding Psychological: alert and cooperative; normal mood and affect  Allergies  Allergen Reactions   Adhesive [Tape] Other (See Comments)    TEARS SKIN   Prevacid [Lansoprazole] Hives    Past Medical History:  Diagnosis Date   Arthritis    right elbow    Diabetes (HCC)    GERD (gastroesophageal  reflux disease)    Gestational diabetes    diet controlled   Hidradenitis 11/2011   left axilla   Pregnancy induced hypertension 2015   "still keeping eye on it" (01/18/2014)   Rash 12/01/2011   bilat. axilla   S/P laparoscopic assisted vaginal hysterectomy (LAVH) 03/31/2019   Also B salpingectomy, cystoscopy   Thyroid nodule    Social History   Socioeconomic History   Marital status: Single    Spouse name: Not on file   Number of children: 2   Years of education: Not on file   Highest education level: Not on file  Occupational History    Comment: order processor  Tobacco Use   Smoking status: Never   Smokeless tobacco: Never  Vaping Use   Vaping status: Never Used  Substance and Sexual Activity   Alcohol use: Yes    Comment: 01/18/2014 "might have a drink @ birthday party or holidays; not all the time"   Drug use: No   Sexual activity: Not Currently    Birth control/protection: None, Surgical  Other Topics Concern   Not on file  Social History Narrative   Marital status: single; dating seriously x 10 years; happy; no abuse.      Children:  (76 yo son, 16 y old boy).      Lives: with 2 sons, boyfriend.      Employment:  [employer] x 15 years; work processor; pushing/pulling/walking  Tobacco: none      Alcohol: none      Drug: none   Social Determinants of Health   Financial Resource Strain: Not on file  Food Insecurity: Low Risk  (12/04/2022)   Received from [facility]   Hunger Vital Sign    Worried About Running Out of Food in the Last Year: Never true    Ran Out of Food in the Last Year: Never true  Transportation Needs: Not on file (12/04/2022)  Physical Activity: Not on file  Stress: Not on file  Social Connections: Not on file   Family History  Problem Relation Age of Onset   Hypertension Mother    Multiple sclerosis Mother    Diabetes Father    Heart disease Father 37       AMI   Past Surgical History:  Procedure Laterality Date    CHOLECYSTECTOMY  04/15/2011   Procedure: LAPAROSCOPIC CHOLECYSTECTOMY WITH INTRAOPERATIVE CHOLANGIOGRAM;  Surgeon: Ernestene Mention, MD;  [facility];  Service: General;  Laterality: N/A;  laparoscopic cholecystectomy with cholangiogram   CYSTOSCOPY N/A 03/31/2019   Procedure: CYSTOSCOPY;  Surgeon: Sherian Rein, MD;  Location: [facility];  Service: Gynecology;  Laterality: N/A;   ELBOW ARTHROSCOPY  1997   right elbow   HYDRADENITIS EXCISION  12/05/2011   Procedure: EXCISION HYDRADENITIS AXILLA;  Surgeon: Shelly Rubenstein, MD;  Location: [facility];  Service: General;  Laterality: Left;  wide excision hidradenitis left axilla   LAPAROSCOPIC VAGINAL HYSTERECTOMY WITH SALPINGECTOMY Bilateral 03/31/2019   Procedure: LAPAROSCOPIC ASSISTED VAGINAL HYSTERECTOMY WITH SALPINGECTOMY;  Surgeon: Sherian Rein, MD;  Location: [facility];  Service: Gynecology;  Laterality: Bilateral;   THYROID LOBECTOMY Left 01/18/2014   THYROIDECTOMY Left 01/18/2014   Procedure: LEFT THYROID LOBECTOMY;  Surgeon: Abigail Miyamoto, MD;  Location: [facility];  Service: General;  Laterality: Left;            Mardella Layman, MD 03/19/23 1025

## 2023-03-20 ENCOUNTER — Ambulatory Visit
Admission: RE | Admit: 2023-03-20 | Discharge: 2023-03-20 | Disposition: A | Discharge status: Home / Self Care | Payer: 59 | Source: Ambulatory Visit

## 2023-03-20 VITALS — BP 143/85 | HR 98 | Temp 97.6°F | Resp 18 | Ht 69.0 in | Wt 233.9 lb

## 2023-03-20 DIAGNOSIS — L0291 Cutaneous abscess, unspecified: Secondary | ICD-10-CM | POA: Diagnosis not present

## 2023-03-20 MED ORDER — NAPROXEN 500 MG PO TABS: 500.0000 mg | ORAL_TABLET | Freq: Two times a day (BID) | ORAL | 0 refills | Status: AC

## 2023-03-20 NOTE — Discharge Instructions (Addendum)
  Continue Doxycycline. Please follow up in emergency department with any worsening symptoms or if there is no improvement over the weekend.

## 2023-03-20 NOTE — ED Triage Notes (Signed)
Recheck to see to lance boil under the left underarm - Entered by patient

## 2023-03-28 NOTE — ED Provider Notes (Signed)
[facility]    CSN: [id number] Arrival date & time: 03/20/23  1053      History   Chief Complaint Chief Complaint  Patient presents with   Follow-up    HPI Yvonne Fisher is a 47 y.o. female.   Patient here today for evaluation of abscess to her left axillary area.  She reports that she was seen recently and treated with antibiotics but has not had resolution.  The history is provided by the patient.    Past Medical History:  Diagnosis Date   Arthritis    right elbow    Diabetes (HCC)    GERD (gastroesophageal reflux disease)    Gestational diabetes    diet controlled   Hidradenitis 11/2011   left axilla   Pregnancy induced hypertension 2015   "still keeping eye on it" (01/18/2014)   Rash 12/01/2011   bilat. axilla   S/P laparoscopic assisted vaginal hysterectomy (LAVH) 03/31/2019   Also B salpingectomy, cystoscopy   Thyroid nodule     Patient Active Problem List   Diagnosis Date Noted   Diabetes mellitus with hyperglycemia (HCC) 09/10/2022   Hoarse 04/17/2022   Right thyroid nodule 04/17/2022   S/P laparoscopic assisted vaginal hysterectomy (LAVH) 03/31/2019   Essential hypertension 07/23/2016   Type 2 diabetes mellitus without complication, without long-term current use of insulin (HCC) 03/26/2016   Class 2 severe obesity due to excess calories with serious comorbidity and body mass index (BMI) of 35.0 to 35.9 in adult (HCC) 03/26/2016   Thyroid nodule 01/18/2014   Hidradenitis axillaris 11/21/2011    Past Surgical History:  Procedure Laterality Date   CHOLECYSTECTOMY  04/15/2011   Procedure: LAPAROSCOPIC CHOLECYSTECTOMY WITH INTRAOPERATIVE CHOLANGIOGRAM;  Surgeon: Ernestene Mention, MD;  Location: [facility];  Service: General;  Laterality: N/A;  laparoscopic cholecystectomy with cholangiogram   CYSTOSCOPY N/A 03/31/2019   Procedure: CYSTOSCOPY;  Surgeon: Sherian Rein, MD;  Location: [facility];  Service: Gynecology;   Laterality: N/A;   ELBOW ARTHROSCOPY  1997   right elbow   HYDRADENITIS EXCISION  12/05/2011   Procedure: EXCISION HYDRADENITIS AXILLA;  Surgeon: Shelly Rubenstein, MD;  Location: [facility];  Service: General;  Laterality: Left;  wide excision hidradenitis left axilla   LAPAROSCOPIC VAGINAL HYSTERECTOMY WITH SALPINGECTOMY Bilateral 03/31/2019   Procedure: LAPAROSCOPIC ASSISTED VAGINAL HYSTERECTOMY WITH SALPINGECTOMY;  Surgeon: Sherian Rein, MD;  Location: [facility];  Service: Gynecology;  Laterality: Bilateral;   THYROID LOBECTOMY Left 01/18/2014   THYROIDECTOMY Left 01/18/2014   Procedure: LEFT THYROID LOBECTOMY;  Surgeon: Abigail Miyamoto, MD;  Location: [facility];  Service: General;  Laterality: Left;    OB History     Gravida  4   Para  2   Term  2   Preterm  0   AB  2   Living  2      SAB  2   IAB  0   Ectopic  0   Multiple  0   Live Births  2            Home Medications    Prior to Admission medications   Medication Sig Start Date End Date Taking? Authorizing Provider  ACCU-CHEK GUIDE test strip USE TO CHECK BLOOD SUGAR QID 03/17/19   Royal Hawthorn, NP  amLODipine (NORVASC) 10 MG tablet Take 10 mg by mouth daily. 06/13/22   [provider]  blood glucose meter kit and supplies Dispense based on patient and insurance  preference. Use up to four times daily as directed. (FOR ICD-10 E10.9, E11.9). 01/27/19   Royal Hawthorn, NP  clindamycin (CLEOCIN T) 1 % external solution Apply 1 Application topically 2 (two) times daily. 11/18/22   [provider]  doxycycline (VIBRAMYCIN) 100 MG capsule Take 1 capsule (100 mg total) by mouth 2 (two) times daily. 03/18/23   Mardella Layman, MD  doxycycline (VIBRAMYCIN) 100 MG capsule Take 100 mg by mouth 2 (two) times daily. 11/18/22   [provider]  glipiZIDE (GLUCOTROL XL) 10 MG 24 hr tablet Take 1 tablet by mouth daily. 03/12/22   [provider]   glipiZIDE (GLUCOTROL) 5 MG tablet TAKE 1 TABLET(5 MG) BY MOUTH TWICE DAILY BEFORE A MEAL 11/26/20   Janeece Agee, NP  HYDROcodone-acetaminophen (NORCO/VICODIN) 5-325 MG tablet Take 1 tablet by mouth every 6 (six) hours as needed for moderate pain (pain score 4-6) or severe pain (pain score 7-10). 03/18/23   Mardella Layman, MD  Lancets MISC Check sugar once daily dx DMII 03/17/19   Royal Hawthorn, NP  losartan (COZAAR) 100 MG tablet Take 1 tablet (100 mg total) by mouth daily. Take 1/2 tablet (50mg ) by mouth for first week, then increase to 1 full tablet (100mg ) by mouth after that. Monitor home blood pressures. 02/15/20   Janeece Agee, NP  losartan-hydrochlorothiazide (HYZAAR) 100-12.5 MG tablet Take 1 tablet by mouth daily. 06/11/22   [provider]  losartan-hydrochlorothiazide (HYZAAR) 100-25 MG tablet Take 1 tablet by mouth daily. 05/30/20   Janeece Agee, NP  metFORMIN (GLUCOPHAGE) 1000 MG tablet Take 1,000 mg by mouth 2 (two) times daily.    [provider]  metFORMIN (GLUCOPHAGE) 500 MG tablet TAKE 1 TABLET(500 MG) BY MOUTH TWICE DAILY WITH A MEAL 11/26/20   Janeece Agee, NP  naproxen (NAPROSYN) 500 MG tablet Take 1 tablet (500 mg total) by mouth 2 (two) times daily with a meal. 03/20/23   Tomi Bamberger, PA-C  Semaglutide 7 MG TABS Take 7 mg by mouth daily at 6 (six) AM. 12/16/22   [provider]  tiZANidine (ZANAFLEX) 4 MG tablet Take 1 tablet (4 mg total) by mouth at bedtime. Patient not taking: Reported on 03/18/2023 01/21/21   Wallis Bamberg, PA-C    Family History Family History  Problem Relation Age of Onset   Hypertension Mother    Multiple sclerosis Mother    Diabetes Father    Heart disease Father 15       AMI    Social History Social History   Tobacco Use   Smoking status: Never   Smokeless tobacco: Never  Vaping Use   Vaping status: Never Used  Substance Use Topics   Alcohol use: Not Currently    Comment: 01/18/2014 "might have a  drink @ birthday party or holidays; not all the time"   Drug use: No     Allergies   Prevacid [lansoprazole], Tape, Wound dressing adhesive, and Doxycycline   Review of Systems Review of Systems  Constitutional:  Negative for chills and fever.  Eyes:  Negative for discharge and redness.  Respiratory:  Negative for shortness of breath.   Gastrointestinal:  Negative for abdominal pain, nausea and vomiting.  Skin:  Positive for color change.     Physical Exam Triage Vital Signs ED Triage Vitals  Encounter Vitals Group     BP 03/20/23 1141 (!) 143/85     Systolic BP Percentile --      Diastolic BP Percentile --  Pulse Rate 03/20/23 1141 98     Resp 03/20/23 1141 18     Temp 03/20/23 1141 97.6 F (36.4 C)     Temp Source 03/20/23 1141 Oral     SpO2 03/20/23 1141 96 %     Weight 03/20/23 1139 233 lb 14.5 oz (106.1 kg)     Height 03/20/23 1139 5\' 9"  (1.753 m)     Head Circumference --      Peak Flow --      Pain Score 03/20/23 1137 10     Pain Loc --      Pain Education --      Exclude from Growth Chart --    No data found.  Updated Vital Signs BP (!) 143/85 (BP Location: Right Arm)   Pulse 98   Temp 97.6 F (36.4 C) (Oral)   Resp 18   Ht 5\' 9"  (1.753 m)   Wt 233 lb 14.5 oz (106.1 kg)   LMP 03/31/2019   SpO2 96%   BMI 34.54 kg/m   Visual Acuity Right Eye Distance:   Left Eye Distance:   Bilateral Distance:    Right Eye Near:   Left Eye Near:    Bilateral Near:     Physical Exam Vitals and nursing note reviewed.  Constitutional:      General: She is not in acute distress.    Appearance: Normal appearance. She is not ill-appearing.  HENT:     Head: Normocephalic and atraumatic.  Eyes:     Conjunctiva/sclera: Conjunctivae normal.  Cardiovascular:     Rate and Rhythm: Normal rate.  Pulmonary:     Effort: Pulmonary effort is normal. No respiratory distress.  Skin:    Comments: Small area of induration and erythema with no fluctuance or drainable  abscess noted to left axillary area  Neurological:     Mental Status: She is alert.  Psychiatric:        Mood and Affect: Mood normal.        Behavior: Behavior normal.        Thought Content: Thought content normal.      UC Treatments / Results  Labs (all labs ordered are listed, but only abnormal results are displayed) Labs Reviewed - No data to display  EKG   Radiology No results found.  Procedures Procedures (including critical care time)  Medications Ordered in UC Medications - No data to display  Initial Impression / Assessment and Plan / UC Course  I have reviewed the triage vital signs and the nursing notes.  Pertinent labs & imaging results that were available during my care of the patient were reviewed by me and considered in my medical decision making (see chart for details).    Recommended patient continue doxycycline as there is no drainable abscess today.  Recommended further evaluation Emergency Department if she continues to have pain.  Final Clinical Impressions(s) / UC Diagnoses   Final diagnoses:  Abscess     Discharge Instructions       Continue Doxycycline. Please follow up in emergency department with any worsening symptoms or if there is no improvement over the weekend.   ED Prescriptions     Medication Sig Dispense Auth. Provider   naproxen (NAPROSYN) 500 MG tablet Take 1 tablet (500 mg total) by mouth 2 (two) times daily with a meal. 30 tablet Tomi Bamberger, PA-C      PDMP not reviewed this encounter.   Tomi Bamberger, PA-C 03/28/23 [phone]
